# Patient Record
Sex: Female | Born: 1935 | Race: Black or African American | Hispanic: No | Smoking: Never smoker
Health system: Southern US, Community
[De-identification: ages and names within clinical notes are randomized; demographics above are authoritative.]

## PROBLEM LIST (undated history)

## (undated) DIAGNOSIS — E039 Hypothyroidism, unspecified: Secondary | ICD-10-CM

## (undated) DIAGNOSIS — M545 Low back pain, unspecified: Secondary | ICD-10-CM

## (undated) DIAGNOSIS — I10 Essential (primary) hypertension: Secondary | ICD-10-CM

## (undated) DIAGNOSIS — M792 Neuralgia and neuritis, unspecified: Secondary | ICD-10-CM

## (undated) DIAGNOSIS — I4891 Unspecified atrial fibrillation: Secondary | ICD-10-CM

## (undated) HISTORY — PX: APPENDECTOMY: SHX54

## (undated) HISTORY — PX: ABDOMINAL HYSTERECTOMY: SHX81

---

## 2016-11-01 ENCOUNTER — Inpatient Hospital Stay (HOSPITAL_COMMUNITY)
Admission: EM | Admit: 2016-11-01 | Discharge: 2016-11-06 | DRG: 308 | Disposition: A | Discharge status: Home Health | Payer: Medicare HMO | Attending: Internal Medicine | Admitting: Internal Medicine

## 2016-11-01 ENCOUNTER — Emergency Department (HOSPITAL_COMMUNITY): Payer: Medicare HMO

## 2016-11-01 ENCOUNTER — Encounter (HOSPITAL_COMMUNITY): Payer: Self-pay

## 2016-11-01 DIAGNOSIS — I110 Hypertensive heart disease with heart failure: Secondary | ICD-10-CM | POA: Diagnosis present

## 2016-11-01 DIAGNOSIS — I5031 Acute diastolic (congestive) heart failure: Secondary | ICD-10-CM | POA: Diagnosis present

## 2016-11-01 DIAGNOSIS — E039 Hypothyroidism, unspecified: Secondary | ICD-10-CM | POA: Diagnosis present

## 2016-11-01 DIAGNOSIS — Z8249 Family history of ischemic heart disease and other diseases of the circulatory system: Secondary | ICD-10-CM | POA: Diagnosis not present

## 2016-11-01 DIAGNOSIS — R410 Disorientation, unspecified: Secondary | ICD-10-CM | POA: Diagnosis not present

## 2016-11-01 DIAGNOSIS — I4891 Unspecified atrial fibrillation: Secondary | ICD-10-CM | POA: Diagnosis present

## 2016-11-01 DIAGNOSIS — Z90710 Acquired absence of both cervix and uterus: Secondary | ICD-10-CM

## 2016-11-01 DIAGNOSIS — I509 Heart failure, unspecified: Secondary | ICD-10-CM | POA: Diagnosis not present

## 2016-11-01 DIAGNOSIS — E038 Other specified hypothyroidism: Secondary | ICD-10-CM | POA: Diagnosis not present

## 2016-11-01 DIAGNOSIS — E033 Postinfectious hypothyroidism: Secondary | ICD-10-CM

## 2016-11-01 DIAGNOSIS — I10 Essential (primary) hypertension: Secondary | ICD-10-CM | POA: Diagnosis not present

## 2016-11-01 DIAGNOSIS — R531 Weakness: Secondary | ICD-10-CM | POA: Diagnosis not present

## 2016-11-01 DIAGNOSIS — Z79899 Other long term (current) drug therapy: Secondary | ICD-10-CM | POA: Diagnosis not present

## 2016-11-01 HISTORY — DX: Unspecified atrial fibrillation: I48.91

## 2016-11-01 HISTORY — DX: Essential (primary) hypertension: I10

## 2016-11-01 HISTORY — DX: Neuralgia and neuritis, unspecified: M79.2

## 2016-11-01 HISTORY — DX: Low back pain: M54.5

## 2016-11-01 HISTORY — DX: Hypothyroidism, unspecified: E03.9

## 2016-11-01 HISTORY — DX: Low back pain, unspecified: M54.50

## 2016-11-01 LAB — CBC
HEMATOCRIT: 43 % (ref 36.0–46.0)
HEMOGLOBIN: 14.8 g/dL (ref 12.0–15.0)
MCH: 30.7 pg (ref 26.0–34.0)
MCHC: 34.4 g/dL (ref 30.0–36.0)
MCV: 89.2 fL (ref 78.0–100.0)
Platelets: 373 10*3/uL (ref 150–400)
RBC: 4.82 MIL/uL (ref 3.87–5.11)
RDW: 13.1 % (ref 11.5–15.5)
WBC: 15.3 10*3/uL — ABNORMAL HIGH (ref 4.0–10.5)

## 2016-11-01 LAB — BASIC METABOLIC PANEL
ANION GAP: 9 (ref 5–15)
BUN: 12 mg/dL (ref 6–20)
CALCIUM: 8.3 mg/dL — AB (ref 8.9–10.3)
CO2: 29 mmol/L (ref 22–32)
Chloride: 90 mmol/L — ABNORMAL LOW (ref 101–111)
Creatinine, Ser: 0.63 mg/dL (ref 0.44–1.00)
GFR calc Af Amer: >60 mL/min (ref >60)
GFR calc non Af Amer: >60 mL/min (ref >60)
GLUCOSE: 113 mg/dL — AB (ref 65–99)
POTASSIUM: 3.7 mmol/L (ref 3.5–5.1)
Sodium: 128 mmol/L — ABNORMAL LOW (ref 135–145)

## 2016-11-01 LAB — TROPONIN I: Troponin I: <0.03 ng/mL (ref <0.03)

## 2016-11-01 LAB — MRSA PCR SCREENING: MRSA BY PCR: NEGATIVE

## 2016-11-01 LAB — TSH: TSH: 1.695 u[IU]/mL (ref 0.350–4.500)

## 2016-11-01 MED ORDER — ACETAMINOPHEN 325 MG PO TABS
650.0000 mg | ORAL_TABLET | ORAL | Status: DC | PRN
  Administered 2016-11-02 – 2016-11-05 (×6): 650 mg via ORAL
  Filled 2016-11-01 (×6): qty 2

## 2016-11-01 MED ORDER — OMEGA-3-ACID ETHYL ESTERS 1 G PO CAPS
1.0000 g | ORAL_CAPSULE | Freq: Every day | ORAL | Status: DC
  Administered 2016-11-02 – 2016-11-06 (×5): 1 g via ORAL
  Filled 2016-11-01 (×5): qty 1

## 2016-11-01 MED ORDER — SODIUM CHLORIDE 0.9 % IV BOLUS (SEPSIS)
500.0000 mL | Freq: Once | INTRAVENOUS | Status: AC
  Administered 2016-11-01: 500 mL via INTRAVENOUS

## 2016-11-01 MED ORDER — OXYCODONE-ACETAMINOPHEN 5-325 MG PO TABS: 2.0000 | ORAL_TABLET | Freq: Once | ORAL | Status: DC

## 2016-11-01 MED ORDER — ENOXAPARIN SODIUM 40 MG/0.4ML ~~LOC~~ SOLN
40.0000 mg | SUBCUTANEOUS | Status: DC
  Administered 2016-11-01: 40 mg via SUBCUTANEOUS
  Filled 2016-11-01: qty 0.4

## 2016-11-01 MED ORDER — FUROSEMIDE 10 MG/ML IJ SOLN
40.0000 mg | Freq: Two times a day (BID) | INTRAMUSCULAR | Status: DC
  Administered 2016-11-01 – 2016-11-05 (×8): 40 mg via INTRAVENOUS
  Filled 2016-11-01 (×8): qty 4

## 2016-11-01 MED ORDER — SODIUM CHLORIDE 0.9% FLUSH
3.0000 mL | Freq: Two times a day (BID) | INTRAVENOUS | Status: DC
  Administered 2016-11-01 – 2016-11-06 (×9): 3 mL via INTRAVENOUS

## 2016-11-01 MED ORDER — DILTIAZEM HCL-DEXTROSE 100-5 MG/100ML-% IV SOLN (PREMIX)
5.0000 mg/h | INTRAVENOUS | Status: DC
  Administered 2016-11-01: 5 mg/h via INTRAVENOUS
  Filled 2016-11-01: qty 100

## 2016-11-01 MED ORDER — ONDANSETRON HCL 4 MG/2ML IJ SOLN: 4.0000 mg | Freq: Four times a day (QID) | INTRAMUSCULAR | Status: DC | PRN

## 2016-11-01 MED ORDER — SODIUM CHLORIDE 0.9 % IV SOLN: 250.0000 mL | INTRAVENOUS | Status: DC | PRN

## 2016-11-01 MED ORDER — LEVOTHYROXINE SODIUM 50 MCG PO TABS
50.0000 ug | ORAL_TABLET | Freq: Every day | ORAL | Status: DC
  Administered 2016-11-02 – 2016-11-06 (×5): 50 ug via ORAL
  Filled 2016-11-01: qty 2
  Filled 2016-11-01: qty 1
  Filled 2016-11-01 (×2): qty 2
  Filled 2016-11-01: qty 1

## 2016-11-01 MED ORDER — SODIUM CHLORIDE 0.9% FLUSH: 3.0000 mL | INTRAVENOUS | Status: DC | PRN

## 2016-11-01 MED ORDER — DILTIAZEM HCL 25 MG/5ML IV SOLN
10.0000 mg | Freq: Once | INTRAVENOUS | Status: AC
  Administered 2016-11-01: 10 mg via INTRAVENOUS
  Filled 2016-11-01: qty 5

## 2016-11-01 MED ORDER — METHYLDOPA 250 MG PO TABS
250.0000 mg | ORAL_TABLET | Freq: Every day | ORAL | Status: DC
  Administered 2016-11-02 – 2016-11-06 (×5): 250 mg via ORAL
  Filled 2016-11-01 (×6): qty 1

## 2016-11-01 MED ORDER — DILTIAZEM HCL 100 MG IV SOLR
5.0000 mg/h | INTRAVENOUS | Status: DC
  Administered 2016-11-01 – 2016-11-02 (×2): 15 mg/h via INTRAVENOUS
  Filled 2016-11-01 (×2): qty 100

## 2016-11-01 MED ORDER — POTASSIUM CHLORIDE ER 10 MEQ PO TBCR
10.0000 meq | EXTENDED_RELEASE_TABLET | Freq: Every day | ORAL | Status: DC
  Filled 2016-11-01: qty 1

## 2016-11-01 NOTE — ED Triage Notes (Signed)
[facility] new onset of A-Fib and generalized weakness.  Patient reports of weakness x1 week and palpitation.  Denies any pain or shortness of breath.   Per EMS patients heart rate ranges from 108-150.

## 2016-11-01 NOTE — ED Provider Notes (Signed)
[facility]-EMERGENCY DEPT Provider Note   CSN: [id number] Arrival date & time: 11/01/16  1337     History   Chief Complaint Chief Complaint  Patient presents with  . Weakness    HPI Yvonne Chan is a 83 y.o. female with PMH of hypothyroidism and HTN who presents with weakness and new onset Afib found at her PCP's office.   HPI Patient and patient's daughter provided history. Reports of generalized weakness for 1 month with worsening over the past week.She denies any chest pain, palpitations or shortness of breath. No recent illness with cough or rhinorrhea. Denies abdominal pain, n/v, or dysuria, or fevers/chills. She was seen at her PCP today and was noted to be in Afib with RVR; this is a new diagnosis for her.   Past Medical History:  Diagnosis Date  . Atrial fibrillation (HCC)   . Essential hypertension   . Hypothyroidism   . Lumbago   . Neuralgia   . Neuritis     Patient Active Problem List   Diagnosis Date Noted  . Atrial fibrillation with RVR (HCC) 11/01/2016  . HTN (hypertension) 11/01/2016  . Hypothyroidism 11/01/2016  . Acute CHF (congestive heart failure) (HCC) 11/01/2016    Past Surgical History:  Procedure Laterality Date  . ABDOMINAL HYSTERECTOMY    . APPENDECTOMY      OB History    No data available       Home Medications    Prior to Admission medications   Medication Sig Start Date End Date Taking? Authorizing Provider  amLODipine (NORVASC) 10 MG tablet Take 10 mg by mouth daily.   Yes Historical Provider, MD  hydrochlorothiazide (HYDRODIURIL) 25 MG tablet Take 25 mg by mouth daily.   Yes Historical Provider, MD  levothyroxine (SYNTHROID, LEVOTHROID) 50 MCG tablet Take 50 mcg by mouth daily before breakfast.   Yes Historical Provider, MD  methyldopa (ALDOMET) 250 MG tablet Take 250 mg by mouth daily.   Yes Historical Provider, MD  naproxen sodium (ALEVE) 220 MG tablet Take 220-440 mg by mouth daily as needed (for pain).   Yes Historical Provider, MD   Omega-3 Fatty Acids (FISH OIL) 1000 MG CAPS Take 1 capsule by mouth daily.   Yes Historical Provider, MD  potassium chloride (K-DUR) 10 MEQ tablet Take 10 mEq by mouth daily.   Yes Historical Provider, MD    Family History No family history on file.  Social History Social History  Substance Use Topics  . Smoking status: Never Smoker  . Smokeless tobacco: Never Used  . Alcohol use No     Allergies   Patient has no known allergies.   Review of Systems Review of Systems  Constitutional: Negative for chills and fever.  HENT: Negative for rhinorrhea and sore throat.   Respiratory: Negative for cough, chest tightness, shortness of breath and wheezing.   Cardiovascular: Positive for leg swelling. Negative for chest pain and palpitations.  Gastrointestinal: Negative for abdominal pain, diarrhea, nausea and vomiting.  Genitourinary: Negative for dysuria.  Neurological: Positive for weakness (generalized weakness/fatigue). Negative for dizziness, syncope, light-headedness and headaches.     Physical Exam Updated Vital Signs BP 117/81   Pulse 120   Temp 97.7 F (36.5 C) (Oral)   Resp 18   Ht 5\' 6"  (1.676 m)   Wt 112.4 kg   SpO2 95%   BMI 40.00 kg/m   Physical Exam  Constitutional: She is oriented to person, place, and time. She appears well-developed and well-nourished. No distress.  HENT:  Mouth/Throat: Oropharynx is clear and moist.  Eyes: Conjunctivae are normal.  Neck: Normal range of motion. Neck supple. No JVD present.  Cardiovascular: Intact distal pulses.  Exam reveals no gallop and no friction rub.   No murmur heard. Tachycardia initially up to 151. Irregularly irregular.   Pulmonary/Chest: Effort normal and breath sounds normal. No respiratory distress. She has no wheezes. She has no rales.  Abdominal: Soft. Bowel sounds are normal. She exhibits no distension. There is no tenderness. There is no guarding.  Musculoskeletal: She exhibits edema (pitting edema in  lower extremities bilaterally ).  Neurological: She is alert and oriented to person, place, and time. A cranial nerve deficit is present.  Skin: Skin is warm and dry. Capillary refill takes less than 2 seconds.     ED Treatments / Results  Labs (all labs ordered are listed, but only abnormal results are displayed) Labs Reviewed  BASIC METABOLIC PANEL - Abnormal; Notable for the following:       Result Value   Sodium 128 (*)    Chloride 90 (*)    Glucose, Bld 113 (*)    Calcium 8.3 (*)    All other components within normal limits  CBC - Abnormal; Notable for the following:    WBC 15.3 (*)    All other components within normal limits  MRSA PCR SCREENING  TSH  TROPONIN I  BASIC METABOLIC PANEL  CBC  CREATININE, SERUM    EKG  EKG Interpretation  Date/Time:  Thursday November 01 2016 13:48:51 EST Ventricular Rate:  129 PR Interval:    QRS Duration: 91 QT Interval:  368 QTC Calculation: 540 R Axis:   61 Text Interpretation:  Atrial fibrillation Low voltage, precordial leads Repolarization abnormality, prob rate related Prolonged QT interval No STEMI.  Confirmed by LONG MD, JOSHUA [phone]) on 11/01/2016 1:59:48 PM       Radiology Dg Chest Portable 1 View  Result Date: 11/01/2016 CLINICAL DATA:  Generalized weakness. New onset atrial fibrillation. EXAM: PORTABLE CHEST 1 VIEW COMPARISON:  None. FINDINGS: Study is limited by the patient's size and portable technique. There is cardiomegaly and pulmonary vascular congestion. Mild left basilar atelectasis is noted. No pneumothorax or pleural effusion. IMPRESSION: Cardiomegaly and pulmonary vascular congestion. Electronically Signed   By: Drusilla Kanner M.D.   On: 11/01/2016 14:14    Procedures Procedures (including critical care time)  Medications Ordered in ED Medications  levothyroxine (SYNTHROID, LEVOTHROID) tablet 50 mcg (not administered)  methyldopa (ALDOMET) tablet 250 mg (not administered)  omega-3 acid ethyl esters  (LOVAZA) capsule 1 g (not administered)  potassium chloride (K-DUR) CR tablet 10 mEq (not administered)  sodium chloride flush (NS) 0.9 % injection 3 mL (3 mLs Intravenous Given 11/01/16 2037)  sodium chloride flush (NS) 0.9 % injection 3 mL (not administered)  0.9 %  sodium chloride infusion (not administered)  acetaminophen (TYLENOL) tablet 650 mg (not administered)  ondansetron (ZOFRAN) injection 4 mg (not administered)  enoxaparin (LOVENOX) injection 40 mg (40 mg Subcutaneous Given 11/01/16 2036)  furosemide (LASIX) injection 40 mg (40 mg Intravenous Given 11/01/16 2036)  diltiazem (CARDIZEM) 100 mg in dextrose 5 % 100 mL (1 mg/mL) infusion (15 mg/hr Intravenous Restarted 11/01/16 1900)  sodium chloride 0.9 % bolus 500 mL (0 mLs Intravenous Stopped 11/01/16 1550)  diltiazem (CARDIZEM) injection 10 mg (10 mg Intravenous Given 11/01/16 1452)     Initial Impression / Assessment and Plan / ED Course  I have reviewed the triage vital signs and the nursing  notes.  Pertinent labs & imaging results that were available during my care of the patient were reviewed by me and considered in my medical decision making (see chart for details).  Diltiazem bolus given with good response of HR to the 80s-90s to low 100s.   3:28PM Spoke with Cardiology Dr. Diona Browner, who recommended admission to [facility] and starting Diltiazem infusion.   3:35PM: discussed with admitting physician.    Final Clinical Impressions(s) / ED Diagnoses   Final diagnoses:  None    New Prescriptions Current Discharge Medication List       Palma Holter, MD 11/01/16 2142    Maia Plan, MD 11/02/16 1015

## 2016-11-01 NOTE — ED Notes (Signed)
ED Provider at bedside. 

## 2016-11-01 NOTE — H&P (Signed)
History and Physical    Yvonne BellingMary Colasurdo ZOX:[id number] DOB: 1928-10-23 DOA: 11/01/2016  Referring MD/NP/PA: Alona BeneJoshua long, EDP PCP: Abran RichardBAUCOM, JENNY B, PA-C  Patient coming from: Home  Chief Complaint: Weakness, lower extremity edema  HPI: Yvonne BellingMary Kardell is a 83 y.o. female with history of hypertension and hypothyroidism who has been feeling weak for the past 2-3 weeks has also been experiencing lower extremity edema. She went to see her PCP today for a scheduled appointment where she was found to be in A. fib with RVR and sent to the emergency department. In the ED she was given a Cardizem bolus and started on a Cardizem drip with improved heart rate, maintains A. fib which is a new rhythm for her. She was also noted to be markedly volume overloaded with 3-4+ a lateral pedal edema. Admission has been requested for further evaluation and management.  Past Medical/Surgical History: Past Medical History:  Diagnosis Date  . Atrial fibrillation (HCC)   . Essential hypertension   . Hypothyroidism   . Lumbago   . Neuralgia   . Neuritis     Past Surgical History:  Procedure Laterality Date  . ABDOMINAL HYSTERECTOMY    . APPENDECTOMY      Social History:  reports that she has never smoked. She has never used smokeless tobacco. She reports that she does not drink alcohol or use drugs.  Allergies: No Known Allergies  Family History:  Definite heart failure and her sister, hypertension in multiple family members   Prior to Admission medications   Medication Sig Start Date End Date Taking? Authorizing Provider  amLODipine (NORVASC) 10 MG tablet Take 10 mg by mouth daily.   Yes Historical Provider, MD  hydrochlorothiazide (HYDRODIURIL) 25 MG tablet Take 25 mg by mouth daily.   Yes Historical Provider, MD  levothyroxine (SYNTHROID, LEVOTHROID) 50 MCG tablet Take 50 mcg by mouth daily before breakfast.   Yes Historical Provider, MD  methyldopa (ALDOMET) 250 MG tablet Take 250 mg by mouth daily.   Yes  Historical Provider, MD  naproxen sodium (ALEVE) 220 MG tablet Take 220-440 mg by mouth daily as needed (for pain).   Yes Historical Provider, MD  Omega-3 Fatty Acids (FISH OIL) 1000 MG CAPS Take 1 capsule by mouth daily.   Yes Historical Provider, MD  potassium chloride (K-DUR) 10 MEQ tablet Take 10 mEq by mouth daily.   Yes Historical Provider, MD    Review of Systems:  Constitutional: Denies fever, chills, diaphoresis, appetite change and fatigue.  HEENT: Denies photophobia, eye pain, redness, hearing loss, ear pain, congestion, sore throat, rhinorrhea, sneezing, mouth sores, trouble swallowing, neck pain, neck stiffness and tinnitus.   Respiratory: Denies SOB, DOE, cough, chest tightness,  and wheezing.   Cardiovascular: Denies chest pain, palpitations and leg swelling.  Gastrointestinal: Denies nausea, vomiting, abdominal pain, diarrhea, constipation, blood in stool and abdominal distention.  Genitourinary: Denies dysuria, urgency, frequency, hematuria, flank pain and difficulty urinating.  Endocrine: Denies: hot or cold intolerance, sweats, changes in hair or nails, polyuria, polydipsia. Musculoskeletal: Denies myalgias, back pain, joint swelling, arthralgias and gait problem.  Skin: Denies pallor, rash and wound.  Neurological: Denies dizziness, seizures, syncope, weakness, light-headedness, numbness and headaches.  Hematological: Denies adenopathy. Easy bruising, personal or family bleeding history  Psychiatric/Behavioral: Denies suicidal ideation, mood changes, confusion, nervousness, sleep disturbance and agitation    Physical Exam: Vitals:   11/01/16 1500 11/01/16 1600 11/01/16 1630 11/01/16 1754  BP: (!) 112/54 123/86 134/82 131/73  Pulse: 100 (!) 145 (!)  138 112  Resp: 23 21 20 24   Temp:      TempSrc:      SpO2: 95% 96% 100% 96%  Weight:      Height:         Constitutional: NAD, calm, comfortable Eyes: PERRL, lids and conjunctivae normal ENMT: Mucous membranes are  moist. Posterior pharynx clear of any exudate or lesions.Normal dentition.  Neck: normal, supple, no masses, no thyromegaly Respiratory: Mild bibasilar crackles Cardiovascular:  irregular, fast rhythm, no murmurs, rubs or gallops identified on auscultation Abdomen: no tenderness, no masses palpated. No hepatosplenomegaly. Bowel sounds positive.  Musculoskeletal:  3-4+ pitting edema bilaterally Skin: no rashes, lesions, ulcers. No induration Neurologic: CN 2-12 grossly intact. Sensation intact, DTR normal. Strength 5/5 in all 4.  Psychiatric: Normal judgment and insight. Alert and oriented x 3. Normal mood.    Labs on Admission: I have personally reviewed the following labs and imaging studies  CBC:  Recent Labs Lab 11/01/16 1416  WBC 15.3*  HGB 14.8  HCT 43.0  MCV 89.2  PLT 373   Basic Metabolic Panel:  Recent Labs Lab 11/01/16 1416  NA 128*  K 3.7  CL 90*  CO2 29  GLUCOSE 113*  BUN 12  CREATININE 0.63  CALCIUM 8.3*   GFR: Estimated Creatinine Clearance: 65.5 mL/min (by C-G formula based on SCr of 0.63 mg/dL). Liver Function Tests: No results for input(s): AST, ALT, ALKPHOS, BILITOT, PROT, ALBUMIN in the last 168 hours. No results for input(s): LIPASE, AMYLASE in the last 168 hours. No results for input(s): AMMONIA in the last 168 hours. Coagulation Profile: No results for input(s): INR, PROTIME in the last 168 hours. Cardiac Enzymes:  Recent Labs Lab 11/01/16 1416  TROPONINI <0.03   BNP (last 3 results) No results for input(s): PROBNP in the last 8760 hours. HbA1C: No results for input(s): HGBA1C in the last 72 hours. CBG: No results for input(s): GLUCAP in the last 168 hours. Lipid Profile: No results for input(s): CHOL, HDL, LDLCALC, TRIG, CHOLHDL, LDLDIRECT in the last 72 hours. Thyroid Function Tests:  Recent Labs  11/01/16 1417  TSH 1.695   Anemia Panel: No results for input(s): VITAMINB12, FOLATE, FERRITIN, TIBC, IRON, RETICCTPCT in the last  72 hours. Urine analysis: No results found for: COLORURINE, APPEARANCEUR, LABSPEC, PHURINE, GLUCOSEU, HGBUR, BILIRUBINUR, KETONESUR, PROTEINUR, UROBILINOGEN, NITRITE, LEUKOCYTESUR Sepsis Labs: @LABRCNTIP (procalcitonin:4,lacticidven:4) )No results found for this or any previous visit (from the past 240 hour(s)).   Radiological Exams on Admission: Dg Chest Portable 1 View  Result Date: 11/01/2016 CLINICAL DATA:  Generalized weakness. New onset atrial fibrillation. EXAM: PORTABLE CHEST 1 VIEW COMPARISON:  None. FINDINGS: Study is limited by the patient's size and portable technique. There is cardiomegaly and pulmonary vascular congestion. Mild left basilar atelectasis is noted. No pneumothorax or pleural effusion. IMPRESSION: Cardiomegaly and pulmonary vascular congestion. Electronically Signed   By: Drusilla Kanner M.D.   On: 11/01/2016 14:14    EKG: Independently reviewed.  atrial fibrillation with rapid ventricular response  Assessment/Plan Principal Problem:   Atrial fibrillation with RVR (HCC) Active Problems:   HTN (hypertension)   Hypothyroidism   Acute CHF (congestive heart failure) (HCC)    Atrial fibrillation with rapid ventricular response -This is of new onset. -Has been given a Cardizem bolus and started on a Cardizem drip with improved rates. Will continue Cardizem drip overnight. -She has a CHADSVASC2 score of at least 4 and may be 5 if echo confirms CHF, she is definitely a candidate for anticoagulation. -  Have discussed this briefly with son and daughter at bedside, for now we'll place on prophylactic doses of Lovenox and will continue conversations regarding anticoagulation in a.m. -2-D echo requested.  Acute CHF, type unknown -New onset, likely diastolic. -Strict intake and output, daily weights, strive for negative fluid balance. -Start on Lasix 40 mg IV twice daily. -Pending echo results may need to consider initiation of ACE inhibitor plus minus beta blocker. For  now we'll concentrate on diuretic and calcium channel blocker for rapid A. Fib.  Hypertension -Well-controlled -Continue home medications  Hypothyroidism -Check TSH, continue home dose of Synthroid   DVT prophylaxis: Lovenox  Code Status: Full code  Family Communication: Son and daughter at bedside updated on plan of care and all questions answered  Disposition Plan: Discharge home in approximately 3-4 days anticipated  Consults called: None  Admission status: Inpatient    Time Spent: 85 minutes  Chaya Jan MD [facility] Pager [phone]  If 7PM-7AM, please contact night-coverage [email or web address] Password [facility]  11/01/2016, 6:07 PM

## 2016-11-02 ENCOUNTER — Inpatient Hospital Stay (HOSPITAL_COMMUNITY): Payer: Medicare HMO

## 2016-11-02 DIAGNOSIS — I10 Essential (primary) hypertension: Secondary | ICD-10-CM

## 2016-11-02 DIAGNOSIS — I509 Heart failure, unspecified: Secondary | ICD-10-CM

## 2016-11-02 DIAGNOSIS — E038 Other specified hypothyroidism: Secondary | ICD-10-CM

## 2016-11-02 LAB — BASIC METABOLIC PANEL
Anion gap: 9 (ref 5–15)
BUN: 10 mg/dL (ref 6–20)
CHLORIDE: 89 mmol/L — AB (ref 101–111)
CO2: 30 mmol/L (ref 22–32)
Calcium: 7.8 mg/dL — ABNORMAL LOW (ref 8.9–10.3)
Creatinine, Ser: 0.54 mg/dL (ref 0.44–1.00)
GFR calc Af Amer: >60 mL/min (ref >60)
GFR calc non Af Amer: >60 mL/min (ref >60)
GLUCOSE: 127 mg/dL — AB (ref 65–99)
POTASSIUM: 3.2 mmol/L — AB (ref 3.5–5.1)
Sodium: 128 mmol/L — ABNORMAL LOW (ref 135–145)

## 2016-11-02 LAB — ECHOCARDIOGRAM COMPLETE
Height: 66 in
Weight: 3904.79 oz

## 2016-11-02 MED ORDER — POTASSIUM CHLORIDE CRYS ER 10 MEQ PO TBCR
10.0000 meq | EXTENDED_RELEASE_TABLET | Freq: Every day | ORAL | Status: DC
  Administered 2016-11-02 – 2016-11-06 (×5): 10 meq via ORAL
  Filled 2016-11-02 (×5): qty 1

## 2016-11-02 MED ORDER — APIXABAN 5 MG PO TABS
5.0000 mg | ORAL_TABLET | Freq: Two times a day (BID) | ORAL | Status: DC
Start: 2016-11-02 — End: 2016-11-06
  Administered 2016-11-02 – 2016-11-06 (×9): 5 mg via ORAL
  Filled 2016-11-02 (×9): qty 1

## 2016-11-02 MED ORDER — DILTIAZEM HCL 30 MG PO TABS
30.0000 mg | ORAL_TABLET | Freq: Three times a day (TID) | ORAL | Status: DC
  Administered 2016-11-02 – 2016-11-03 (×4): 30 mg via ORAL
  Filled 2016-11-02 (×4): qty 1

## 2016-11-02 NOTE — Care Management Note (Signed)
Case Management Note  Patient Details  Name: Yvonne Chan MRN: [id number] Date of Birth: 1928/11/02  Subjective/Objective:  Patient adm with Afib with RVR. From home with family, sons live with her, daughter checks in daily and helps with cooking. Patient has [facility] PTA. She has PCP, daughter drives her to appointments and she reports no issues affording medications.                   Action/Plan: Patient started on Eliquis this admission. CM provided daughter at bedside with coupon for 30 trial of Eliquis ($10 copay) and aware that PCP will need to prescribe and do any prior auths thereafter if required.    Expected Discharge Date:       11/03/2016           Expected Discharge Plan:  Home/Self Care  In-House Referral:  NA  Discharge planning Services  CM Consult  Post Acute Care Choice:  NA Choice offered to:  NA  DME Arranged:    DME Agency:     HH Arranged:    HH Agency:     Status of Service:  In process, will continue to follow  If discussed at Long Length of Stay Meetings, dates discussed:    Additional Comments:  Lanier Felty, Chrystine OilerSharley Diane, RN 11/02/2016, 4:14 PM

## 2016-11-02 NOTE — Progress Notes (Signed)
ANTICOAGULATION CONSULT NOTE - Initial Consult  Pharmacy Consult for APIXABAN Indication: atrial fibrillation  No Known Allergies  Patient Measurements: Height: 5\' 6"  (167.6 cm) Weight: 244 lb 0.8 oz (110.7 kg) IBW/kg (Calculated) : 59.3  Vital Signs: Temp: 98.2 F (36.8 C) (01/26 0900) Temp Source: Oral (01/26 0900) BP: 116/69 (01/26 0830) Pulse Rate: 86 (01/26 0830)  Labs:  Recent Labs  11/01/16 1416 11/02/16 0306  HGB 14.8  --   HCT 43.0  --   PLT 373  --   CREATININE 0.63 0.54  TROPONINI <0.03  --     Estimated Creatinine Clearance: 64.8 mL/min (by C-G formula based on SCr of 0.54 mg/dL).   Medical History: Past Medical History:  Diagnosis Date  . Atrial fibrillation (HCC)   . Essential hypertension   . Hypothyroidism   . Lumbago   . Neuralgia   . Neuritis     Medications:  Prescriptions Prior to Admission  Medication Sig Dispense Refill Last Dose  . amLODipine (NORVASC) 10 MG tablet Take 10 mg by mouth daily.   11/01/2016 at Unknown time  . hydrochlorothiazide (HYDRODIURIL) 25 MG tablet Take 25 mg by mouth daily.   11/01/2016 at Unknown time  . levothyroxine (SYNTHROID, LEVOTHROID) 50 MCG tablet Take 50 mcg by mouth daily before breakfast.   11/01/2016 at Unknown time  . methyldopa (ALDOMET) 250 MG tablet Take 250 mg by mouth daily.   11/01/2016 at Unknown time  . naproxen sodium (ALEVE) 220 MG tablet Take 220-440 mg by mouth daily as needed (for pain).   11/01/2016 at Unknown time  . Omega-3 Fatty Acids (FISH OIL) 1000 MG CAPS Take 1 capsule by mouth daily.   11/01/2016 at Unknown time  . potassium chloride (K-DUR) 10 MEQ tablet Take 10 mEq by mouth daily.   11/01/2016 at Unknown time    Assessment: 83yo female found to have new onset afib with RVR.  Asked to initiate Apixaban Goal of Therapy:  Stroke prevention Monitor platelets by anticoagulation protocol: Yes   Plan:  Apixaban 5mg  po BID Provide education Monitor for s/sx bleeding  complications  Valrie HartHall, Jahaira Earnhart A 11/02/2016,10:20 AM

## 2016-11-02 NOTE — Discharge Instructions (Signed)

## 2016-11-02 NOTE — Progress Notes (Signed)
PROGRESS NOTE    Yvonne Chan  ZOX:[id number] DOB: November 24, 1928 DOA: 11/01/2016 PCP: Phyllis GingerBAUCOM, JENNY B, PA-C     Brief Narrative:  83 y/o woman admitted from home on 1/25 with weakness and LE edema, found to have new onset a fib with RVR and acute CHF. Admission requested.   Assessment & Plan:   Principal Problem:   Atrial fibrillation with RVR (HCC) Active Problems:   HTN (hypertension)   Hypothyroidism   Acute CHF (congestive heart failure) (HCC)   A Fib with RVR -Rate currently controlled on Cardizem drip, will transition over to by mouth. -Has a CHADSVASC 2 score of at least 4 maybe 5 depending on echo results, certainly would benefit from anticoagulation. Have discussed with patient and family and have decided to start Eliquis.  Acute CHF, type unknown, presumed diastolic -This would be a new diagnosis, echo pending. -Still markedly volume overloaded on exam, plan to continue Lasix 40 mg IV twice daily and strive for negative fluid balance. -She is so far 2.8 L negative since admission. -Consider initiation of ACE inhibitor/beta blocker pending echo results.  HTN -Well-controlled, continue home medications.  Hypothyroidism -TSH is within normal limits at 1.695. -Continue home dose of Synthroid.   DVT prophylaxis: eliquis Code Status: Full code Family Communication: Son and daughter at bedside Disposition Plan: Transfer to floor  Consultants:   None  Procedures:   None  Antimicrobials:  Anti-infectives    None       Subjective: Feels well, still weak  Objective: Vitals:   11/02/16 0800 11/02/16 0815 11/02/16 0830 11/02/16 0900  BP: 106/65 127/82 116/69   Pulse: 81 94 86   Resp: 18 19 18    Temp:    98.2 F (36.8 C)  TempSrc:    Oral  SpO2: 93% 95% 95%   Weight:      Height:        Intake/Output Summary (Last 24 hours) at 11/02/16 0934 Last data filed at 11/02/16 0900  Gross per 24 hour  Intake           969.84 ml  Output             3800  ml  Net         -2830.16 ml   Filed Weights   11/01/16 1346 11/01/16 1840 11/02/16 0449  Weight: 112.9 kg (249 lb) 112.4 kg (247 lb 12.8 oz) 110.7 kg (244 lb 0.8 oz)    Examination:  General exam: Alert, awake, oriented x 3 Respiratory system: Clear to auscultation. Respiratory effort normal. Cardiovascular system:Irregular rhythm, rate controlled, no murmurs, rubs or gallops appreciated Gastrointestinal system: Abdomen is nondistended, soft and nontender. No organomegaly or masses felt. Normal bowel sounds heard. Central nervous system: Alert and oriented. No focal neurological deficits. Extremities: 3+ pitting edema bilaterally Skin: No rashes, lesions or ulcers Psychiatry: Judgement and insight appear normal. Mood & affect appropriate.     Data Reviewed: I have personally reviewed following labs and imaging studies  CBC:  Recent Labs Lab 11/01/16 1416  WBC 15.3*  HGB 14.8  HCT 43.0  MCV 89.2  PLT 373   Basic Metabolic Panel:  Recent Labs Lab 11/01/16 1416 11/02/16 0306  NA 128* 128*  K 3.7 3.2*  CL 90* 89*  CO2 29 30  GLUCOSE 113* 127*  BUN 12 10  CREATININE 0.63 0.54  CALCIUM 8.3* 7.8*   GFR: Estimated Creatinine Clearance: 64.8 mL/min (by C-G formula based on SCr of 0.54 mg/dL). Liver Function Tests:  No results for input(s): AST, ALT, ALKPHOS, BILITOT, PROT, ALBUMIN in the last 168 hours. No results for input(s): LIPASE, AMYLASE in the last 168 hours. No results for input(s): AMMONIA in the last 168 hours. Coagulation Profile: No results for input(s): INR, PROTIME in the last 168 hours. Cardiac Enzymes:  Recent Labs Lab 11/01/16 1416  TROPONINI <0.03   BNP (last 3 results) No results for input(s): PROBNP in the last 8760 hours. HbA1C: No results for input(s): HGBA1C in the last 72 hours. CBG: No results for input(s): GLUCAP in the last 168 hours. Lipid Profile: No results for input(s): CHOL, HDL, LDLCALC, TRIG, CHOLHDL, LDLDIRECT in the last  72 hours. Thyroid Function Tests:  Recent Labs  11/01/16 1417  TSH 1.695   Anemia Panel: No results for input(s): VITAMINB12, FOLATE, FERRITIN, TIBC, IRON, RETICCTPCT in the last 72 hours. Urine analysis: No results found for: COLORURINE, APPEARANCEUR, LABSPEC, PHURINE, GLUCOSEU, HGBUR, BILIRUBINUR, KETONESUR, PROTEINUR, UROBILINOGEN, NITRITE, LEUKOCYTESUR Sepsis Labs: @LABRCNTIP (procalcitonin:4,lacticidven:4)  ) Recent Results (from the past 240 hour(s))  MRSA PCR Screening     Status: None   Collection Time: 11/01/16  6:37 PM  Result Value Ref Range Status   MRSA by PCR NEGATIVE NEGATIVE Final    Comment:        The GeneXpert MRSA Assay (FDA approved for NASAL specimens only), is one component of a comprehensive MRSA colonization surveillance program. It is not intended to diagnose MRSA infection nor to guide or monitor treatment for MRSA infections.          Radiology Studies: Dg Chest Portable 1 View  Result Date: 11/01/2016 CLINICAL DATA:  Generalized weakness. New onset atrial fibrillation. EXAM: PORTABLE CHEST 1 VIEW COMPARISON:  None. FINDINGS: Study is limited by the patient's size and portable technique. There is cardiomegaly and pulmonary vascular congestion. Mild left basilar atelectasis is noted. No pneumothorax or pleural effusion. IMPRESSION: Cardiomegaly and pulmonary vascular congestion. Electronically Signed   By: Drusilla Kanner M.D.   On: 11/01/2016 14:14        Scheduled Meds: . diltiazem  30 mg Oral Q8H  . furosemide  40 mg Intravenous BID  . levothyroxine  50 mcg Oral QAC breakfast  . methyldopa  250 mg Oral Daily  . omega-3 acid ethyl esters  1 g Oral Daily  . potassium chloride  10 mEq Oral Daily  . sodium chloride flush  3 mL Intravenous Q12H   Continuous Infusions: . diltiazem (CARDIZEM) infusion 10 mg/hr (11/02/16 0850)     LOS: 1 day    Time spent: 25 minutes. Greater than 50% of this time was spent in direct contact with  the patient coordinating care.     Chaya Jan, MD [facility] Pager [phone]  If 7PM-7AM, please contact night-coverage [email or web address] Password [facility] 11/02/2016, 9:34 AM

## 2016-11-02 NOTE — Progress Notes (Signed)
*  PRELIMINARY RESULTS* Echocardiogram 2D Echocardiogram has been performed.  Yvonne Chan, Yvonne Chan 11/02/2016, 10:13 AM

## 2016-11-03 DIAGNOSIS — I5031 Acute diastolic (congestive) heart failure: Secondary | ICD-10-CM

## 2016-11-03 LAB — BASIC METABOLIC PANEL
Anion gap: 11 (ref 5–15)
BUN: 13 mg/dL (ref 6–20)
CHLORIDE: 86 mmol/L — AB (ref 101–111)
CO2: 31 mmol/L (ref 22–32)
Calcium: 7.6 mg/dL — ABNORMAL LOW (ref 8.9–10.3)
Creatinine, Ser: 0.7 mg/dL (ref 0.44–1.00)
GFR calc Af Amer: >60 mL/min (ref >60)
GFR calc non Af Amer: >60 mL/min (ref >60)
Glucose, Bld: 107 mg/dL — ABNORMAL HIGH (ref 65–99)
POTASSIUM: 3.2 mmol/L — AB (ref 3.5–5.1)
SODIUM: 128 mmol/L — AB (ref 135–145)

## 2016-11-03 LAB — GLUCOSE, CAPILLARY: GLUCOSE-CAPILLARY: 118 mg/dL — AB (ref 65–99)

## 2016-11-03 MED ORDER — METOPROLOL TARTRATE 25 MG PO TABS
25.0000 mg | ORAL_TABLET | Freq: Two times a day (BID) | ORAL | Status: DC
  Administered 2016-11-03 – 2016-11-06 (×7): 25 mg via ORAL
  Filled 2016-11-03 (×7): qty 1

## 2016-11-03 MED ORDER — POLYETHYLENE GLYCOL 3350 17 G PO PACK
17.0000 g | PACK | Freq: Every day | ORAL | Status: DC
  Administered 2016-11-03 – 2016-11-04 (×2): 17 g via ORAL
  Filled 2016-11-03 (×4): qty 1

## 2016-11-03 NOTE — Progress Notes (Signed)
RN went into room and found patient very disoriented. She was pulling at covers and wires. She could not answer questions appropriately. Patient was disoriented to time, situation and place.   RN noticed patient O2 was 84.  CBG 118  Placed patient on 2.5L [place]. Patient O2 sat came up to 95.   Patient is now oriented and resting well  Genelle Balameron D Ameliana Brashear, RN

## 2016-11-03 NOTE — Progress Notes (Signed)
PROGRESS NOTE    Yvonne BellingMary Chan  ZOX:[id number]:[id number] DOB: [birth year] DOA: 11/01/2016 PCP: Phyllis GingerBAUCOM, JENNY B, PA-C     Brief Narrative:  83 y/o woman admitted from home on 1/25 with weakness and LE edema, found to have new onset a fib with RVR and acute CHF. Admission requested. Episode of delirium night 1/26-1/27.   Assessment & Plan:   Principal Problem:   Atrial fibrillation with RVR (HCC) Active Problems:   HTN (hypertension)   Hypothyroidism   Acute CHF (congestive heart failure) (HCC)   A Fib with RVR -Has a CHADSVASC 2 score of at least 4 maybe 5 depending on echo results, certainly would benefit from anticoagulation. -Have discussed with patient and family and have decided to start Eliquis. -Rate is controlled on cardizem; given her new diastolic CHF, will transition to metoprolol 25 mg BID.  Acute Diastolic CHF. -New diagnosis. -ECHO with EF 60-65% and moderate LVH; unable to assess LV diastolic function due to a fib. -Still markedly volume overloaded on exam, plan to continue Lasix 40 mg IV twice daily and strive for negative fluid balance. -She is so far 4.9 L negative since admission.  HTN -Well-controlled, continue home medications.  Hypothyroidism -TSH is within normal limits at 1.695. -Continue home dose of Synthroid.  Hospital Delirium -Is currently at baseline; monitor and avoid sedating drugs if possible.   DVT prophylaxis: eliquis Code Status: Full code Family Communication: Son and daughter at bedside 1/26. Disposition Plan: Transfer to floor  Consultants:   None  Procedures:   None  Antimicrobials:  Anti-infectives    None       Subjective: Feels well, still weak  Objective: Vitals:   11/03/16 0200 11/03/16 0400 11/03/16 0600 11/03/16 0800  BP: 122/80 130/86 121/83   Pulse: 99 (!) 108 95   Resp: (!) 22 20 19    Temp:  97.7 F (36.5 C)  98 F (36.7 C)  TempSrc:  Oral  Oral  SpO2: 93% 90% 95%   Weight:  109.4 kg (241 lb 2.9 oz)      Height:        Intake/Output Summary (Last 24 hours) at 11/03/16 0855 Last data filed at 11/03/16 0400  Gross per 24 hour  Intake           713.75 ml  Output             2550 ml  Net         -1836.25 ml   Filed Weights   11/01/16 1840 11/02/16 0449 11/03/16 0400  Weight: 112.4 kg (247 lb 12.8 oz) 110.7 kg (244 lb 0.8 oz) 109.4 kg (241 lb 2.9 oz)    Examination:  General exam: Alert, awake, oriented x 3 Respiratory system: Clear to auscultation. Respiratory effort normal. Cardiovascular system:Irregular rhythm, rate controlled, no murmurs, rubs or gallops appreciated Gastrointestinal system: Abdomen is nondistended, soft and nontender. No organomegaly or masses felt. Normal bowel sounds heard. Central nervous system: Alert and oriented. No focal neurological deficits. Extremities: 3+ pitting edema bilaterally Skin: No rashes, lesions or ulcers Psychiatry: Judgement and insight appear normal. Mood & affect appropriate.     Data Reviewed: I have personally reviewed following labs and imaging studies  CBC:  Recent Labs Lab 11/01/16 1416  WBC 15.3*  HGB 14.8  HCT 43.0  MCV 89.2  PLT 373   Basic Metabolic Panel:  Recent Labs Lab 11/01/16 1416 11/02/16 0306 11/03/16 0525  NA 128* 128* 128*  K 3.7 3.2* 3.2*  CL 90*  89* 86*  CO2 29 30 31   GLUCOSE 113* 127* 107*  BUN 12 10 13   CREATININE 0.63 0.54 0.70  CALCIUM 8.3* 7.8* 7.6*   GFR: Estimated Creatinine Clearance: 64.4 mL/min (by C-G formula based on SCr of 0.7 mg/dL). Liver Function Tests: No results for input(s): AST, ALT, ALKPHOS, BILITOT, PROT, ALBUMIN in the last 168 hours. No results for input(s): LIPASE, AMYLASE in the last 168 hours. No results for input(s): AMMONIA in the last 168 hours. Coagulation Profile: No results for input(s): INR, PROTIME in the last 168 hours. Cardiac Enzymes:  Recent Labs Lab 11/01/16 1416  TROPONINI <0.03   BNP (last 3 results) No results for input(s): PROBNP in the  last 8760 hours. HbA1C: No results for input(s): HGBA1C in the last 72 hours. CBG:  Recent Labs Lab 11/03/16 0626  GLUCAP 118*   Lipid Profile: No results for input(s): CHOL, HDL, LDLCALC, TRIG, CHOLHDL, LDLDIRECT in the last 72 hours. Thyroid Function Tests:  Recent Labs  11/01/16 1417  TSH 1.695   Anemia Panel: No results for input(s): VITAMINB12, FOLATE, FERRITIN, TIBC, IRON, RETICCTPCT in the last 72 hours. Urine analysis: No results found for: COLORURINE, APPEARANCEUR, LABSPEC, PHURINE, GLUCOSEU, HGBUR, BILIRUBINUR, KETONESUR, PROTEINUR, UROBILINOGEN, NITRITE, LEUKOCYTESUR Sepsis Labs: @LABRCNTIP (procalcitonin:4,lacticidven:4)  ) Recent Results (from the past 240 hour(s))  MRSA PCR Screening     Status: None   Collection Time: 11/01/16  6:37 PM  Result Value Ref Range Status   MRSA by PCR NEGATIVE NEGATIVE Final    Comment:        The GeneXpert MRSA Assay (FDA approved for NASAL specimens only), is one component of a comprehensive MRSA colonization surveillance program. It is not intended to diagnose MRSA infection nor to guide or monitor treatment for MRSA infections.          Radiology Studies: Dg Chest Portable 1 View  Result Date: 11/01/2016 CLINICAL DATA:  Generalized weakness. New onset atrial fibrillation. EXAM: PORTABLE CHEST 1 VIEW COMPARISON:  None. FINDINGS: Study is limited by the patient's size and portable technique. There is cardiomegaly and pulmonary vascular congestion. Mild left basilar atelectasis is noted. No pneumothorax or pleural effusion. IMPRESSION: Cardiomegaly and pulmonary vascular congestion. Electronically Signed   By: Drusilla Kanner M.D.   On: 11/01/2016 14:14        Scheduled Meds: . apixaban  5 mg Oral BID  . diltiazem  30 mg Oral Q8H  . furosemide  40 mg Intravenous BID  . levothyroxine  50 mcg Oral QAC breakfast  . methyldopa  250 mg Oral Daily  . omega-3 acid ethyl esters  1 g Oral Daily  . polyethylene  glycol  17 g Oral Daily  . potassium chloride  10 mEq Oral Daily  . sodium chloride flush  3 mL Intravenous Q12H   Continuous Infusions:    LOS: 2 days    Time spent: 25 minutes. Greater than 50% of this time was spent in direct contact with the patient coordinating care.     Chaya Jan, MD [facility] Pager [phone]  If 7PM-7AM, please contact night-coverage [email or web address] Password [facility] 11/03/2016, 8:55 AM

## 2016-11-04 LAB — BASIC METABOLIC PANEL
Anion gap: 9 (ref 5–15)
BUN: 14 mg/dL (ref 6–20)
CHLORIDE: 84 mmol/L — AB (ref 101–111)
CO2: 33 mmol/L — AB (ref 22–32)
Calcium: 7.8 mg/dL — ABNORMAL LOW (ref 8.9–10.3)
Creatinine, Ser: 0.67 mg/dL (ref 0.44–1.00)
GFR calc Af Amer: >60 mL/min (ref >60)
GFR calc non Af Amer: >60 mL/min (ref >60)
GLUCOSE: 109 mg/dL — AB (ref 65–99)
POTASSIUM: 3.3 mmol/L — AB (ref 3.5–5.1)
Sodium: 126 mmol/L — ABNORMAL LOW (ref 135–145)

## 2016-11-04 NOTE — Progress Notes (Signed)
PROGRESS NOTE    Yvonne Chan  ZOX:[id number] DOB: 1929/07/24 DOA: 11/01/2016 PCP: Phyllis Ginger     Brief Narrative:  83 y/o woman admitted from home on 1/25 with weakness and LE edema, found to have new onset a fib with RVR and acute CHF. Admission requested. Episode of delirium night 1/26-1/27.   Assessment & Plan:   Principal Problem:   Atrial fibrillation with RVR (HCC) Active Problems:   HTN (hypertension)   Hypothyroidism   Acute diastolic CHF (congestive heart failure) (HCC)   A Fib with RVR -Has a CHADSVASC 2 score of 5 depending on echo results, certainly would benefit from anticoagulation.  -Have discussed with patient and family and have decided to start Eliquis. -Rate is controlled on cardizem; given her new diastolic CHF, will transition to metoprolol 25 mg BID. Rate is currently in the low 100s.   Acute Diastolic CHF. -New diagnosis. -ECHO with EF 60-65% and moderate LVH; unable to assess LV diastolic function due to a fib. -Still markedly volume overloaded on exam, altho improved from admission, plan to continue Lasix 40 mg IV twice daily and strive for negative fluid balance. -She is so far 5.5 L negative since admission.  HTN -Well-controlled, continue home medications.  Hypothyroidism -TSH is within normal limits at 1.695. -Continue home dose of Synthroid.  Hospital Delirium -Is currently at baseline; monitor and avoid sedating drugs if possible.   DVT prophylaxis: eliquis Code Status: Full code Family Communication: Son and daughter at bedside 1/26. Disposition Plan: Transfer to floor  Consultants:   None  Procedures:   None  Antimicrobials:  Anti-infectives    None       Subjective: Feels well, still weak  Objective: Vitals:   11/04/16 0500 11/04/16 0600 11/04/16 0700 11/04/16 0800  BP:  91/79    Pulse: 89 90 90   Resp: (!) 21 20 (!) 21   Temp:    98 F (36.7 C)  TempSrc:      SpO2: 94% 90% 92%   Weight: 110.2 kg  (242 lb 15.2 oz)     Height:        Intake/Output Summary (Last 24 hours) at 11/04/16 1022 Last data filed at 11/04/16 0500  Gross per 24 hour  Intake              943 ml  Output             1575 ml  Net             -632 ml   Filed Weights   11/02/16 0449 11/03/16 0400 11/04/16 0500  Weight: 110.7 kg (244 lb 0.8 oz) 109.4 kg (241 lb 2.9 oz) 110.2 kg (242 lb 15.2 oz)    Examination:  General exam: Alert, awake, oriented x 3 Respiratory system: Clear to auscultation. Respiratory effort normal. Cardiovascular system:Irregular rhythm, rate controlled, no murmurs, rubs or gallops appreciated Gastrointestinal system: Abdomen is nondistended, soft and nontender. No organomegaly or masses felt. Normal bowel sounds heard. Central nervous system: Alert and oriented. No focal neurological deficits. Extremities: 3+ pitting edema bilaterally Skin: No rashes, lesions or ulcers Psychiatry: Judgement and insight appear normal. Mood & affect appropriate.     Data Reviewed: I have personally reviewed following labs and imaging studies  CBC:  Recent Labs Lab 11/01/16 1416  WBC 15.3*  HGB 14.8  HCT 43.0  MCV 89.2  PLT 373   Basic Metabolic Panel:  Recent Labs Lab 11/01/16 1416 11/02/16 0306 11/03/16 0525  11/04/16 0617  NA 128* 128* 128* 126*  K 3.7 3.2* 3.2* 3.3*  CL 90* 89* 86* 84*  CO2 29 30 31  33*  GLUCOSE 113* 127* 107* 109*  BUN 12 10 13 14   CREATININE 0.63 0.54 0.70 0.67  CALCIUM 8.3* 7.8* 7.6* 7.8*   GFR: Estimated Creatinine Clearance: 64.7 mL/min (by C-G formula based on SCr of 0.67 mg/dL). Liver Function Tests: No results for input(s): AST, ALT, ALKPHOS, BILITOT, PROT, ALBUMIN in the last 168 hours. No results for input(s): LIPASE, AMYLASE in the last 168 hours. No results for input(s): AMMONIA in the last 168 hours. Coagulation Profile: No results for input(s): INR, PROTIME in the last 168 hours. Cardiac Enzymes:  Recent Labs Lab 11/01/16 1416  TROPONINI  <0.03   BNP (last 3 results) No results for input(s): PROBNP in the last 8760 hours. HbA1C: No results for input(s): HGBA1C in the last 72 hours. CBG:  Recent Labs Lab 11/03/16 0626  GLUCAP 118*   Lipid Profile: No results for input(s): CHOL, HDL, LDLCALC, TRIG, CHOLHDL, LDLDIRECT in the last 72 hours. Thyroid Function Tests:  Recent Labs  11/01/16 1417  TSH 1.695   Anemia Panel: No results for input(s): VITAMINB12, FOLATE, FERRITIN, TIBC, IRON, RETICCTPCT in the last 72 hours. Urine analysis: No results found for: COLORURINE, APPEARANCEUR, LABSPEC, PHURINE, GLUCOSEU, HGBUR, BILIRUBINUR, KETONESUR, PROTEINUR, UROBILINOGEN, NITRITE, LEUKOCYTESUR Sepsis Labs: @LABRCNTIP (procalcitonin:4,lacticidven:4)  ) Recent Results (from the past 240 hour(s))  MRSA PCR Screening     Status: None   Collection Time: 11/01/16  6:37 PM  Result Value Ref Range Status   MRSA by PCR NEGATIVE NEGATIVE Final    Comment:        The GeneXpert MRSA Assay (FDA approved for NASAL specimens only), is one component of a comprehensive MRSA colonization surveillance program. It is not intended to diagnose MRSA infection nor to guide or monitor treatment for MRSA infections.          Radiology Studies: No results found.      Scheduled Meds: . apixaban  5 mg Oral BID  . furosemide  40 mg Intravenous BID  . levothyroxine  50 mcg Oral QAC breakfast  . methyldopa  250 mg Oral Daily  . metoprolol tartrate  25 mg Oral BID  . omega-3 acid ethyl esters  1 g Oral Daily  . polyethylene glycol  17 g Oral Daily  . potassium chloride  10 mEq Oral Daily  . sodium chloride flush  3 mL Intravenous Q12H   Continuous Infusions:    LOS: 3 days    Time spent: 25 minutes. Greater than 50% of this time was spent in direct contact with the patient coordinating care.     Chaya JanHERNANDEZ ACOSTA,ESTELA, MD [facility] Pager [phone]  If 7PM-7AM, please contact  night-coverage [email or web address] Password [email or web address] 11/04/2016, 10:22 AM

## 2016-11-05 LAB — BASIC METABOLIC PANEL
Anion gap: 8 (ref 5–15)
BUN: 13 mg/dL (ref 6–20)
CALCIUM: 7.7 mg/dL — AB (ref 8.9–10.3)
CHLORIDE: 82 mmol/L — AB (ref 101–111)
CO2: 34 mmol/L — ABNORMAL HIGH (ref 22–32)
CREATININE: 0.61 mg/dL (ref 0.44–1.00)
GFR calc non Af Amer: >60 mL/min (ref >60)
Glucose, Bld: 107 mg/dL — ABNORMAL HIGH (ref 65–99)
Potassium: 3.2 mmol/L — ABNORMAL LOW (ref 3.5–5.1)
SODIUM: 124 mmol/L — AB (ref 135–145)

## 2016-11-05 MED ORDER — POTASSIUM CHLORIDE CRYS ER 20 MEQ PO TBCR
40.0000 meq | EXTENDED_RELEASE_TABLET | Freq: Once | ORAL | Status: AC
  Administered 2016-11-05: 40 meq via ORAL
  Filled 2016-11-05: qty 2

## 2016-11-05 MED ORDER — FUROSEMIDE 40 MG PO TABS
40.0000 mg | ORAL_TABLET | Freq: Every day | ORAL | Status: DC
  Administered 2016-11-05 – 2016-11-06 (×2): 40 mg via ORAL
  Filled 2016-11-05 (×2): qty 1

## 2016-11-05 NOTE — Progress Notes (Signed)
PROGRESS NOTE    Yvonne Chan  WUJ:[id number] DOB: 05-19-1929 DOA: 11/01/2016 PCP: Phyllis Ginger     Brief Narrative:  83 y/o woman admitted from home on 1/25 with weakness and LE edema, found to have new onset a fib with RVR and acute CHF. Admission requested. Episode of delirium night 1/26-1/27.   Assessment & Plan:   Principal Problem:   Atrial fibrillation with RVR (HCC) Active Problems:   HTN (hypertension)   Hypothyroidism   Acute diastolic CHF (congestive heart failure) (HCC)   A Fib with RVR -Has a CHADSVASC 2 score of 5.  -Have discussed with patient and family and have decided to start Eliquis. -Rate is controlled on cardizem; given her new diastolic CHF, will transition to metoprolol 25 mg BID. Rate is currently in the low 100s.   Acute Diastolic CHF. -New diagnosis. -ECHO with EF 60-65% and moderate LVH; unable to assess LV diastolic function due to a fib. -Volume status is improved, will transition to oral Lasix, continue to strive for negative fluid balance. -She is so far 6.0 L negative since admission. -Discontinue Foley catheter  HTN -Well-controlled, continue home medications.  Hypothyroidism -TSH is within normal limits at 1.695. -Continue home dose of Synthroid.  Hospital Delirium -Is currently at baseline; monitor and avoid sedating drugs if possible.   DVT prophylaxis: eliquis Code Status: Full code Family Communication: Son and daughter at bedside 1/26. Disposition Plan: Transfer to floor  Consultants:   None  Procedures:   None  Antimicrobials:  Anti-infectives    None       Subjective: Feels well, still weak  Objective: Vitals:   11/04/16 1638 11/04/16 2150 11/05/16 0700 11/05/16 1400  BP: 107/72 124/76 108/79 104/70  Pulse: (!) 105 81 99 72  Resp: 19 20 20  (!) 24  Temp: 99 F (37.2 C) 98.2 F (36.8 C) 98.7 F (37.1 C) 98.5 F (36.9 C)  TempSrc: Oral Oral Oral Oral  SpO2: 98% 99% 93% 99%  Weight:   107.6  kg (237 lb 3.4 oz)   Height:        Intake/Output Summary (Last 24 hours) at 11/05/16 1528 Last data filed at 11/05/16 1525  Gross per 24 hour  Intake              243 ml  Output             1775 ml  Net            -1532 ml   Filed Weights   11/03/16 0400 11/04/16 0500 11/05/16 0700  Weight: 109.4 kg (241 lb 2.9 oz) 110.2 kg (242 lb 15.2 oz) 107.6 kg (237 lb 3.4 oz)    Examination:  General exam: Alert, awake, oriented x 3 Respiratory system: Clear to auscultation. Respiratory effort normal. Cardiovascular system:Irregular rhythm, rate controlled, no murmurs, rubs or gallops appreciated Gastrointestinal system: Abdomen is nondistended, soft and nontender. No organomegaly or masses felt. Normal bowel sounds heard. Central nervous system: Alert and oriented. No focal neurological deficits. Extremities: 3+ pitting edema bilaterally Skin: No rashes, lesions or ulcers Psychiatry: Judgement and insight appear normal. Mood & affect appropriate.     Data Reviewed: I have personally reviewed following labs and imaging studies  CBC:  Recent Labs Lab 11/01/16 1416  WBC 15.3*  HGB 14.8  HCT 43.0  MCV 89.2  PLT 373   Basic Metabolic Panel:  Recent Labs Lab 11/01/16 1416 11/02/16 0306 11/03/16 0525 11/04/16 0617 11/05/16 0717  NA 128*  128* 128* 126* 124*  K 3.7 3.2* 3.2* 3.3* 3.2*  CL 90* 89* 86* 84* 82*  CO2 29 30 31  33* 34*  GLUCOSE 113* 127* 107* 109* 107*  BUN 12 10 13 14 13   CREATININE 0.63 0.54 0.70 0.67 0.61  CALCIUM 8.3* 7.8* 7.6* 7.8* 7.7*   GFR: Estimated Creatinine Clearance: 63.8 mL/min (by C-G formula based on SCr of 0.61 mg/dL). Liver Function Tests: No results for input(s): AST, ALT, ALKPHOS, BILITOT, PROT, ALBUMIN in the last 168 hours. No results for input(s): LIPASE, AMYLASE in the last 168 hours. No results for input(s): AMMONIA in the last 168 hours. Coagulation Profile: No results for input(s): INR, PROTIME in the last 168 hours. Cardiac  Enzymes:  Recent Labs Lab 11/01/16 1416  TROPONINI <0.03   BNP (last 3 results) No results for input(s): PROBNP in the last 8760 hours. HbA1C: No results for input(s): HGBA1C in the last 72 hours. CBG:  Recent Labs Lab 11/03/16 0626  GLUCAP 118*   Lipid Profile: No results for input(s): CHOL, HDL, LDLCALC, TRIG, CHOLHDL, LDLDIRECT in the last 72 hours. Thyroid Function Tests: No results for input(s): TSH, T4TOTAL, FREET4, T3FREE, THYROIDAB in the last 72 hours. Anemia Panel: No results for input(s): VITAMINB12, FOLATE, FERRITIN, TIBC, IRON, RETICCTPCT in the last 72 hours. Urine analysis: No results found for: COLORURINE, APPEARANCEUR, LABSPEC, PHURINE, GLUCOSEU, HGBUR, BILIRUBINUR, KETONESUR, PROTEINUR, UROBILINOGEN, NITRITE, LEUKOCYTESUR Sepsis Labs: @LABRCNTIP (procalcitonin:4,lacticidven:4)  ) Recent Results (from the past 240 hour(s))  MRSA PCR Screening     Status: None   Collection Time: 11/01/16  6:37 PM  Result Value Ref Range Status   MRSA by PCR NEGATIVE NEGATIVE Final    Comment:        The GeneXpert MRSA Assay (FDA approved for NASAL specimens only), is one component of a comprehensive MRSA colonization surveillance program. It is not intended to diagnose MRSA infection nor to guide or monitor treatment for MRSA infections.          Radiology Studies: No results found.      Scheduled Meds: . apixaban  5 mg Oral BID  . furosemide  40 mg Oral Daily  . levothyroxine  50 mcg Oral QAC breakfast  . methyldopa  250 mg Oral Daily  . metoprolol tartrate  25 mg Oral BID  . omega-3 acid ethyl esters  1 g Oral Daily  . polyethylene glycol  17 g Oral Daily  . potassium chloride  10 mEq Oral Daily  . sodium chloride flush  3 mL Intravenous Q12H   Continuous Infusions:    LOS: 4 days    Time spent: 25 minutes. Greater than 50% of this time was spent in direct contact with the patient coordinating care.     Chaya JanHERNANDEZ ACOSTA,ESTELA,  MD [facility] Pager [phone]  If 7PM-7AM, please contact night-coverage [email or web address] Password [email or web address] 11/05/2016, 3:28 PM

## 2016-11-05 NOTE — Progress Notes (Signed)
ANTICOAGULATION CONSULT NOTE   Pharmacy Consult for APIXABAN Indication: atrial fibrillation  No Known Allergies  Patient Measurements: Height: 5\' 6"  (167.6 cm) Weight: 237 lb 3.4 oz (107.6 kg) IBW/kg (Calculated) : 59.3  Vital Signs: Temp: 98.7 F (37.1 C) (01/29 0700) Temp Source: Oral (01/29 0700) BP: 108/79 (01/29 0700) Pulse Rate: 99 (01/29 0700)  Labs:  Recent Labs  11/03/16 0525 11/04/16 0617 11/05/16 0717  CREATININE 0.70 0.67 0.61   Estimated Creatinine Clearance: 63.8 mL/min (by C-G formula based on SCr of 0.61 mg/dL).  Medical History: Past Medical History:  Diagnosis Date  . Atrial fibrillation (HCC)   . Essential hypertension   . Hypothyroidism   . Lumbago   . Neuralgia   . Neuritis    Medications:  Prescriptions Prior to Admission  Medication Sig Dispense Refill Last Dose  . amLODipine (NORVASC) 10 MG tablet Take 10 mg by mouth daily.   11/01/2016 at Unknown time  . hydrochlorothiazide (HYDRODIURIL) 25 MG tablet Take 25 mg by mouth daily.   11/01/2016 at Unknown time  . levothyroxine (SYNTHROID, LEVOTHROID) 50 MCG tablet Take 50 mcg by mouth daily before breakfast.   11/01/2016 at Unknown time  . methyldopa (ALDOMET) 250 MG tablet Take 250 mg by mouth daily.   11/01/2016 at Unknown time  . naproxen sodium (ALEVE) 220 MG tablet Take 220-440 mg by mouth daily as needed (for pain).   11/01/2016 at Unknown time  . Omega-3 Fatty Acids (FISH OIL) 1000 MG CAPS Take 1 capsule by mouth daily.   11/01/2016 at Unknown time  . potassium chloride (K-DUR) 10 MEQ tablet Take 10 mEq by mouth daily.   11/01/2016 at Unknown time   Assessment: 83yo female found to have new onset afib with RVR.  Asked to initiate Apixaban  Goal of Therapy:  Stroke prevention Monitor platelets by anticoagulation protocol: Yes   Plan:  Apixaban 5mg  po BID Monitor for s/sx bleeding complications, CBC  Kaycee Haycraft A 11/05/2016,11:21 AM

## 2016-11-06 LAB — CBC
HEMATOCRIT: 42.1 % (ref 36.0–46.0)
Hemoglobin: 14.3 g/dL (ref 12.0–15.0)
MCH: 29.8 pg (ref 26.0–34.0)
MCHC: 34 g/dL (ref 30.0–36.0)
MCV: 87.7 fL (ref 78.0–100.0)
Platelets: 413 10*3/uL — ABNORMAL HIGH (ref 150–400)
RBC: 4.8 MIL/uL (ref 3.87–5.11)
RDW: 12.9 % (ref 11.5–15.5)
WBC: 12.1 10*3/uL — ABNORMAL HIGH (ref 4.0–10.5)

## 2016-11-06 LAB — BASIC METABOLIC PANEL
Anion gap: 9 (ref 5–15)
BUN: 13 mg/dL (ref 6–20)
CALCIUM: 7.8 mg/dL — AB (ref 8.9–10.3)
CO2: 32 mmol/L (ref 22–32)
Chloride: 81 mmol/L — ABNORMAL LOW (ref 101–111)
Creatinine, Ser: 0.6 mg/dL (ref 0.44–1.00)
GFR calc Af Amer: >60 mL/min (ref >60)
GLUCOSE: 109 mg/dL — AB (ref 65–99)
Potassium: 3.6 mmol/L (ref 3.5–5.1)
Sodium: 122 mmol/L — ABNORMAL LOW (ref 135–145)

## 2016-11-06 MED ORDER — METOPROLOL TARTRATE 25 MG PO TABS: 25.0000 mg | ORAL_TABLET | Freq: Two times a day (BID) | ORAL | 2 refills | Status: DC

## 2016-11-06 MED ORDER — FUROSEMIDE 40 MG PO TABS: 40.0000 mg | ORAL_TABLET | Freq: Every day | ORAL | 2 refills | Status: DC

## 2016-11-06 MED ORDER — APIXABAN 5 MG PO TABS: 5.0000 mg | ORAL_TABLET | Freq: Two times a day (BID) | ORAL | 2 refills | Status: DC

## 2016-11-06 NOTE — Care Management Important Message (Signed)
Important Message  Patient Details  Name: Yvonne Chan MRN: [id number] Date of Birth: 01-27-1929   Medicare Important Message Given:  Yes    Malcolm MetroChildress, Deandrea Rion Demske, RN 11/06/2016, 12:51 PM

## 2016-11-06 NOTE — Care Management (Signed)
Pt requesting [place]. Order placed by MD, printed and pt instructed to take order to DME agency of choice. Pt's son at bedside and verbalized understanding.

## 2016-11-06 NOTE — Discharge Summary (Signed)
Physician Discharge Summary  Yvonne Chan UJW:[id number] DOB: [birth year] DOA: 11/01/2016  PCP: Abran RichardBAUCOM, JENNY B, PA-C  Admit date: 11/01/2016 Discharge date: 11/06/2016  Time spent: 45 minutes  Recommendations for Outpatient Follow-up:  -To be discharged home today. -Advised to follow-up with primary care provider in 1-2 weeks. Should be referred to cardiology.   Discharge Diagnoses:  Principal Problem:   Atrial fibrillation with RVR (HCC) Active Problems:   HTN (hypertension)   Hypothyroidism   Acute diastolic CHF (congestive heart failure) (HCC)   Discharge Condition: Stable and improved  Filed Weights   11/03/16 0400 11/04/16 0500 11/05/16 0700  Weight: 109.4 kg (241 lb 2.9 oz) 110.2 kg (242 lb 15.2 oz) 107.6 kg (237 lb 3.4 oz)    History of present illness:  Yvonne Chan is a 83 y.o. female with history of hypertension and hypothyroidism who has been feeling weak for the past 2-3 weeks has also been experiencing lower extremity edema. She went to see her PCP today for a scheduled appointment where she was found to be in A. fib with RVR and sent to the emergency department. In the ED she was given a Cardizem bolus and started on a Cardizem drip with improved heart rate, maintains A. fib which is a new rhythm for her. She was also noted to be markedly volume overloaded with 3-4+ a lateral pedal edema. Admission has been requested for further evaluation and management.  Hospital Course:   A Fib with RVR -Has a CHADSVASC 2 score of 5.  -Have discussed with patient and family and have decided to start Eliquis. -Rate is controlled on cardizem; given her new diastolic CHF, will transition to metoprolol 25 mg BID. Rate is currently in the low 100s.   Acute Diastolic CHF. -New diagnosis. -ECHO with EF 60-65% and moderate LVH; unable to assess LV diastolic function due to a fib. -Volume status is improved, will transition to oral Lasix, continue to strive for negative fluid  balance. -She is so far 6.0 L negative since admission.  HTN -Well-controlled, continue home medications.  Hypothyroidism -TSH is within normal limits at 1.695. -Continue home dose of Synthroid.  Hospital Delirium -Is currently at baseline; monitor and avoid sedating drugs if possible.   Procedures:  None   Consultations:  None  Discharge Instructions  Discharge Instructions    Diet - low sodium heart healthy    Complete by:  As directed    Increase activity slowly    Complete by:  As directed      Allergies as of 11/06/2016   No Known Allergies     Medication List    STOP taking these medications   ALEVE 220 MG tablet Generic drug:  naproxen sodium   amLODipine 10 MG tablet Commonly known as:  NORVASC   hydrochlorothiazide 25 MG tablet Commonly known as:  HYDRODIURIL     TAKE these medications   apixaban 5 MG Tabs tablet Commonly known as:  ELIQUIS Take 1 tablet (5 mg total) by mouth 2 (two) times daily.   Fish Oil 1000 MG Caps Take 1 capsule by mouth daily.   furosemide 40 MG tablet Commonly known as:  LASIX Take 1 tablet (40 mg total) by mouth daily. Start taking on:  11/07/2016   levothyroxine 50 MCG tablet Commonly known as:  SYNTHROID, LEVOTHROID Take 50 mcg by mouth daily before breakfast.   methyldopa 250 MG tablet Commonly known as:  ALDOMET Take 250 mg by mouth daily.   metoprolol tartrate  25 MG tablet Commonly known as:  LOPRESSOR Take 1 tablet (25 mg total) by mouth 2 (two) times daily.   potassium chloride 10 MEQ tablet Commonly known as:  K-DUR Take 10 mEq by mouth daily.            Durable Medical Equipment        Start     Ordered   11/06/16 1355  For home use only DME [place] rolling  Once    Comments:  Ht 5'6" Wt' 237lbs  Question:  Patient needs a [place] to treat with the following condition  Answer:  Heart failure (HCC)   11/06/16 1356     No Known Allergies Follow-up Information    [facility] Follow up.   Specialty:  Home Health Services Contact information: [address] [place] [address] [phone]            The results of significant diagnostics from this hospitalization (including imaging, microbiology, ancillary and laboratory) are listed below for reference.    Significant Diagnostic Studies: Dg Chest Portable 1 View  Result Date: 11/01/2016 CLINICAL DATA:  Generalized weakness. New onset atrial fibrillation. EXAM: PORTABLE CHEST 1 VIEW COMPARISON:  None. FINDINGS: Study is limited by the patient's size and portable technique. There is cardiomegaly and pulmonary vascular congestion. Mild left basilar atelectasis is noted. No pneumothorax or pleural effusion. IMPRESSION: Cardiomegaly and pulmonary vascular congestion. Electronically Signed   By: Drusilla Kanner M.D.   On: 11/01/2016 14:14    Microbiology: Recent Results (from the past 240 hour(s))  MRSA PCR Screening     Status: None   Collection Time: 11/01/16  6:37 PM  Result Value Ref Range Status   MRSA by PCR NEGATIVE NEGATIVE Final    Comment:        The GeneXpert MRSA Assay (FDA approved for NASAL specimens only), is one component of a comprehensive MRSA colonization surveillance program. It is not intended to diagnose MRSA infection nor to guide or monitor treatment for MRSA infections.      Labs: Basic Metabolic Panel:  Recent Labs Lab 11/02/16 0306 11/03/16 0525 11/04/16 0617 11/05/16 0717 11/06/16 0521  NA 128* 128* 126* 124* 122*  K 3.2* 3.2* 3.3* 3.2* 3.6  CL 89* 86* 84* 82* 81*  CO2 30 31 33* 34* 32  GLUCOSE 127* 107* 109* 107* 109*  BUN 10 13 14 13 13   CREATININE 0.54 0.70 0.67 0.61 0.60  CALCIUM 7.8* 7.6* 7.8* 7.7* 7.8*   Liver Function Tests: No results for input(s): AST, ALT, ALKPHOS, BILITOT, PROT, ALBUMIN in the last 168 hours. No results for input(s): LIPASE, AMYLASE in the last 168 hours. No results for input(s): AMMONIA in the last 168  hours. CBC:  Recent Labs Lab 11/01/16 1416 11/06/16 0521  WBC 15.3* 12.1*  HGB 14.8 14.3  HCT 43.0 42.1  MCV 89.2 87.7  PLT 373 413*   Cardiac Enzymes:  Recent Labs Lab 11/01/16 1416  TROPONINI <0.03   BNP: BNP (last 3 results) No results for input(s): BNP in the last 8760 hours.  ProBNP (last 3 results) No results for input(s): PROBNP in the last 8760 hours.  CBG:  Recent Labs Lab 11/03/16 0626  GLUCAP 118*       Signed:  Chaya Jan  [facility] Pager: [phone] 11/06/2016, 4:59 PM

## 2016-11-06 NOTE — Progress Notes (Signed)
Pt discharged in stable condition into the care of her family via wheelchair via private vehicle.  PIV removed intact and w/o S&S of complications. Discharge instructions reviewed with pt.  Pt verbalized understanding.  Prescriptions given to pt.

## 2016-11-06 NOTE — Care Management Note (Signed)
Case Management Note  Patient Details  Name: Yvonne Chan MRN: [id number] Date of Birth: 1929/03/13   Expected Discharge Date:  11/06/16               Expected Discharge Plan:  Home w Home Health Services  In-House Referral:  NA  Discharge planning Services  CM Consult  Post Acute Care Choice:  Home Health Choice offered to:  Patient, Adult Children  HH Arranged:  RN, PT, Nurse's Aide HH Agency:  [facility]  Status of Service:  Completed, signed off  If discussed at Long Length of Stay Meetings, dates discussed:  11/06/2016  Additional Comments: Pt discharging home today. She will need HH PT, RN, aid services. [facility]. [facility]. Kandee Keenory, of [facility] is aware of referral and will obtain pt info from chart. Pt assessed and does not meet requirements for supplemental oxygen. Pt ambulates with a cane. Pt has no DME needs. Pt's son at bedside to transport home.  Malcolm Metrohildress, Gabreille Dardis Demske, RN 11/06/2016, 12:52 PM

## 2016-11-07 ENCOUNTER — Encounter (HOSPITAL_COMMUNITY): Payer: Self-pay | Admitting: Emergency Medicine

## 2016-11-07 ENCOUNTER — Emergency Department (HOSPITAL_COMMUNITY): Payer: Medicare HMO

## 2016-11-07 ENCOUNTER — Inpatient Hospital Stay (HOSPITAL_COMMUNITY)
Admission: EM | Admit: 2016-11-07 | Discharge: 2016-11-16 | DRG: 640 | Disposition: A | Discharge status: Skilled Nursing Facility | Payer: Medicare HMO | Attending: Family Medicine | Admitting: Family Medicine

## 2016-11-07 DIAGNOSIS — I10 Essential (primary) hypertension: Secondary | ICD-10-CM | POA: Diagnosis not present

## 2016-11-07 DIAGNOSIS — Z79899 Other long term (current) drug therapy: Secondary | ICD-10-CM

## 2016-11-07 DIAGNOSIS — Z7901 Long term (current) use of anticoagulants: Secondary | ICD-10-CM | POA: Diagnosis not present

## 2016-11-07 DIAGNOSIS — I5031 Acute diastolic (congestive) heart failure: Secondary | ICD-10-CM | POA: Diagnosis present

## 2016-11-07 DIAGNOSIS — T502X5A Adverse effect of carbonic-anhydrase inhibitors, benzothiadiazides and other diuretics, initial encounter: Secondary | ICD-10-CM | POA: Diagnosis present

## 2016-11-07 DIAGNOSIS — R739 Hyperglycemia, unspecified: Secondary | ICD-10-CM | POA: Diagnosis not present

## 2016-11-07 DIAGNOSIS — R1310 Dysphagia, unspecified: Secondary | ICD-10-CM | POA: Diagnosis present

## 2016-11-07 DIAGNOSIS — E039 Hypothyroidism, unspecified: Secondary | ICD-10-CM | POA: Diagnosis present

## 2016-11-07 DIAGNOSIS — I4891 Unspecified atrial fibrillation: Secondary | ICD-10-CM | POA: Diagnosis present

## 2016-11-07 DIAGNOSIS — E871 Hypo-osmolality and hyponatremia: Principal | ICD-10-CM

## 2016-11-07 DIAGNOSIS — I509 Heart failure, unspecified: Secondary | ICD-10-CM

## 2016-11-07 DIAGNOSIS — N390 Urinary tract infection, site not specified: Secondary | ICD-10-CM | POA: Diagnosis not present

## 2016-11-07 DIAGNOSIS — J9601 Acute respiratory failure with hypoxia: Secondary | ICD-10-CM | POA: Diagnosis present

## 2016-11-07 DIAGNOSIS — I110 Hypertensive heart disease with heart failure: Secondary | ICD-10-CM | POA: Diagnosis present

## 2016-11-07 DIAGNOSIS — R630 Anorexia: Secondary | ICD-10-CM | POA: Diagnosis present

## 2016-11-07 DIAGNOSIS — J111 Influenza due to unidentified influenza virus with other respiratory manifestations: Secondary | ICD-10-CM | POA: Diagnosis not present

## 2016-11-07 DIAGNOSIS — E876 Hypokalemia: Secondary | ICD-10-CM | POA: Diagnosis present

## 2016-11-07 DIAGNOSIS — G9341 Metabolic encephalopathy: Secondary | ICD-10-CM | POA: Diagnosis present

## 2016-11-07 DIAGNOSIS — R05 Cough: Secondary | ICD-10-CM | POA: Diagnosis present

## 2016-11-07 DIAGNOSIS — R531 Weakness: Secondary | ICD-10-CM

## 2016-11-07 LAB — URINALYSIS, ROUTINE W REFLEX MICROSCOPIC
BILIRUBIN URINE: NEGATIVE
GLUCOSE, UA: NEGATIVE mg/dL
HGB URINE DIPSTICK: NEGATIVE
Ketones, ur: NEGATIVE mg/dL
Leukocytes, UA: NEGATIVE
Nitrite: NEGATIVE
PH: 7 (ref 5.0–8.0)
Protein, ur: NEGATIVE mg/dL
SPECIFIC GRAVITY, URINE: 1.013 (ref 1.005–1.030)

## 2016-11-07 LAB — CBC WITH DIFFERENTIAL/PLATELET
Basophils Absolute: 0 10*3/uL (ref 0.0–0.1)
Basophils Relative: 0 %
Eosinophils Absolute: 0.1 10*3/uL (ref 0.0–0.7)
Eosinophils Relative: 1 %
HCT: 40.2 % (ref 36.0–46.0)
HEMOGLOBIN: 14 g/dL (ref 12.0–15.0)
LYMPHS ABS: 0.6 10*3/uL — AB (ref 0.7–4.0)
Lymphocytes Relative: 4 %
MCH: 30.4 pg (ref 26.0–34.0)
MCHC: 34.8 g/dL (ref 30.0–36.0)
MCV: 87.4 fL (ref 78.0–100.0)
MONOS PCT: 4 %
Monocytes Absolute: 0.5 10*3/uL (ref 0.1–1.0)
NEUTROS PCT: 91 %
Neutro Abs: 13 10*3/uL — ABNORMAL HIGH (ref 1.7–7.7)
Platelets: 360 10*3/uL (ref 150–400)
RBC: 4.6 MIL/uL (ref 3.87–5.11)
RDW: 12.5 % (ref 11.5–15.5)
WBC: 14.3 10*3/uL — ABNORMAL HIGH (ref 4.0–10.5)

## 2016-11-07 LAB — HEPATIC FUNCTION PANEL
ALT: 17 U/L (ref 14–54)
AST: 37 U/L (ref 15–41)
Albumin: 2.4 g/dL — ABNORMAL LOW (ref 3.5–5.0)
Alkaline Phosphatase: 56 U/L (ref 38–126)
BILIRUBIN DIRECT: 0.3 mg/dL (ref 0.1–0.5)
BILIRUBIN INDIRECT: 0.7 mg/dL (ref 0.3–0.9)
TOTAL PROTEIN: 6.7 g/dL (ref 6.5–8.1)
Total Bilirubin: 1 mg/dL (ref 0.3–1.2)

## 2016-11-07 LAB — BASIC METABOLIC PANEL
Anion gap: 8 (ref 5–15)
BUN: 11 mg/dL (ref 6–20)
CALCIUM: 7.6 mg/dL — AB (ref 8.9–10.3)
CO2: 31 mmol/L (ref 22–32)
Chloride: 80 mmol/L — ABNORMAL LOW (ref 101–111)
Creatinine, Ser: 0.64 mg/dL (ref 0.44–1.00)
GFR calc Af Amer: >60 mL/min (ref >60)
GLUCOSE: 121 mg/dL — AB (ref 65–99)
POTASSIUM: 3.5 mmol/L (ref 3.5–5.1)
Sodium: 119 mmol/L — CL (ref 135–145)

## 2016-11-07 LAB — BRAIN NATRIURETIC PEPTIDE: B Natriuretic Peptide: 630 pg/mL — ABNORMAL HIGH (ref 0.0–100.0)

## 2016-11-07 LAB — TROPONIN I: Troponin I: <0.03 ng/mL (ref <0.03)

## 2016-11-07 MED ORDER — ACETAMINOPHEN 325 MG PO TABS
650.0000 mg | ORAL_TABLET | Freq: Four times a day (QID) | ORAL | Status: DC | PRN
  Administered 2016-11-08 – 2016-11-09 (×3): 650 mg via ORAL
  Filled 2016-11-07 (×3): qty 2

## 2016-11-07 MED ORDER — METHYLDOPA 250 MG PO TABS
250.0000 mg | ORAL_TABLET | Freq: Every day | ORAL | Status: DC
  Filled 2016-11-07 (×2): qty 1

## 2016-11-07 MED ORDER — DOCUSATE SODIUM 100 MG PO CAPS
100.0000 mg | ORAL_CAPSULE | Freq: Two times a day (BID) | ORAL | Status: DC
  Administered 2016-11-07 – 2016-11-16 (×18): 100 mg via ORAL
  Filled 2016-11-07 (×18): qty 1

## 2016-11-07 MED ORDER — POTASSIUM CHLORIDE ER 10 MEQ PO TBCR
10.0000 meq | EXTENDED_RELEASE_TABLET | Freq: Every day | ORAL | Status: DC
Start: 2016-11-08 — End: 2016-11-08
  Filled 2016-11-07 (×2): qty 1

## 2016-11-07 MED ORDER — FUROSEMIDE 10 MG/ML IJ SOLN
40.0000 mg | Freq: Once | INTRAMUSCULAR | Status: AC
  Administered 2016-11-07: 40 mg via INTRAVENOUS
  Filled 2016-11-07: qty 4

## 2016-11-07 MED ORDER — LEVOTHYROXINE SODIUM 50 MCG PO TABS
50.0000 ug | ORAL_TABLET | Freq: Every day | ORAL | Status: DC
  Administered 2016-11-08 – 2016-11-16 (×9): 50 ug via ORAL
  Filled 2016-11-07 (×9): qty 1

## 2016-11-07 MED ORDER — ONDANSETRON HCL 4 MG PO TABS: 4.0000 mg | ORAL_TABLET | Freq: Four times a day (QID) | ORAL | Status: DC | PRN

## 2016-11-07 MED ORDER — SODIUM CHLORIDE 0.9% FLUSH
3.0000 mL | Freq: Two times a day (BID) | INTRAVENOUS | Status: DC
  Administered 2016-11-07 – 2016-11-16 (×14): 3 mL via INTRAVENOUS

## 2016-11-07 MED ORDER — METOPROLOL TARTRATE 25 MG PO TABS
25.0000 mg | ORAL_TABLET | Freq: Two times a day (BID) | ORAL | Status: DC
  Administered 2016-11-07 – 2016-11-16 (×18): 25 mg via ORAL
  Filled 2016-11-07 (×19): qty 1

## 2016-11-07 MED ORDER — ONDANSETRON HCL 4 MG/2ML IJ SOLN
4.0000 mg | Freq: Four times a day (QID) | INTRAMUSCULAR | Status: DC | PRN
  Administered 2016-11-14: 4 mg via INTRAVENOUS
  Filled 2016-11-07: qty 2

## 2016-11-07 MED ORDER — APIXABAN 5 MG PO TABS
5.0000 mg | ORAL_TABLET | Freq: Two times a day (BID) | ORAL | Status: DC
  Administered 2016-11-07 – 2016-11-16 (×18): 5 mg via ORAL
  Filled 2016-11-07 (×12): qty 1
  Filled 2016-11-07: qty 2
  Filled 2016-11-07 (×6): qty 1

## 2016-11-07 MED ORDER — ACETAMINOPHEN 650 MG RE SUPP: 650.0000 mg | Freq: Four times a day (QID) | RECTAL | Status: DC | PRN

## 2016-11-07 NOTE — ED Notes (Signed)
CRITICAL VALUE ALERT  Critical value received:  Sodium 119  Date of notification:  11/07/16  Time of notification:  1636  Critical value read back:Yes.    Nurse who received alert:  RMinter, RN  MD notified (1st page):  Dr. Clarene DukeMcManus  Time of first page:  1636  MD notified (2nd page):  Time of second page:  Responding MD:  Dr. Clarene DukeMcManus  Time MD responded:  [phone]

## 2016-11-07 NOTE — ED Notes (Signed)
Patient repositioned to her left side with pillow under her right hip. Patient given water at this time per RN approval.

## 2016-11-07 NOTE — ED Notes (Signed)
Attempted ambulation at this time with another staff present and pt was able to stand with extensive assistance but unable to ambulate.

## 2016-11-07 NOTE — ED Notes (Signed)
Pt in radiology at this time. 

## 2016-11-07 NOTE — ED Provider Notes (Signed)
[facility] DEPT Provider Note   CSN: [id number] Arrival date & time: 11/07/16  1430     History   Chief Complaint Chief Complaint  Patient presents with  . Shortness of Breath    HPI Yvonne Chan is a 83 y.o. female.   Shortness of Breath     Pt was seen at 1450. Per pt, c/o gradual onset and persistence of constant generalized weakness for the past 1 month. Pt states she was discharged from the hospital yesterday for dx new onset afib/RVR and CHF. Pt states she "was too weak to stand or walk" today, as well as "SOB," so she came back to the ED for evaluation. Pt states she "didn't feel any better while I was in the hospital either." Has been associated with cough for the past week. Denies fevers, no falls, no palpitations/CP, no abd pain, no N/V/D, no focal motor weakness, no tingling/numbness in extremities.   Past Medical History:  Diagnosis Date  . Atrial fibrillation (HCC)   . Essential hypertension   . Hypothyroidism   . Lumbago   . Neuralgia   . Neuritis     Patient Active Problem List   Diagnosis Date Noted  . Atrial fibrillation with RVR (HCC) 11/01/2016  . HTN (hypertension) 11/01/2016  . Hypothyroidism 11/01/2016  . Acute diastolic CHF (congestive heart failure) (HCC) 11/01/2016    Past Surgical History:  Procedure Laterality Date  . ABDOMINAL HYSTERECTOMY    . APPENDECTOMY      OB History    Gravida Para Term Preterm AB Living   10         5   SAB TAB Ectopic Multiple Live Births                   Home Medications    Prior to Admission medications   Medication Sig Start Date End Date Taking? Authorizing Provider  apixaban (ELIQUIS) 5 MG TABS tablet Take 1 tablet (5 mg total) by mouth 2 (two) times daily. 11/06/16   Henderson Cloud, MD  furosemide (LASIX) 40 MG tablet Take 1 tablet (40 mg total) by mouth daily. 11/07/16   Henderson Cloud, MD  levothyroxine (SYNTHROID, LEVOTHROID) 50 MCG tablet Take 50 mcg by mouth daily  before breakfast.    Historical Provider, MD  methyldopa (ALDOMET) 250 MG tablet Take 250 mg by mouth daily.    Historical Provider, MD  metoprolol tartrate (LOPRESSOR) 25 MG tablet Take 1 tablet (25 mg total) by mouth 2 (two) times daily. 11/06/16   Henderson Cloud, MD  Omega-3 Fatty Acids (FISH OIL) 1000 MG CAPS Take 1 capsule by mouth daily.    Historical Provider, MD  potassium chloride (K-DUR) 10 MEQ tablet Take 10 mEq by mouth daily.    Historical Provider, MD    Family History History reviewed. No pertinent family history.  Social History Social History  Substance Use Topics  . Smoking status: Never Smoker  . Smokeless tobacco: Never Used  . Alcohol use No     Allergies   Patient has no known allergies.   Review of Systems Review of Systems  Respiratory: Positive for shortness of breath.   ROS: Statement: All systems negative except as marked or noted in the HPI; Constitutional: Negative for fever and chills. +generalized weakness.; ; Eyes: Negative for eye pain, redness and discharge. ; ; ENMT: Negative for ear pain, hoarseness, nasal congestion, sinus pressure and sore throat. ; ; Cardiovascular: Negative for chest pain,  palpitations, diaphoresis, and peripheral edema. ; ; Respiratory: +SOB. Negative for cough, wheezing and stridor. ; ; Gastrointestinal: Negative for nausea, vomiting, diarrhea, abdominal pain, blood in stool, hematemesis, jaundice and rectal bleeding. . ; ; Genitourinary: Negative for dysuria, flank pain and hematuria. ; ; Musculoskeletal: Negative for back pain and neck pain. Negative for swelling and trauma.; ; Skin: Negative for pruritus, rash, abrasions, blisters, bruising and skin lesion.; ; Neuro: Negative for headache, lightheadedness and neck stiffness. Negative for altered level of consciousness, altered mental status, extremity weakness, paresthesias, involuntary movement, seizure and syncope.       Physical Exam Updated Vital Signs BP  125/80 (BP Location: Right Arm)   Pulse 96   Temp 98.8 F (37.1 C) (Oral)   Resp 20   Ht 5\' 6"  (1.676 m)   Wt 237 lb 3 oz (107.6 kg)   SpO2 93%   BMI 38.28 kg/m    14:52 Orthostatic Vital Signs DF  Orthostatic Lying   BP- Lying: 122/82  Pulse- Lying: 88      Orthostatic Sitting  BP- Sitting: 127/73  Pulse- Sitting: 111      Orthostatic Standing at 0 minutes  BP- Standing at 0 minutes: 137/83  Pulse- Standing at 0 minutes: 103     Physical Exam 1455: Physical examination:  Nursing notes reviewed; Vital signs and O2 SAT reviewed;  Constitutional: Well developed, Well nourished, Well hydrated, In no acute distress; Head:  Normocephalic, atraumatic; Eyes: EOMI, PERRL, No scleral icterus; ENMT: Mouth and pharynx normal, Mucous membranes moist; Neck: Supple, Full range of motion, No lymphadenopathy; Cardiovascular: Irregular rate and rhythm, No gallop; Respiratory: Breath sounds clear & equal bilaterally, No wheezes.  Speaking full sentences with ease, Normal respiratory effort/excursion; Chest: Nontender, Movement normal; Abdomen: Soft, Nontender, Nondistended, Normal bowel sounds; Genitourinary: No CVA tenderness; Extremities: Pulses normal, No tenderness, +2 pedal edema bilat. No calf asymmetry.; Neuro: AA&Ox3, Major CN grossly intact.  Speech clear. No gross focal motor or sensory deficits in extremities.; Skin: Color normal, Warm, Dry.   ED Treatments / Results  Labs (all labs ordered are listed, but only abnormal results are displayed)   EKG  EKG Interpretation  Date/Time:  Wednesday November 07 2016 14:43:02 EST Ventricular Rate:  97 PR Interval:    QRS Duration: 108 QT Interval:  346 QTC Calculation: 440 R Axis:   48 Text Interpretation:  Atrial fibrillation Anteroseptal infarct, age indeterminate When compared with ECG of 11/02/2015 No significant change was found Confirmed by [facility]  MD, Nicholos Johns [phone]) on 11/07/2016 3:06:18 PM        Radiology   Procedures Procedures (including critical care time)  Medications Ordered in ED Medications - No data to display   Initial Impression / Assessment and Plan / ED Course  I have reviewed the triage vital signs and the nursing notes.  Pertinent labs & imaging results that were available during my care of the patient were reviewed by me and considered in my medical decision making (see chart for details).  MDM Reviewed: previous chart, nursing note and vitals Reviewed previous: labs and ECG Interpretation: labs, ECG and x-ray Total time providing critical care: 30-74 minutes. This excludes time spent performing separately reportable procedures and services. Consults: admitting MD   CRITICAL CARE Performed by: Laray Anger Total critical care time: 35 minutes Critical care time was exclusive of separately billable procedures and treating other patients. Critical care was necessary to treat or prevent imminent or life-threatening deterioration. Critical care was time spent personally  by me on the following activities: development of treatment plan with patient and/or surrogate as well as nursing, discussions with consultants, evaluation of patient's response to treatment, examination of patient, obtaining history from patient or surrogate, ordering and performing treatments and interventions, ordering and review of laboratory studies, ordering and review of radiographic studies, pulse oximetry and re-evaluation of patient's condition.   Results for orders placed or performed during the hospital encounter of 11/07/16  Basic metabolic panel  Result Value Ref Range   Sodium 119 (LL) 135 - 145 mmol/L   Potassium 3.5 3.5 - 5.1 mmol/L   Chloride 80 (L) 101 - 111 mmol/L   CO2 31 22 - 32 mmol/L   Glucose, Bld 121 (H) 65 - 99 mg/dL   BUN 11 6 - 20 mg/dL   Creatinine, Ser 6.96 0.44 - 1.00 mg/dL   Calcium 7.6 (L) 8.9 - 10.3 mg/dL   GFR calc non Af Amer >60 >60 mL/min    GFR calc Af Amer >60 >60 mL/min   Anion gap 8 5 - 15  Brain natriuretic peptide  Result Value Ref Range   B Natriuretic Peptide 630.0 (H) 0.0 - 100.0 pg/mL  Troponin I  Result Value Ref Range   Troponin I <0.03 <0.03 ng/mL  CBC with Differential  Result Value Ref Range   WBC 14.3 (H) 4.0 - 10.5 K/uL   RBC 4.60 3.87 - 5.11 MIL/uL   Hemoglobin 14.0 12.0 - 15.0 g/dL   HCT 29.5 28.4 - 13.2 %   MCV 87.4 78.0 - 100.0 fL   MCH 30.4 26.0 - 34.0 pg   MCHC 34.8 30.0 - 36.0 g/dL   RDW 44.0 10.2 - 72.5 %   Platelets 360 150 - 400 K/uL   Neutrophils Relative % 91 %   Neutro Abs 13.0 (H) 1.7 - 7.7 K/uL   Lymphocytes Relative 4 %   Lymphs Abs 0.6 (L) 0.7 - 4.0 K/uL   Monocytes Relative 4 %   Monocytes Absolute 0.5 0.1 - 1.0 K/uL   Eosinophils Relative 1 %   Eosinophils Absolute 0.1 0.0 - 0.7 K/uL   Basophils Relative 0 %   Basophils Absolute 0.0 0.0 - 0.1 K/uL  Urinalysis, Routine w reflex microscopic  Result Value Ref Range   Color, Urine YELLOW YELLOW   APPearance CLEAR CLEAR   Specific Gravity, Urine 1.013 1.005 - 1.030   pH 7.0 5.0 - 8.0   Glucose, UA NEGATIVE NEGATIVE mg/dL   Hgb urine dipstick NEGATIVE NEGATIVE   Bilirubin Urine NEGATIVE NEGATIVE   Ketones, ur NEGATIVE NEGATIVE mg/dL   Protein, ur NEGATIVE NEGATIVE mg/dL   Nitrite NEGATIVE NEGATIVE   Leukocytes, UA NEGATIVE NEGATIVE   Dg Chest 2 View Result Date: 11/07/2016 CLINICAL DATA:  Short of breath and chest pain EXAM: CHEST  2 VIEW COMPARISON:  11/01/2016 FINDINGS: Cardiac enlargement with mild vascular congestion. Negative for edema or effusion. Mild left lower lobe atelectasis. Thoracic degenerative changes. IMPRESSION: Cardiac enlargement with mild vascular congestion. Mild left lower lobe atelectasis Electronically Signed   By: Marlan Palau M.D.   On: 11/07/2016 15:31   Results for Yvonne Chan, Yvonne Chan (MRN [id number]) as of 11/07/2016 16:42  Ref. Range 11/01/2016 14:16 11/02/2016 03:06 11/03/2016 05:25 11/04/2016 06:17  11/05/2016 07:17 11/06/2016 05:21 11/07/2016 15:25  Sodium Latest Ref Range: 135 - 145 mmol/L 128 (L) 128 (L) 128 (L) 126 (L) 124 (L) 122 (L) 119 (LL)    1730:   During orthostatic VS, pt needed significant assistance to sit  on stretcher and stand. Family states pt usually able to walk with Reeser from chair to bathroom at baseline.  Pt with hyponatremia lower than previous (was trending downward). BNP elevated, no old to compare. Afib on monitor, rate controlled. Dx and testing d/w pt and family.  Questions answered.  Verb understanding, agreeable to admit. T/C to [facility] Dr. Ophelia CharterYates, case discussed, including:  HPI, pertinent PM/SHx, VS/PE, dx testing, ED course and treatment:  Agreeable to admit, requests to write temporary orders, obtain medical bed to team [facility].     Final Clinical Impressions(s) / ED Diagnoses   Final diagnoses:  None    New Prescriptions New Prescriptions   No medications on file      Samuel JesterKathleen Azariah Bonura, DO 11/10/16 1338

## 2016-11-07 NOTE — ED Triage Notes (Signed)
Patient states she was d/c from the hospital yesterday for new onset afib and chf. PT states generalized weakness and SOB unresolved from the past 4 days.

## 2016-11-07 NOTE — H&P (Signed)
History and Physical    Yvonne Chan FAO:[id number] DOB: Jan 04, 1929 DOA: 11/07/2016  PCP: Abran Richard, PA-C Consultants:  None Patient coming from: home - lives with 2 sons; NOK: daughter, [phone]  Chief Complaint: weakness  HPI: Yvonne Chan is a 83 y.o. female with medical history significant of HTN and hypothyroidism who was admitted from 1/25-30/18 for Afib with RVR and acute diastolic heart failure.  She was discharged yesterday.  Left with cough, now sounds "like it's rumbling now, congested."  So weak she couldn't stand up.  Loose bowels.  EMS concerned that she may need to be on oxygen.  Stood her and walked her with a Stemmer and back while hospitalized to check her O2, otherwise no PT assessment.  Family had a very hard time getting her into the house, had to lay her in the floor.  Has HHN set up.  +SOB.  Non-productive cough.  LE edema before she left, but still ongoing edema.  Slight chest pain.  Dizzy all the time and when she stands up.     ED Course: Per Dr. Clarene Duke: 1730: During orthostatic VS, pt needed significant assistance to sit on stretcher and stand. Family states pt usually able to walk with Wiswell from chair to bathroom at baseline. Pt with hyponatremia lower than previous (was trending downward). BNP elevated, no old to compare. Afib on monitor, rate controlled. Dx and testing d/w pt and family. Questions answered. Verb understanding, agreeable to admit. T/C to [facility] Dr. Ophelia Charter, case discussed, including: HPI, pertinent PM/SHx, VS/PE, dx testing, ED course and treatment: Agreeable to admit, requests to write temporary orders, obtain medical bed to team [facility].   Review of Systems: As per HPI; otherwise 10 point review of systems reviewed and negative.   Ambulatory Status:  Currently basically non-ambulatory  Past Medical History:  Diagnosis Date  . Atrial fibrillation (HCC)   . Essential hypertension   . Hypothyroidism   . Lumbago   . Neuralgia   . Neuritis      Past Surgical History:  Procedure Laterality Date  . ABDOMINAL HYSTERECTOMY    . APPENDECTOMY      Social History   Social History  . Marital status: Widowed    Spouse name: N/A  . Number of children: N/A  . Years of education: N/A   Occupational History  . retired    Social History Main Topics  . Smoking status: Never Smoker  . Smokeless tobacco: Never Used  . Alcohol use No  . Drug use: No  . Sexual activity: Not on file   Other Topics Concern  . Not on file   Social History Narrative  . No narrative on file    No Known Allergies  History reviewed. No pertinent family history.  Prior to Admission medications   Medication Sig Start Date End Date Taking? Authorizing Provider  apixaban (ELIQUIS) 5 MG TABS tablet Take 1 tablet (5 mg total) by mouth 2 (two) times daily. 11/06/16  Yes Estela Isaiah Blakes, MD  furosemide (LASIX) 40 MG tablet Take 1 tablet (40 mg total) by mouth daily. 11/07/16  Yes Estela Isaiah Blakes, MD  levothyroxine (SYNTHROID, LEVOTHROID) 50 MCG tablet Take 50 mcg by mouth daily before breakfast.   Yes Historical Provider, MD  methyldopa (ALDOMET) 250 MG tablet Take 250 mg by mouth daily.   Yes Historical Provider, MD  metoprolol tartrate (LOPRESSOR) 25 MG tablet Take 1 tablet (25 mg total) by mouth 2 (two) times daily. 11/06/16  Yes Henderson Cloud, MD  Omega-3 Fatty Acids (FISH OIL) 1000 MG CAPS Take 1 capsule by mouth daily.   Yes Historical Provider, MD  potassium chloride (K-DUR) 10 MEQ tablet Take 10 mEq by mouth daily.   Yes Historical Provider, MD    Physical Exam: Vitals:   11/07/16 1800 11/07/16 1900 11/07/16 1930 11/07/16 2016  BP: 124/72 127/93 120/72 131/86  Pulse: 110   (!) 123  Resp: 24 23 22 20   Temp:    101.9 F (38.8 C)  TempSrc:    Oral  SpO2: 94%   100%  Weight:    108 kg (238 lb 1.6 oz)  Height:         General: Appears calm and comfortable and is NAD, chronically ill-appearing Eyes:  PERRL,  EOMI, normal lids, iris ENT:  grossly normal hearing, lips & tongue, mmm Neck:  no LAD, masses or thyromegaly Cardiovascular:  Irregularly irregular, no m/r/g. 2+ LE edema.  Respiratory:  Crackles midway up lung fields bilaterally but R>L.  Slightly increased respiratory effort, not on O2. Abdomen:  soft, mildly diffusely tender, nd, NABS Skin:  no rash or induration seen on limited exam Musculoskeletal:  grossly normal tone BUE/BLE, good ROM, no bony abnormality Psychiatric:  grossly normal mood and affect, speech fluent and appropriate, AOx3 Neurologic:  CN 2-12 grossly intact, moves all extremities in coordinated fashion, sensation intact  Labs on Admission: I have personally reviewed following labs and imaging studies  CBC:  Recent Labs Lab 11/01/16 1416 11/06/16 0521 11/07/16 1525  WBC 15.3* 12.1* 14.3*  NEUTROABS  --   --  13.0*  HGB 14.8 14.3 14.0  HCT 43.0 42.1 40.2  MCV 89.2 87.7 87.4  PLT 373 413* 360   Basic Metabolic Panel:  Recent Labs Lab 11/03/16 0525 11/04/16 0617 11/05/16 0717 11/06/16 0521 11/07/16 1525  NA 128* 126* 124* 122* 119*  K 3.2* 3.3* 3.2* 3.6 3.5  CL 86* 84* 82* 81* 80*  CO2 31 33* 34* 32 31  GLUCOSE 107* 109* 107* 109* 121*  BUN 13 14 13 13 11   CREATININE 0.70 0.67 0.61 0.60 0.64  CALCIUM 7.6* 7.8* 7.7* 7.8* 7.6*   GFR: Estimated Creatinine Clearance: 64 mL/min (by C-G formula based on SCr of 0.64 mg/dL). Liver Function Tests: No results for input(s): AST, ALT, ALKPHOS, BILITOT, PROT, ALBUMIN in the last 168 hours. No results for input(s): LIPASE, AMYLASE in the last 168 hours. No results for input(s): AMMONIA in the last 168 hours. Coagulation Profile: No results for input(s): INR, PROTIME in the last 168 hours. Cardiac Enzymes:  Recent Labs Lab 11/01/16 1416 11/07/16 1525  TROPONINI <0.03 <0.03   BNP (last 3 results) No results for input(s): PROBNP in the last 8760 hours. HbA1C: No results for input(s): HGBA1C in the last  72 hours. CBG:  Recent Labs Lab 11/03/16 0626  GLUCAP 118*   Lipid Profile: No results for input(s): CHOL, HDL, LDLCALC, TRIG, CHOLHDL, LDLDIRECT in the last 72 hours. Thyroid Function Tests: No results for input(s): TSH, T4TOTAL, FREET4, T3FREE, THYROIDAB in the last 72 hours. Anemia Panel: No results for input(s): VITAMINB12, FOLATE, FERRITIN, TIBC, IRON, RETICCTPCT in the last 72 hours. Urine analysis:    Component Value Date/Time   COLORURINE YELLOW 11/07/2016 1530   APPEARANCEUR CLEAR 11/07/2016 1530   LABSPEC 1.013 11/07/2016 1530   PHURINE 7.0 11/07/2016 1530   GLUCOSEU NEGATIVE 11/07/2016 1530   HGBUR NEGATIVE 11/07/2016 1530   BILIRUBINUR NEGATIVE 11/07/2016 1530  KETONESUR NEGATIVE 11/07/2016 1530   PROTEINUR NEGATIVE 11/07/2016 1530   NITRITE NEGATIVE 11/07/2016 1530   LEUKOCYTESUR NEGATIVE 11/07/2016 1530    Creatinine Clearance: Estimated Creatinine Clearance: 64 mL/min (by C-G formula based on SCr of 0.64 mg/dL).  Sepsis Labs: @LABRCNTIP (procalcitonin:4,lacticidven:4) ) Recent Results (from the past 240 hour(s))  MRSA PCR Screening     Status: None   Collection Time: 11/01/16  6:37 PM  Result Value Ref Range Status   MRSA by PCR NEGATIVE NEGATIVE Final    Comment:        The GeneXpert MRSA Assay (FDA approved for NASAL specimens only), is one component of a comprehensive MRSA colonization surveillance program. It is not intended to diagnose MRSA infection nor to guide or monitor treatment for MRSA infections.      Radiological Exams on Admission: Dg Chest 2 View  Result Date: 11/07/2016 CLINICAL DATA:  Short of breath and chest pain EXAM: CHEST  2 VIEW COMPARISON:  11/01/2016 FINDINGS: Cardiac enlargement with mild vascular congestion. Negative for edema or effusion. Mild left lower lobe atelectasis. Thoracic degenerative changes. IMPRESSION: Cardiac enlargement with mild vascular congestion. Mild left lower lobe atelectasis Electronically  Signed   By: Marlan Palau M.D.   On: 11/07/2016 15:31    EKG: Independently reviewed.  Afib with rate 97; nonspecific ST changes with no evidence of acute ischemia, NSCSLT  Assessment/Plan Principal Problem:   Hyponatremia Active Problems:   Atrial fibrillation with RVR (HCC)   HTN (hypertension)   Hypothyroidism   Acute diastolic CHF (congestive heart failure) (HCC)   Hyperglycemia   Weakness   Hyponatremia -Na 119, gradually and steadily decreased from 128 on 1/27 -Technically severe since the sodium is <125, but it is chronic (we have no values prior to 1/25, when it was 128) -Etiology appears to be hypervolemic hyponatremia; has mild vascular congestion on CXR, crackles on PE, and elevated BNP to 630 (no prior). -However, it did evolve while she was hospitalized and following hospitalization and she was diuresed to 6L negative during the admission -LFTs do not appear to have been checked, but it seems likely that she is hypoalbuminemic, in which case she may have had third spacing which led to this problem -She did not receive Lasix in the ER but has been taking it at home -Will check urinary and serum Osm as well as urinary sodium and potassium levels -Will give IV Lasix 40 mg x 1 following above labs (and LFTs) -Will treat with fluid restriction of /day -This should be adjusted based on UOP with goal fluid intake of 500 cc less per day than her total urinary output -Will monitor sodium to attempt to avoid overcorrection (>23mEq/L/day) so as to avoid central pontine myelinolysis - but anticipate that correction will be gradual  Afib -RVR has resolved -Rate controlled with Lopressor -Continue Eliquis  Weakness -Likely deconditioning in conjunction with hyponatremia -Aldomet may also be contributing; this is a really unusual medication for her to be on HTN control and is known to contribute to CHF and weakness.  Will hold for now. -Normal TSH on 1/25 -PT/OT  consults -Patient may need placement, will request SW consult  CHF -Echo on 1/26 was technically difficult -There was no mention of diastolic function and we have no prior echo so this was a presumed diagnosis - but seems appropriate -Will treat as above -BNP 630 (no prior) -Troponin negative  Hyperglycemia -Glucose 121 -May be stress response -Will follow with fasting AM labs -It is  unlikely that he will need acute or chronic treatment for this issue   DVT prophylaxis: Eliquis Code Status: Full - confirmed with patient/family Family Communication: Daughter present throughout evaluation  Disposition Plan: To be determined Consults called: PT/OT/SW Admission status: Admit - It is my clinical opinion that admission to INPATIENT is reasonable and necessary because this patient will require at least 2 midnights in the hospital to treat this condition based on the medical complexity of the problems presented.  Given the aforementioned information, the predictability of an adverse outcome is felt to be significant.    Jonah BlueJennifer Brunette Lavalle MD [facility]  If 7PM-7AM, please contact night-coverage [email or web address] Password [email or web address]  11/07/2016, 10:32 PM

## 2016-11-08 LAB — CBC
HCT: 39.2 % (ref 36.0–46.0)
HEMOGLOBIN: 13.6 g/dL (ref 12.0–15.0)
MCH: 30.4 pg (ref 26.0–34.0)
MCHC: 34.7 g/dL (ref 30.0–36.0)
MCV: 87.5 fL (ref 78.0–100.0)
Platelets: 383 10*3/uL (ref 150–400)
RBC: 4.48 MIL/uL (ref 3.87–5.11)
RDW: 12.5 % (ref 11.5–15.5)
WBC: 11.3 10*3/uL — AB (ref 4.0–10.5)

## 2016-11-08 LAB — BASIC METABOLIC PANEL
ANION GAP: 10 (ref 5–15)
BUN: 10 mg/dL (ref 6–20)
CALCIUM: 7.8 mg/dL — AB (ref 8.9–10.3)
CO2: 32 mmol/L (ref 22–32)
Chloride: 79 mmol/L — ABNORMAL LOW (ref 101–111)
Creatinine, Ser: 0.59 mg/dL (ref 0.44–1.00)
GFR calc non Af Amer: >60 mL/min (ref >60)
Glucose, Bld: 121 mg/dL — ABNORMAL HIGH (ref 65–99)
Potassium: 3.2 mmol/L — ABNORMAL LOW (ref 3.5–5.1)
SODIUM: 121 mmol/L — AB (ref 135–145)

## 2016-11-08 LAB — MAGNESIUM: MAGNESIUM: 1.3 mg/dL — AB (ref 1.7–2.4)

## 2016-11-08 LAB — OSMOLALITY: Osmolality: 253 mOsm/kg — ABNORMAL LOW (ref 275–295)

## 2016-11-08 LAB — NA AND K (SODIUM & POTASSIUM), RAND UR
POTASSIUM UR: 40 mmol/L
SODIUM UR: 79 mmol/L

## 2016-11-08 LAB — OSMOLALITY, URINE: OSMOLALITY UR: 330 mosm/kg (ref 300–900)

## 2016-11-08 MED ORDER — POTASSIUM CHLORIDE CRYS ER 20 MEQ PO TBCR
40.0000 meq | EXTENDED_RELEASE_TABLET | Freq: Every day | ORAL | Status: DC
  Administered 2016-11-08 – 2016-11-12 (×5): 40 meq via ORAL
  Filled 2016-11-08 (×5): qty 2

## 2016-11-08 MED ORDER — POTASSIUM CHLORIDE CRYS ER 20 MEQ PO TBCR: 40.0000 meq | EXTENDED_RELEASE_TABLET | Freq: Every day | ORAL | Status: DC

## 2016-11-08 MED ORDER — FUROSEMIDE 10 MG/ML IJ SOLN
40.0000 mg | Freq: Every day | INTRAMUSCULAR | Status: DC
  Administered 2016-11-08: 40 mg via INTRAVENOUS
  Filled 2016-11-08: qty 4

## 2016-11-08 NOTE — Evaluation (Signed)
Physical Therapy Evaluation Patient Details Name: Yvonne Chan MRN: [id number] DOB: [birth year] Today's Date: 11/08/2016   History of Present Illness  83 y.o. female with medical history significant of HTN and hypothyroidism who was admitted from 1/25-30/18 for Afib with RVR and acute diastolic heart failure.  She was discharged yesterday.  Left with cough, now sounds "like it's rumbling now, congested."  So weak she couldn't stand up.  Loose bowels.  EMS concerned that she may need to be on oxygen.  Stood her and walked her with a Radke and back while hospitalized to check her O2, otherwise no PT assessment.  Family had a very hard time getting her into the house, had to lay her in the floor.  Has HHN set up.  +SOB.  Non-productive cough.  LE edema before she left, but still ongoing edema.  Slight chest pain.  Dizzy all the time and when she stands up.   Clinical Impression  Pt received in bed, dtr in law present, and pt is agreeable to PT evaluation.  Pt expressed that prior to her first admission on Jan 25 she was modified independent with gait using a quad cane, but was limited with distance, and only able to go from room to room in the house before needing to sit and rest.  She reports that she was independent with dressing and bathing.  During PT evaluation, she required Mod A for supine<>sit.  Once sitting on the EOB she c/o dizziness, and BP was found to be 132/83, with HR: 123bpm.  She required Mod A+2 for sit<>stand and pivot bed<>chair with RW.  Pt required Mod A due to poor safety awareness, and not getting lined up with the chair prior to attempting to descent.  PT/OT corrected pt, and she was able to line herself up with the chair, but still did not follow commands to reach back for the chair and control her descent.  At this point, pt is recommended for SNF due to increased need for assistance with all functional mobility and and ADL's.      Follow Up Recommendations SNF    Equipment  Recommendations  None recommended by PT    Recommendations for Other Services       Precautions / Restrictions Precautions Precautions: Fall Precaution Comments: Due to immobility.  Restrictions Weight Bearing Restrictions: No      Mobility  Bed Mobility Overal bed mobility: Needs Assistance Bed Mobility: Supine to Sit     Supine to sit: Mod assist;HOB elevated     General bed mobility comments: increased time and encouragement to move.  Increased time sitting on the EOB due to c/o dizziness with BP found to be 132/83, and HR: 123bpm.  Pt constantly letting her head drop forward, and requires constant cues to keep head up and look forward.    Transfers Overall transfer level: Needs assistance Equipment used: Rolling Hemminger (2 wheeled) Transfers: Sit to/from [employer]GI CorporationStand;Stand Pivot Transfers Sit to Stand: Min assist;+2 physical assistance Stand pivot transfers: Mod assist;+2 safety/equipment       General transfer comment: Pt demonstrates poor safety awareness when going to sit down in the chair- does not get lined up with the chair, or reach back, and demonstrates poor eccentric control down into the chair.    Ambulation/Gait                Stairs            Wheelchair Mobility    Modified Rankin (Stroke Patients Only)  Balance Overall balance assessment: Needs assistance Sitting-balance support: Bilateral upper extremity supported;Feet supported Sitting balance-Leahy Scale: Fair Sitting balance - Comments: Pt requires Min guard due to poor safety awareness while sitting on the EOB - allowing head to drop forward and constant fidgiting with leaning forward and then back.     Standing balance support: Bilateral upper extremity supported Standing balance-Leahy Scale: Fair                               Pertinent Vitals/Pain Pain Assessment: 0-10 Pain Score: 5  Pain Location: R UE Pain Descriptors / Indicators: Aching;Burning Pain  Intervention(s): Limited activity within patient's tolerance;Monitored during session;Repositioned    Home Living   Living Arrangements: Children (2 sons) Available Help at Discharge: Available 24 hours/day Type of Home: House Home Access: Stairs to enter   [employer] of Steps: 2 Home Layout: One level Home Equipment: Cane - quad;Coleman - standard;Shower seat      Prior Function     Gait / Transfers Assistance Needed: Pt states she was ambulating with her QC in the house.  Pt states she is only able to ambualte room to room in the house before she needs to sit and rest.    ADL's / Homemaking Assistance Needed: Pt was independent with dressing, pt also reports independence with bathing - able to get into the shower and use the shower chair.          Hand Dominance   Dominant Hand: Right    Extremity/Trunk Assessment   Upper Extremity Assessment Upper Extremity Assessment: Generalized weakness;RUE deficits/detail RUE Deficits / Details: chronic pain in RUE limiting ROM and strength, functional use    Lower Extremity Assessment Lower Extremity Assessment: Generalized weakness       Communication      Cognition Arousal/Alertness: Lethargic Behavior During Therapy: WFL for tasks assessed/performed Overall Cognitive Status: Impaired/Different from baseline Area of Impairment: Orientation Orientation Level: Situation;Disoriented to                  General Comments      Exercises     Assessment/Plan    PT Assessment Patient needs continued PT services  PT Problem List Decreased strength;Decreased activity tolerance;Decreased balance;Decreased mobility;Decreased knowledge of use of DME;Decreased safety awareness;Decreased knowledge of precautions;Cardiopulmonary status limiting activity;Obesity          PT Treatment Interventions DME instruction;Gait training;Functional mobility training;Therapeutic activities;Therapeutic exercise;Balance  training;Neuromuscular re-education;Patient/family education;Cognitive remediation    PT Goals (Current goals can be found in the Care Plan section)  Acute Rehab PT Goals Patient Stated Goal: None stated PT Goal Formulation: With patient/family Time For Goal Achievement: 11/15/16 Potential to Achieve Goals: Fair    Frequency Min 3X/week   Barriers to discharge        Co-evaluation PT/OT/SLP Co-Evaluation/Treatment: Yes Reason for Co-Treatment: Necessary to address cognition/behavior during functional activity;For patient/therapist safety;Complexity of the patient's impairments (multi-system involvement);To address functional/ADL transfers PT goals addressed during session: Mobility/safety with mobility;Balance;Proper use of DME         End of Session Equipment Utilized During Treatment: Gait belt Activity Tolerance: Patient limited by fatigue;Patient limited by lethargy Patient left: in chair;with call bell/phone within reach;with family/visitor present Nurse Communication: Mobility status (Mobility sheet left hanging in the room. )    Functional Assessment Tool Used: [employer] AM-PAC "6-clicks"  Functional Limitation: Mobility: Walking and moving around Mobility: Walking and Moving Around Current Status [phone]): At  least 40 percent but less than 60 percent impaired, limited or restricted Mobility: Walking and Moving Around Goal Status [phone]): At least 20 percent but less than 40 percent impaired, limited or restricted    Time: 0907-0940 PT Time Calculation (min) (ACUTE ONLY): 33 min   Charges:   PT Evaluation $PT Eval Low Complexity: 1 Procedure     PT G Codes:   PT G-Codes **NOT FOR INPATIENT CLASS** Functional Assessment Tool Used: [employer] "6-clicks"  Functional Limitation: Mobility: Walking and moving around Mobility: Walking and Moving Around Current Status [phone]): At least 40 percent but less than 60 percent impaired, limited or  restricted Mobility: Walking and Moving Around Goal Status [phone]): At least 20 percent but less than 40 percent impaired, limited or restricted    Beth Jaye Saal, PT, DPT X: [phone]

## 2016-11-08 NOTE — Care Management Note (Signed)
Case Management Note  Patient Details  Name: Romero BellingMary Glander MRN: [id number] Date of Birth: 12/24/1928  Subjective/Objective:                  Pt admitted with CHF exacerbation. [facility] PT has recommended SNF. CSW is working with pt/family on placement. Kandee Keenory, of [facility] is aware of admission and DC plan.   Action/Plan: Pt will discharge to SNF when ready. No CM needs.   Expected Discharge Date:                  Expected Discharge Plan:  Skilled Nursing Facility  In-House Referral:  Clinical Social Work  Discharge planning Services  CM Consult  Post Acute Care Choice:  NA Choice offered to:  Patient  Status of Service:  Completed, signed off  Malcolm MetroChildress, Kavita Bartl Demske, RN 11/08/2016, 5:10 PM

## 2016-11-08 NOTE — Discharge Summary (Signed)
PROGRESS NOTE    Yvonne Chan  ZOX:[id number] DOB: 27-Jun-1929 DOA: 11/07/2016 PCP: Abran Richard PA-C  Outpatient Specialists:     Brief Narrative:  83 y/o ? Hypertension Hypothyroidism Recent admission 1/25-1/30/18 for A. fib with RVR acute diastolic failure--on that admission found to be 6 L negative continued on oral Lasix and discharged home Returned to the hospital with overall weakness some shortness of breath and inability to ambulate Restarted on Lasix and placed on fluid restriction as found to be hyponatremic to 119 range    Assessment & Plan:   Principal Problem:   Hyponatremia Active Problems:   Atrial fibrillation with RVR (HCC)   HTN (hypertension)   Hypothyroidism   Acute diastolic CHF (congestive heart failure) (HCC)   Hyperglycemia   Weakness   Hypervolemic hyponatremia Random urine sodium is 79 which is relatively low, osmolality is pending Continue fluid restriction 1200 cc, remove water jug out of room, encouraged salt intake and will continue Lasix at 40 mg IV for now Labs in a.m. only medicine pro from 10/26/2018  Atrial fibrillation with RVR, [place] score 5 on Elliquis Heart rate 120 Will give metoprolol 25 twice a day early. Keep on telemetry today  Acute diastolic heart failure Worsening #1 Strict I's and O's Only about 400 cc unclear if accurate  Weakness Probably secondary to hypo-action hyponatremia PTOT to see patient. Patient is a difficult time sitting up  Hypokalemia Probably secondary to diuresis. Recheck in a.m. and keep [place] in about 3.5     DVT prophylaxis: anticoagulated on eliquis Code Status: Full Family Communication: no family + Disposition Plan: inpatietn pending resolution   Consultants:   None yet  Procedures:   nad  Antimicrobials:    na    Subjective: Feels weak, some ha  no cp No n Didn;t eat yesterday  Objective: Vitals:   11/07/16 1930 11/07/16 2016 11/07/16 2339 11/08/16 0702  BP:  120/72 131/86  (!) 140/91  Pulse:  (!) 123  (!) 126  Resp: 22 20  20   Temp:  101.9 F (38.8 C) 99.5 F (37.5 C) 99.2 F (37.3 C)  TempSrc:  Oral Oral Oral  SpO2:  100% 96% 93%  Weight:  108 kg (238 lb 1.6 oz)    Height:        Intake/Output Summary (Last 24 hours) at 11/08/16 0803 Last data filed at 11/08/16 0703  Gross per 24 hour  Intake                0 ml  Output              413 ml  Net             -413 ml   Filed Weights   11/07/16 1443 11/07/16 2016  Weight: 107.6 kg (237 lb 3 oz) 108 kg (238 lb 1.6 oz)    Examination:  General exam: Appears calm and comfortable  Respiratory system: Clear to auscultation. Respiratory effort normal. Cardiovascular system: S1 & S2 heard, RRR. No JVD, grade 2 LE edema Gastrointestinal system: Abdomen is nondistended, soft and nontender. No organomegaly or masses felt. Normal bowel sounds heard. Central nervous system: Alert and oriented. No focal neurological deficits. Extremities: Symmetric 5 x 5 power. Skin: No rashes, lesions or ulcers Psychiatry: Judgement and insight appear normal. Mood & affect appropriate.     Data Reviewed: I have personally reviewed following labs and imaging studies  CBC:  Recent Labs Lab 11/01/16 1416 11/06/16 0521 11/07/16 1525  11/08/16 0505  WBC 15.3* 12.1* 14.3* 11.3*  NEUTROABS  --   --  13.0*  --   HGB 14.8 14.3 14.0 13.6  HCT 43.0 42.1 40.2 39.2  MCV 89.2 87.7 87.4 87.5  PLT 373 413* 360 383   Basic Metabolic Panel:  Recent Labs Lab 11/04/16 0617 11/05/16 0717 11/06/16 0521 11/07/16 1525 11/08/16 0505  NA 126* 124* 122* 119* 121*  K 3.3* 3.2* 3.6 3.5 3.2*  CL 84* 82* 81* 80* 79*  CO2 33* 34* 32 31 32  GLUCOSE 109* 107* 109* 121* 121*  BUN 14 13 13 11 10   CREATININE 0.67 0.61 0.60 0.64 0.59  CALCIUM 7.8* 7.7* 7.8* 7.6* 7.8*   GFR: Estimated Creatinine Clearance: 64 mL/min (by C-G formula based on SCr of 0.59 mg/dL). Liver Function Tests:  Recent Labs Lab 11/07/16 1530    AST 37  ALT 17  ALKPHOS 56  BILITOT 1.0  PROT 6.7  ALBUMIN 2.4*   No results for input(s): LIPASE, AMYLASE in the last 168 hours. No results for input(s): AMMONIA in the last 168 hours. Coagulation Profile: No results for input(s): INR, PROTIME in the last 168 hours. Cardiac Enzymes:  Recent Labs Lab 11/01/16 1416 11/07/16 1525  TROPONINI <0.03 <0.03   BNP (last 3 results) No results for input(s): PROBNP in the last 8760 hours. HbA1C: No results for input(s): HGBA1C in the last 72 hours. CBG:  Recent Labs Lab 11/03/16 0626  GLUCAP 118*   Lipid Profile: No results for input(s): CHOL, HDL, LDLCALC, TRIG, CHOLHDL, LDLDIRECT in the last 72 hours. Thyroid Function Tests: No results for input(s): TSH, T4TOTAL, FREET4, T3FREE, THYROIDAB in the last 72 hours. Anemia Panel: No results for input(s): VITAMINB12, FOLATE, FERRITIN, TIBC, IRON, RETICCTPCT in the last 72 hours. Urine analysis:    Component Value Date/Time   COLORURINE YELLOW 11/07/2016 1530   APPEARANCEUR CLEAR 11/07/2016 1530   LABSPEC 1.013 11/07/2016 1530   PHURINE 7.0 11/07/2016 1530   GLUCOSEU NEGATIVE 11/07/2016 1530   HGBUR NEGATIVE 11/07/2016 1530   BILIRUBINUR NEGATIVE 11/07/2016 1530   KETONESUR NEGATIVE 11/07/2016 1530   PROTEINUR NEGATIVE 11/07/2016 1530   NITRITE NEGATIVE 11/07/2016 1530   LEUKOCYTESUR NEGATIVE 11/07/2016 1530   Sepsis Labs: @LABRCNTIP (procalcitonin:4,lacticidven:4)  ) Recent Results (from the past 240 hour(s))  MRSA PCR Screening     Status: None   Collection Time: 11/01/16  6:37 PM  Result Value Ref Range Status   MRSA by PCR NEGATIVE NEGATIVE Final    Comment:        The GeneXpert MRSA Assay (FDA approved for NASAL specimens only), is one component of a comprehensive MRSA colonization surveillance program. It is not intended to diagnose MRSA infection nor to guide or monitor treatment for MRSA infections.          Radiology Studies: Dg Chest 2  View  Result Date: 11/07/2016 CLINICAL DATA:  Short of breath and chest pain EXAM: CHEST  2 VIEW COMPARISON:  11/01/2016 FINDINGS: Cardiac enlargement with mild vascular congestion. Negative for edema or effusion. Mild left lower lobe atelectasis. Thoracic degenerative changes. IMPRESSION: Cardiac enlargement with mild vascular congestion. Mild left lower lobe atelectasis Electronically Signed   By: Marlan Palau M.D.   On: 11/07/2016 15:31        Scheduled Meds: . apixaban  5 mg Oral BID  . docusate sodium  100 mg Oral BID  . levothyroxine  50 mcg Oral QAC breakfast  . metoprolol tartrate  25 mg Oral BID  .  potassium chloride  40 mEq Oral Daily  . sodium chloride flush  3 mL Intravenous Q12H   Continuous Infusions:   LOS: 1 day    Time spent: 5735    Pleas KochJai Jaiah Weigel, MD [facility] ([facility]) [phone]   If 7PM-7AM, please contact night-coverage [email or web address] Password [email or web address] 11/08/2016, 8:03 AM

## 2016-11-08 NOTE — NC FL2 (Signed)
[place] MEDICAID FL2 LEVEL OF CARE SCREENING TOOL     IDENTIFICATION  Patient Name: Yvonne Chan Birthdate: 1929-08-04 Sex: female Admission Date (Current Location): 11/07/2016  [facility] [place]   Facility and Address:  [facility],  [address], [place] [id number]      Provider Number: [id number]  Attending Physician Name and Address:  Rhetta MuraJai-Gurmukh Samtani, MD  Relative Name and Phone Number:       Current Level of Care: Hospital Recommended Level of Care: Skilled Nursing Facility Prior Approval Number:    Date Approved/Denied:   PASRR Number: [id number]  Discharge Plan: SNF    Current Diagnoses: Patient Active Problem List   Diagnosis Date Noted  . Hyponatremia 11/07/2016  . Hyperglycemia 11/07/2016  . Weakness 11/07/2016  . Atrial fibrillation with RVR (HCC) 11/01/2016  . HTN (hypertension) 11/01/2016  . Hypothyroidism 11/01/2016  . Acute diastolic CHF (congestive heart failure) (HCC) 11/01/2016    Orientation RESPIRATION BLADDER Height & Weight     Self, Time, Situation, Place  Normal Incontinent Weight: 238 lb 1.6 oz (108 kg) Height:  5\' 6"  (167.6 cm)  BEHAVIORAL SYMPTOMS/MOOD NEUROLOGICAL BOWEL NUTRITION STATUS  Other (Comment) (none)  (n/a) Continent Diet (Renal with 1200 mL fluid restriction. See d/c summary for updates)  AMBULATORY STATUS COMMUNICATION OF NEEDS Skin   Total Care Verbally Bruising, Other (Comment) (Excoriated arms/legs)                       Personal Care Assistance Level of Assistance  Bathing, Feeding, Dressing Bathing Assistance: Maximum assistance Feeding assistance: Limited assistance Dressing Assistance: Maximum assistance     Functional Limitations Info  Sight, Hearing, Speech Sight Info: Adequate Hearing Info: Adequate Speech Info: Adequate    SPECIAL CARE FACTORS FREQUENCY  OT (By licensed OT)     PT Frequency: 5 OT Frequency: 5            Contractures  Contractures Info: Not present    Additional Factors Info  Code Status, Allergies Code Status Info: Full code Allergies Info: No known allergies           Current Medications (11/08/2016):  This is the current hospital active medication list Current Facility-Administered Medications  Medication Dose Route Frequency Provider Last Rate Last Dose  . acetaminophen (TYLENOL) tablet 650 mg  650 mg Oral Q6H PRN Jonah BlueJennifer Yates, MD   650 mg at 11/08/16 [id number]   Or  . acetaminophen (TYLENOL) suppository 650 mg  650 mg Rectal Q6H PRN Jonah BlueJennifer Yates, MD      . apixaban Everlene Balls(ELIQUIS) tablet 5 mg  5 mg Oral BID Jonah BlueJennifer Yates, MD   5 mg at 11/08/16 [id number]  . docusate sodium (COLACE) capsule 100 mg  100 mg Oral BID Jonah BlueJennifer Yates, MD   100 mg at 11/08/16 [id number]  . furosemide (LASIX) injection 40 mg  40 mg Intravenous Daily Rhetta MuraJai-Gurmukh Samtani, MD   40 mg at 11/08/16 1049  . levothyroxine (SYNTHROID, LEVOTHROID) tablet 50 mcg  50 mcg Oral QAC breakfast Jonah BlueJennifer Yates, MD   50 mcg at 11/08/16 0820  . metoprolol tartrate (LOPRESSOR) tablet 25 mg  25 mg Oral BID Jonah BlueJennifer Yates, MD   25 mg at 11/08/16 [id number]  . ondansetron (ZOFRAN) tablet 4 mg  4 mg Oral Q6H PRN Jonah BlueJennifer Yates, MD       Or  . ondansetron [facility](ZOFRAN) injection 4 mg  4 mg Intravenous Q6H PRN Jonah BlueJennifer Yates, MD      .  potassium chloride SA (K-DUR,KLOR-CON) CR tablet 40 mEq  40 mEq Oral Daily Rhetta Mura, MD   40 mEq at 11/08/16 0820  . sodium chloride flush (NS) 0.9 % injection 3 mL  3 mL Intravenous Q12H Jonah Blue, MD   3 mL at 11/08/16 1057     Discharge Medications: Please see discharge summary for a list of discharge medications.  Relevant Imaging Results:  Relevant Lab Results:   Additional Information SSN: [id number]  [name], [place] [phone]

## 2016-11-08 NOTE — Evaluation (Signed)
Occupational Therapy Evaluation Patient Details Name: Romero BellingMary Jennette MRN: [id number] DOB: [birth year] Today's Date: 11/08/2016    History of Present Illness 83 y.o. female with medical history significant of HTN and hypothyroidism who was admitted from 1/25-30/18 for Afib with RVR and acute diastolic heart failure.  She was discharged yesterday.  Left with cough, now sounds "like it's rumbling now, congested."  So weak she couldn't stand up.  Loose bowels.  EMS concerned that she may need to be on oxygen.  Stood her and walked her with a [place] and back while hospitalized to check her O2, otherwise no PT assessment.  Family had a very hard time getting her into the house, had to lay her in the floor.  Has HHN set up.  +SOB.  Non-productive cough.  LE edema before she left, but still ongoing edema.  Slight chest pain.  Dizzy all the time and when she stands up.    Clinical Impression   Pt received in bed, PT beginning to work with pt as well, family member in room. PTA earlier in Jan pt used quad cane for functional mobility with rest breaks, also was independent with dressing, bathing, grooming. During evaluation pt requiring increased assistance for bed mobility and transfer tasks due to weakness and lethargy. Pt also requiring consistent verbal cuing for safety, however not following instructions for safety. Recommend SNF on discharge due to increased assistance requiring for all ADL completion and mobility tasks and poor safety awareness with all mobility.     Follow Up Recommendations  SNF    Equipment Recommendations  None recommended by OT       Precautions / Restrictions Precautions Precautions: Fall Precaution Comments: Due to immobility.  Restrictions Weight Bearing Restrictions: No      Mobility Bed Mobility Overal bed mobility: Needs Assistance Bed Mobility: Supine to Sit     Supine to sit: Mod assist;HOB elevated     General bed mobility comments: increased time and  encouragement to move.  Increased time sitting on the EOB due to c/o dizziness with BP found to be 132/83, and HR: 123bpm.  Pt constantly letting her head drop forward, and requires constant cues to keep head up and look forward.    Transfers Overall transfer level: Needs assistance Equipment used: Rolling [place] (2 wheeled) Transfers: Sit to/from [employer]Stand;Stand Pivot Transfers Sit to Stand: Min assist;+2 physical assistance Stand pivot transfers: Mod assist;+2 safety/equipment       General transfer comment: Pt demonstrates poor safety awareness when going to sit down in the chair- does not get lined up with the chair, or reach back, and demonstrates poor eccentric control down into the chair.           ADL Overall ADL's : Needs assistance/impaired Eating/Feeding: Set up;Bed level                   Lower Body Dressing: Maximal assistance;Bed level                 General ADL Comments: Per chart review, prior to most recent admission pt was completing ADL and mobility tasks independently. Pt now requiring max assistance with ADL completion due to weakness and lethargy. Pt also requiring verbal cuing for sequencing and safety.      Vision Vision Assessment?: No apparent visual deficits          Pertinent Vitals/Pain Pain Assessment: 0-10 Pain Score: 5  Pain Location: R UE Pain Descriptors / Indicators: Aching;Burning Pain Intervention(s): Limited  activity within patient's tolerance;Monitored during session;Repositioned     Hand Dominance Right   Extremity/Trunk Assessment Upper Extremity Assessment Upper Extremity Assessment: Generalized weakness;RUE deficits/detail RUE Deficits / Details: chronic pain in RUE limiting ROM and strength, functional use   Lower Extremity Assessment Lower Extremity Assessment: Generalized weakness       Communication     Cognition Arousal/Alertness: Lethargic Behavior During Therapy: WFL for tasks  assessed/performed Overall Cognitive Status: Impaired/Different from baseline Area of Impairment: Orientation Orientation Level: Situation;Disoriented to                            Home Living   Living Arrangements: Children (2 sons) Available Help at Discharge: Available 24 hours/day Type of Home: House Home Access: Stairs to enter [employer] of Steps: 2   Home Layout: One level     Bathroom Shower/Tub: Chief Strategy Officer: Handicapped height     Home Equipment: Cane - quad;Guzzi - standard;Shower seat          Prior Functioning/Environment    Gait / Transfers Assistance Needed: Pt states she was ambulating with her QC in the house.  Pt states she is only able to ambualte room to room in the house before she needs to sit and rest.   ADL's / Homemaking Assistance Needed: Pt was independent with dressing, pt also reports independence with bathing - able to get into the shower and use the shower chair.              OT Problem List: Decreased strength;Decreased activity tolerance;Decreased range of motion;Decreased cognition;Decreased safety awareness;Decreased knowledge of use of DME or AE;Impaired UE functional use;Pain   OT Treatment/Interventions:      OT Goals(Current goals can be found in the care plan section) Acute Rehab OT Goals Patient Stated Goal: None stated   End of Session Equipment Utilized During Treatment: Gait belt;Rolling [place]  Activity Tolerance: Patient limited by lethargy Patient left: in chair;with call bell/phone within reach;with family/visitor present   Time: 1191-4782 OT Time Calculation (min): 22 min Charges:  OT General Charges $OT Visit: 1 Procedure OT Evaluation $OT Eval Low Complexity: 1 Procedure  Ezra Sites, OTR/L  [phone] 11/08/2016, 9:59 AM

## 2016-11-08 NOTE — Clinical Social Work Note (Signed)
Clinical Social Work Assessment  Patient Details  Name: Yvonne Chan MRN: [id number] Date of Birth: 20-Sep-1929  Date of referral:  11/08/16               Reason for consult:  Discharge Planning                Permission sought to share information with:  Family Supports Permission granted to share information::  Yes, Verbal Permission Granted  Name::     Yvonne Chan  Agency::     Relationship::  son  Contact Information:     Housing/Transportation Living arrangements for the past 2 months:  Single Family Home Source of Information:  Patient, Adult Children Patient Interpreter Needed:  None Criminal Activity/Legal Involvement Pertinent to Current Situation/Hospitalization:  No - Comment as needed Significant Relationships:  Adult Children Lives with:  Adult Children Do you feel safe going back to the place where you live?  Yes Need for family participation in patient care:  Yes (Comment)  Care giving concerns:  Pt is not at baseline.    Social Worker assessment / plan:  CSW met with pt and pt's son, Yvonne Chan at bedside. Pt reports she lives with her two sons and they are there most of the time to assist pt. She was d/c from hospital earlier this week and has been very weak. At baseline, pt ambulates short distances with [place] and she is unable to do this now. PT evaluated pt and recommend SNF. CSW discussed SNF placement process, including insurance authorization. They request [facility] if possible.   Employment status:  Retired Nurse, adult PT Recommendations:  [place] / Referral to community resources:  [place]  Patient/Family's Response to care:  Pt is agreeable to short term SNF when medically stable.   Patient/Family's Understanding of and Emotional Response to Diagnosis, Current Treatment, and Prognosis:  Pt and son appear to be aware of admission diagnosis and treatment plan.   Emotional  Assessment Appearance:  Appears stated age Attitude/Demeanor/Rapport:  Other (Cooperative) Affect (typically observed):  Accepting Orientation:  Oriented to Self, Oriented to Place, Oriented to Situation Alcohol / Substance use:  Not Applicable Psych involvement (Current and /or in the community):  No (Comment)  Discharge Needs  Concerns to be addressed:  Discharge Planning Concerns Readmission within the last 30 days:  Yes Current discharge risk:  Physical Impairment Barriers to Discharge:  Continued Medical Work up   Salome Arnt, Goodell 11/08/2016, 11:07 AM [phone]

## 2016-11-08 NOTE — Clinical Social Work Placement (Signed)
   CLINICAL SOCIAL WORK PLACEMENT  NOTE  Date:  11/08/2016  Patient Details  Name: Yvonne Chan MRN: [id number] Date of Birth: Oct 28, 1928  Clinical Social Work is seeking post-discharge placement for this patient at the Skilled  Nursing Facility level of care (*CSW will initial, date and re-position this form in  chart as items are completed):  Yes   Patient/family provided with [place] Clinical Social Work Department's list of facilities offering this level of care within the geographic area requested by the patient (or if unable, by the patient's family).  Yes   Patient/family informed of their freedom to choose among providers that offer the needed level of care, that participate in Medicare, Medicaid or managed care program needed by the patient, have an available bed and are willing to accept the patient.      Patient/family informed of [facility] [place]'s ownership interest in [facility] and [facility], as well as of the fact that they are under no obligation to receive care at these facilities.  PASRR submitted to EDS on 11/08/16     PASRR number received on 11/08/16     Existing PASRR number confirmed on       FL2 transmitted to all facilities in geographic area requested by pt/family on 11/08/16     FL2 transmitted to all facilities within larger geographic area on       Patient informed that his/her managed care company has contracts with or will negotiate with certain facilities, including the following:            Patient/family informed of bed offers received.  Patient chooses bed at       Physician recommends and patient chooses bed at      Patient to be transferred to   on  .  Patient to be transferred to facility by       Patient family notified on   of transfer.  Name of family member notified:        PHYSICIAN       Additional Comment:    _______________________________________________ Karn CassisStultz, Masaki Rothbauer Shanaberger, LCSW 11/08/2016, 11:05  AM [phone]

## 2016-11-08 NOTE — Clinical Social Work Placement (Signed)
   CLINICAL SOCIAL WORK PLACEMENT  NOTE  Date:  11/08/2016  Patient Details  Name: Romero BellingMary Argabright MRN: [id number] Date of Birth: 1929/05/25  Clinical Social Work is seeking post-discharge placement for this patient at the Skilled  Nursing Facility level of care (*CSW will initial, date and re-position this form in  chart as items are completed):  Yes   Patient/family provided with [place] Clinical Social Work Department's list of facilities offering this level of care within the geographic area requested by the patient (or if unable, by the patient's family).  Yes   Patient/family informed of their freedom to choose among providers that offer the needed level of care, that participate in Medicare, Medicaid or managed care program needed by the patient, have an available bed and are willing to accept the patient.      Patient/family informed of 's ownership interest in [facility] and [facility], as well as of the fact that they are under no obligation to receive care at these facilities.  PASRR submitted to EDS on 11/08/16     PASRR number received on 11/08/16     Existing PASRR number confirmed on       FL2 transmitted to all facilities in geographic area requested by pt/family on 11/08/16     FL2 transmitted to all facilities within larger geographic area on       Patient informed that his/her managed care company has contracts with or will negotiate with certain facilities, including the following:        Yes   Patient/family informed of bed offers received.  Patient [facility] bed at [facility]     Physician recommends and patient chooses bed at      Patient to be transferred to [facility] on  .  Patient to be transferred to facility by       Patient family notified on   of transfer.  Name of family member notified:        PHYSICIAN       Additional Comment:  PNC starting authorization.    _______________________________________________ Karn CassisStultz, Antanasia Kaczynski Shanaberger, LCSW 11/08/2016, 11:49 AM [phone]

## 2016-11-09 LAB — COMPREHENSIVE METABOLIC PANEL
ALT: 27 U/L (ref 14–54)
ANION GAP: 12 (ref 5–15)
AST: 54 U/L — ABNORMAL HIGH (ref 15–41)
Albumin: 2.4 g/dL — ABNORMAL LOW (ref 3.5–5.0)
Alkaline Phosphatase: 73 U/L (ref 38–126)
BUN: 11 mg/dL (ref 6–20)
CHLORIDE: 80 mmol/L — AB (ref 101–111)
CO2: 28 mmol/L (ref 22–32)
CREATININE: 0.56 mg/dL (ref 0.44–1.00)
Calcium: 7.8 mg/dL — ABNORMAL LOW (ref 8.9–10.3)
Glucose, Bld: 159 mg/dL — ABNORMAL HIGH (ref 65–99)
POTASSIUM: 3.4 mmol/L — AB (ref 3.5–5.1)
SODIUM: 120 mmol/L — AB (ref 135–145)
Total Bilirubin: 0.8 mg/dL (ref 0.3–1.2)
Total Protein: 6.2 g/dL — ABNORMAL LOW (ref 6.5–8.1)

## 2016-11-09 LAB — URINE CULTURE: Culture: NO GROWTH

## 2016-11-09 MED ORDER — SODIUM CHLORIDE 1 G PO TABS
1.0000 g | ORAL_TABLET | Freq: Two times a day (BID) | ORAL | Status: DC
  Administered 2016-11-09 – 2016-11-16 (×15): 1 g via ORAL
  Filled 2016-11-09 (×22): qty 1

## 2016-11-09 MED ORDER — GUAIFENESIN-DM 100-10 MG/5ML PO SYRP: 5.0000 mL | ORAL_SOLUTION | ORAL | Status: DC | PRN

## 2016-11-09 MED ORDER — FUROSEMIDE 10 MG/ML IJ SOLN
40.0000 mg | Freq: Two times a day (BID) | INTRAMUSCULAR | Status: DC
  Administered 2016-11-09 – 2016-11-10 (×3): 40 mg via INTRAVENOUS
  Filled 2016-11-09 (×3): qty 4

## 2016-11-09 NOTE — Discharge Summary (Signed)
PROGRESS NOTE    Yvonne Chan  WPV:[id number] DOB: Apr 20, 1929 DOA: 11/07/2016 PCP: Antionette Fairy PA-C  Outpatient Specialists:     Brief Narrative:   83 y/o ? Hypertension Hypothyroidism Recent admission 1/25-1/30/18 for A. fib with RVR acute diastolic failure--on that admission found to be 6 L negative continued on oral Lasix and discharged home Returned to the hospital with overall weakness some shortness of breath and inability to ambulate Restarted on Lasix and placed on fluid restriction as found to be hyponatremic to 119 range    Assessment & Plan:   Principal Problem:   Hyponatremia Active Problems:   Atrial fibrillation with RVR (HCC)   HTN (hypertension)   Hypothyroidism   Acute diastolic CHF (congestive heart failure) (HCC)   Hyperglycemia   Weakness   Hypervolemic hyponatremia Random urine sodium is 79 which is relatively low, osmolality 253 Continue fluid restriction 1200 cc, remove water jug out of room, encouraged salt intake and will continue Lasix at 40 mg IV for now Labs in a.m. only medicine pro from 10/26/2018 If no better will need to consult nephrology Get TSH and Cortisol  Atrial fibrillation with RVR, [place] score 5 on Elliquis Heart rate 120 Will give metoprolol 25 twice a day early. Seems to have resolved  Acute diastolic heart failure Worsening #1 Strict I's and O's -1.8 liters so far  Weakness Probably secondary to hypo-action hyponatremia PTOT to see patient. Patient is a difficult time sitting up  Hypokalemia Probably secondary to diuresis. Recheck in a.m. and keep [place] in about 3.5  Mild met encephalopathy Possibly sundowning resovled in am Avoid narcotics or sleep aids     DVT prophylaxis: anticoagulated on eliquis Code Status: Full Family Communication: no family + Disposition Plan: inpatietn pending resolution   Consultants:   None yet  Procedures:   nad  Antimicrobials:    na    Subjective:  Feels  weak Some confusion oper RN overnight No cp No sob No n   Objective: Vitals:   11/08/16 0935 11/08/16 1326 11/08/16 2229 11/09/16 0700  BP: 114/73 121/76 130/83 136/84  Pulse: (!) 106 (!) 101 (!) 114 (!) 108  Resp:  _0 Temp:  98.8 F (37.1 C) 98.7 F (37.1 C) 98.6 F (37 C)  TempSrc:  Oral Oral Oral  SpO2:  96% 99% 98%  Weight:      Height:        Intake/Output Summary (Last 24 hours) at 11/09/16 1208 Last data filed at 11/09/16 0947  Gross per 24 hour  Intake              240 ml  Output             2000 ml  Net            -1760 ml   Filed Weights   11/07/16 1443 11/07/16 2016  Weight: 107.6 kg (237 lb 3 oz) 108 kg (238 lb 1.6 oz)    Examination:  General exam: Appears calm and comfortable -orientable Respiratory system: Clear to auscultation. Respiratory effort normal. Cardiovascular system: S1 & S2 heard, RRR. No JVD, grade 2 LE edema Gastrointestinal system: Abdomen is nondistended, soft and nontender. No organomegaly  Central nervous system: Alert and oriented. No focal neurological deficits. Extremities: Symmetric 5 x 5 power. Skin: No rashes, lesions or ulcers Psychiatry: Judgement and insight appear normal. Mood & affect appropriate.     Data Reviewed: I have personally reviewed following labs and imaging studies  CBC:  Recent Labs Lab 11/06/16 0521 11/07/16 1525 11/08/16 0505  WBC 12.1* 14.3* 11.3*  NEUTROABS  --  13.0*  --   HGB 14.3 14.0 13.6  HCT 42.1 40.2 39.2  MCV 87.7 87.4 87.5  PLT 413* 360 384   Basic Metabolic Panel:  Recent Labs Lab 11/05/16 0717 11/06/16 0521 11/07/16 1525 11/08/16 0340 11/08/16 0505 11/09/16 0509  NA 124* 122* 119*  --  121* 120*  K 3.2* 3.6 3.5  --  3.2* 3.4*  CL 82* 81* 80*  --  79* 80*  CO2 34* 32 31  --  32 28  GLUCOSE 107* 109* 121*  --  121* 159*  BUN _0 --  10 11  CREATININE 0.61 0.60 0.64  --  0.59 0.56  CALCIUM 7.7* 7.8* 7.6*  --  7.8* 7.8*  MG  --   --   --  1.3*  --   --     GFR: Estimated Creatinine Clearance: 64 mL/min (by C-G formula based on SCr of 0.56 mg/dL). Liver Function Tests:  Recent Labs Lab 11/07/16 1530 11/09/16 0509  AST 37 54*  ALT 17 27  ALKPHOS 56 73  BILITOT 1.0 0.8  PROT 6.7 6.2*  ALBUMIN 2.4* 2.4*   No results for input(s): LIPASE, AMYLASE in the last 168 hours. No results for input(s): AMMONIA in the last 168 hours. Coagulation Profile: No results for input(s): INR, PROTIME in the last 168 hours. Cardiac Enzymes:  Recent Labs Lab 11/07/16 1525  TROPONINI <0.03   BNP (last 3 results) No results for input(s): PROBNP in the last 8760 hours. HbA1C: No results for input(s): HGBA1C in the last 72 hours. CBG:  Recent Labs Lab 11/03/16 0626  GLUCAP 118*   Lipid Profile: No results for input(s): CHOL, HDL, LDLCALC, TRIG, CHOLHDL, LDLDIRECT in the last 72 hours. Thyroid Function Tests: No results for input(s): TSH, T4TOTAL, FREET4, T3FREE, THYROIDAB in the last 72 hours. Anemia Panel: No results for input(s): VITAMINB12, FOLATE, FERRITIN, TIBC, IRON, RETICCTPCT in the last 72 hours. Urine analysis:    Component Value Date/Time   COLORURINE YELLOW 11/07/2016 1530   APPEARANCEUR CLEAR 11/07/2016 1530   LABSPEC 1.013 11/07/2016 1530   [place] 7.0 11/07/2016 [place] 11/07/2016 1530   HGBUR NEGATIVE 11/07/2016 [place] 11/07/2016 1530   KETONESUR NEGATIVE 11/07/2016 1530   PROTEINUR NEGATIVE 11/07/2016 1530   NITRITE NEGATIVE 11/07/2016 1530   LEUKOCYTESUR NEGATIVE 11/07/2016 1530   Sepsis Labs: _1 (procalcitonin:4,lacticidven:4)  ) Recent Results (from the past 240 hour(s))  MRSA PCR Screening     Status: None   Collection Time: 11/01/16  6:37 PM  Result Value Ref Range Status   MRSA by PCR NEGATIVE NEGATIVE Final    Comment:        The GeneXpert MRSA Assay (FDA approved for NASAL specimens only), is one component of a comprehensive MRSA  colonization surveillance program. It is not intended to diagnose MRSA infection nor to guide or monitor treatment for MRSA infections.   Urine culture     Status: None   Collection Time: 11/07/16  3:30 PM  Result Value Ref Range Status   Specimen Description URINE, CATHETERIZED  Final   Special Requests NONE  Final   Culture   Final    NO GROWTH Performed at [facility], [address] [place], [place] [address]    Report Status 11/09/2016 FINAL  Final  Radiology Studies: Dg Chest 2 View  Result Date: 11/07/2016 CLINICAL DATA:  Short of breath and chest pain EXAM: CHEST  2 VIEW COMPARISON:  11/01/2016 FINDINGS: Cardiac enlargement with mild vascular congestion. Negative for edema or effusion. Mild left lower lobe atelectasis. Thoracic degenerative changes. IMPRESSION: Cardiac enlargement with mild vascular congestion. Mild left lower lobe atelectasis Electronically Signed   By: Franchot Gallo M.D.   On: 11/07/2016 15:31        Scheduled Meds: . apixaban  5 mg Oral BID  . docusate sodium  100 mg Oral BID  . furosemide  40 mg Intravenous BID  . levothyroxine  50 mcg Oral QAC breakfast  . metoprolol tartrate  25 mg Oral BID  . potassium chloride  40 mEq Oral Daily  . sodium chloride flush  3 mL Intravenous Q12H  . sodium chloride  1 g Oral BID WC   Continuous Infusions:   LOS: 2 days    Time spent: [place], MD [facility] ([phone]   If 7PM-7AM, please contact night-coverage [email or web address] Password [email or web address] 11/09/2016, 12:08 PM

## 2016-11-09 NOTE — Clinical Social Work Note (Signed)
Discussed with MD and pt will be in hospital through weekend. Updated PNC. [facility], but will have to be restarted on Monday.   Derenda FennelKara Sukhdeep Wieting, LCSW [phone]

## 2016-11-09 NOTE — Progress Notes (Addendum)
Upon bedside rounding with report, pt has pulled IV out with mitts on.  This is multiple times per report that IV has been pulled per pt.  Paged Dr Mahala MenghiniSamtani via [facility] to see if able to switch Lasix to PO and leave IV out at this time.    Dr Mahala MenghiniSamtani returned call, will need IV to continue.  Will reattempt IV.  0755- #22 IV placed in left hand, pt tolerated well.

## 2016-11-09 NOTE — Care Management Important Message (Signed)
Important Message  Patient Details  Name: Yvonne BellingMary Chan MRN: [id number] Date of Birth: 11-Jul-1929   Medicare Important Message Given:  Yes    Malcolm MetroChildress, Berdene Askari Demske, RN 11/09/2016, 12:48 PM

## 2016-11-10 ENCOUNTER — Inpatient Hospital Stay (HOSPITAL_COMMUNITY): Payer: Medicare HMO

## 2016-11-10 LAB — CBC WITH DIFFERENTIAL/PLATELET
BASOS PCT: 0 %
Basophils Absolute: 0 10*3/uL (ref 0.0–0.1)
EOS ABS: 0 10*3/uL (ref 0.0–0.7)
EOS PCT: 0 %
HCT: 38.7 % (ref 36.0–46.0)
HEMOGLOBIN: 13.3 g/dL (ref 12.0–15.0)
LYMPHS ABS: 1.1 10*3/uL (ref 0.7–4.0)
Lymphocytes Relative: 15 %
MCH: 30.3 pg (ref 26.0–34.0)
MCHC: 34.4 g/dL (ref 30.0–36.0)
MCV: 88.2 fL (ref 78.0–100.0)
MONOS PCT: 11 %
Monocytes Absolute: 0.7 10*3/uL (ref 0.1–1.0)
NEUTROS PCT: 74 %
Neutro Abs: 5.3 10*3/uL (ref 1.7–7.7)
PLATELETS: 335 10*3/uL (ref 150–400)
RBC: 4.39 MIL/uL (ref 3.87–5.11)
RDW: 12.4 % (ref 11.5–15.5)
WBC: 7.1 10*3/uL (ref 4.0–10.5)

## 2016-11-10 LAB — COMPREHENSIVE METABOLIC PANEL
ALK PHOS: 68 U/L (ref 38–126)
ALT: 28 U/L (ref 14–54)
AST: 55 U/L — ABNORMAL HIGH (ref 15–41)
Albumin: 2.4 g/dL — ABNORMAL LOW (ref 3.5–5.0)
Anion gap: 10 (ref 5–15)
BUN: 12 mg/dL (ref 6–20)
CALCIUM: 7.8 mg/dL — AB (ref 8.9–10.3)
CHLORIDE: 78 mmol/L — AB (ref 101–111)
CO2: 34 mmol/L — AB (ref 22–32)
CREATININE: 0.52 mg/dL (ref 0.44–1.00)
Glucose, Bld: 116 mg/dL — ABNORMAL HIGH (ref 65–99)
Potassium: 3.3 mmol/L — ABNORMAL LOW (ref 3.5–5.1)
SODIUM: 122 mmol/L — AB (ref 135–145)
Total Bilirubin: 0.7 mg/dL (ref 0.3–1.2)
Total Protein: 6.4 g/dL — ABNORMAL LOW (ref 6.5–8.1)

## 2016-11-10 LAB — TSH: TSH: 0.878 u[IU]/mL (ref 0.350–4.500)

## 2016-11-10 MED ORDER — FUROSEMIDE 10 MG/ML IJ SOLN
60.0000 mg | Freq: Two times a day (BID) | INTRAMUSCULAR | Status: DC
  Administered 2016-11-10 – 2016-11-12 (×5): 60 mg via INTRAVENOUS
  Filled 2016-11-10 (×5): qty 6

## 2016-11-10 MED ORDER — MAGNESIUM OXIDE 400 (241.3 MG) MG PO TABS
400.0000 mg | ORAL_TABLET | Freq: Every day | ORAL | Status: DC
  Administered 2016-11-10 – 2016-11-11 (×2): 400 mg via ORAL
  Filled 2016-11-10 (×2): qty 1

## 2016-11-10 MED ORDER — FUROSEMIDE 10 MG/ML IJ SOLN
20.0000 mg | Freq: Once | INTRAMUSCULAR | Status: AC
  Administered 2016-11-10: 20 mg via INTRAVENOUS
  Filled 2016-11-10: qty 2

## 2016-11-10 MED ORDER — ALBUTEROL SULFATE (2.5 MG/3ML) 0.083% IN NEBU: 2.5000 mg | INHALATION_SOLUTION | Freq: Four times a day (QID) | RESPIRATORY_TRACT | Status: DC | PRN

## 2016-11-10 NOTE — Discharge Summary (Signed)
PROGRESS NOTE    Yvonne Chan  ZJQ:[id number] DOB: 1929/09/09 DOA: 11/07/2016 PCP: Antionette Fairy PA-C  Outpatient Specialists:     Brief Narrative:   83 y/o ? Hypertension Hypothyroidism Recent admission 1/25-1/30/18 for A. fib with RVR acute diastolic failure--on that admission found to be 6 L negative continued on oral Lasix and discharged home Returned to the hospital with overall weakness some shortness of breath and inability to ambulate Restarted on Lasix and placed on fluid restriction as found to be hyponatremic to 119 range    Assessment & Plan:   Principal Problem:   Hyponatremia Active Problems:   Atrial fibrillation with RVR (HCC)   HTN (hypertension)   Hypothyroidism   Acute diastolic CHF (congestive heart failure) (HCC)   Hyperglycemia   Weakness   Hypervolemic hyponatremia vs Reset Osmostat or SIDH Random urine sodium is 79 which is relatively low, osmolality 253 also low Continue fluid restriction 1200 cc, remove water jug out of room, encouraged salt intake and will continue Lasix at 40 mg IV for now Sodium still low Get TSH and Cortisol Rpt osm 2/3 to see if improvement  Atrial fibrillation with RVR, [place] score 5 on Elliquis Continue metoprolol 25 twice a day early. Seems to have resolved  Acute diastolic heart failure Worsening #1 cxr rpt 2/3 shows mild pulm edema Strict I's and O's -3.6 liters so far Increased lasix to 60 bid  Weakness Probably secondary to hyponatremia PTOT to see patient. Patient is a difficult time sitting up Needs SNF  Hypokalemia Probably secondary to diuresis. Recheck in a.m. and keep [place] in about 3.5 Recheck magensium in am  Mild met encephalopathy Possibly sundowning resovled in am Avoid narcotics or sleep aids     DVT prophylaxis: anticoagulated on eliquis Code Status: Full Family Communication: no family + Disposition Plan: inpatietn pending resolution   Consultants:   None yet  Procedures:     nad  Antimicrobials:    na    Subjective:  Overall fair Still a little sob Feels cold No other real issue   Objective: Vitals:   11/09/16 1425 11/09/16 2005 11/10/16 0557 11/10/16 1439  BP: 133/77 117/70 (!) 149/85 (!) 141/108  Pulse: (!) 105 (!) 101 78 (!) 117  Resp: '20 20 20 20  '$ Temp: 98.4 F (36.9 C) 98.6 F (37 C) 98.3 F (36.8 C) 97.7 F (36.5 C)  TempSrc: Oral Oral Oral Oral  SpO2: 94% 98% 95% 99%  Weight:      Height:        Intake/Output Summary (Last 24 hours) at 11/10/16 1636 Last data filed at 11/10/16 1440  Gross per 24 hour  Intake              240 ml  Output             2800 ml  Net            -2560 ml   Filed Weights   11/07/16 1443 11/07/16 2016  Weight: 107.6 kg (237 lb 3 oz) 108 kg (238 lb 1.6 oz)    Examination:  General exam: Appears calm and comfortable -orientable Respiratory system: Clear to auscultation. Respiratory effort slighlty laboured Cardiovascular system: S1 & S2 heard, RRR. No JVD, grade 2 LE edema Gastrointestinal system: Abdomen is nondistended, soft and nontender. No organomegaly  Central nervous system: Alert and oriented. No focal neurological deficits. Extremities: Symmetric 5 x 5 power. Skin: No rashes, lesions or ulcers Psychiatry: Judgement and insight appear normal.  Mood & affect appropriate.     Data Reviewed: I have personally reviewed following labs and imaging studies  CBC:  Recent Labs Lab 11/06/16 0521 11/07/16 1525 11/08/16 0505 11/10/16 0646  WBC 12.1* 14.3* 11.3* 7.1  NEUTROABS  --  13.0*  --  5.3  HGB 14.3 14.0 13.6 13.3  HCT 42.1 40.2 39.2 38.7  MCV 87.7 87.4 87.5 88.2  PLT 413* 360 383 932   Basic Metabolic Panel:  Recent Labs Lab 11/06/16 0521 11/07/16 1525 11/08/16 0340 11/08/16 0505 11/09/16 0509 11/10/16 0646  NA 122* 119*  --  121* 120* 122*  K 3.6 3.5  --  3.2* 3.4* 3.3*  CL 81* 80*  --  79* 80* 78*  CO2 32 31  --  32 28 34*  GLUCOSE 109* 121*  --  121* 159* 116*   BUN 13 11  --  '10 11 12  '$ CREATININE 0.60 0.64  --  0.59 0.56 0.52  CALCIUM 7.8* 7.6*  --  7.8* 7.8* 7.8*  MG  --   --  1.3*  --   --   --    GFR: Estimated Creatinine Clearance: 64 mL/min (by C-G formula based on SCr of 0.52 mg/dL). Liver Function Tests:  Recent Labs Lab 11/07/16 1530 11/09/16 0509 11/10/16 0646  AST 37 54* 55*  ALT '17 27 28  '$ ALKPHOS 56 73 68  BILITOT 1.0 0.8 0.7  PROT 6.7 6.2* 6.4*  ALBUMIN 2.4* 2.4* 2.4*   No results for input(s): LIPASE, AMYLASE in the last 168 hours. No results for input(s): AMMONIA in the last 168 hours. Coagulation Profile: No results for input(s): INR, PROTIME in the last 168 hours. Cardiac Enzymes:  Recent Labs Lab 11/07/16 1525  TROPONINI <0.03   BNP (last 3 results) No results for input(s): PROBNP in the last 8760 hours. HbA1C: No results for input(s): HGBA1C in the last 72 hours. CBG: No results for input(s): GLUCAP in the last 168 hours. Lipid Profile: No results for input(s): CHOL, HDL, LDLCALC, TRIG, CHOLHDL, LDLDIRECT in the last 72 hours. Thyroid Function Tests: No results for input(s): TSH, T4TOTAL, FREET4, T3FREE, THYROIDAB in the last 72 hours. Anemia Panel: No results for input(s): VITAMINB12, FOLATE, FERRITIN, TIBC, IRON, RETICCTPCT in the last 72 hours. Urine analysis:    Component Value Date/Time   COLORURINE YELLOW 11/07/2016 1530   APPEARANCEUR CLEAR 11/07/2016 1530   LABSPEC 1.013 11/07/2016 1530   [place] 7.0 11/07/2016 1530   GLUCOSEU NEGATIVE 11/07/2016 1530   HGBUR NEGATIVE 11/07/2016 [place] 11/07/2016 1530   KETONESUR NEGATIVE 11/07/2016 1530   PROTEINUR NEGATIVE 11/07/2016 1530   NITRITE NEGATIVE 11/07/2016 1530   LEUKOCYTESUR NEGATIVE 11/07/2016 1530   Sepsis Labs: '@LABRCNTIP'$ (procalcitonin:4,lacticidven:4)  ) Recent Results (from the past 240 hour(s))  MRSA PCR Screening     Status: None   Collection Time: 11/01/16  6:37 PM  Result Value Ref Range Status   MRSA  by PCR NEGATIVE NEGATIVE Final    Comment:        The GeneXpert MRSA Assay (FDA approved for NASAL specimens only), is one component of a comprehensive MRSA colonization surveillance program. It is not intended to diagnose MRSA infection nor to guide or monitor treatment for MRSA infections.   Urine culture     Status: None   Collection Time: 11/07/16  3:30 PM  Result Value Ref Range Status   Specimen Description URINE, CATHETERIZED  Final   Special Requests NONE  Final   Culture  Final    NO GROWTH Performed at [facility], [place] [address] [place], [place] [address]    Report Status 11/09/2016 FINAL  Final         Radiology Studies: Dg Chest 2 View  Result Date: 11/10/2016 CLINICAL DATA:  Heart failure.  Shortness of breath. EXAM: CHEST  2 VIEW COMPARISON:  November 07, 2014 FINDINGS: Cardiomegaly with mild pulmonary venous congestion. Probable tiny effusions. No significant interval change. IMPRESSION: Cardiomegaly with mild pulmonary venous congestion and tiny effusions. Electronically Signed   By: Dorise Bullion III M.D   On: 11/10/2016 12:47        Scheduled Meds: . apixaban  5 mg Oral BID  . docusate sodium  100 mg Oral BID  . furosemide  60 mg Intravenous BID  . levothyroxine  50 mcg Oral QAC breakfast  . magnesium oxide  400 mg Oral Daily  . metoprolol tartrate  25 mg Oral BID  . potassium chloride  40 mEq Oral Daily  . sodium chloride flush  3 mL Intravenous Q12H  . sodium chloride  1 g Oral BID WC   Continuous Infusions:   LOS: 3 days    Time spent: [place], MD [facility] [phone]   If 7PM-7AM, please contact night-coverage [email or web address] Password [facility] 11/10/2016, 4:36 PM

## 2016-11-10 NOTE — Progress Notes (Signed)
Pt with new onset wheezing, pt request breathing treatment.  Elray McgregorMary Lynch NP notified and ordered PRN albuterol nebulizer.  Will page RT for treatment.

## 2016-11-11 LAB — BASIC METABOLIC PANEL
Anion gap: 11 (ref 5–15)
BUN: 12 mg/dL (ref 6–20)
CALCIUM: 7.9 mg/dL — AB (ref 8.9–10.3)
CO2: 36 mmol/L — ABNORMAL HIGH (ref 22–32)
CREATININE: 0.57 mg/dL (ref 0.44–1.00)
Chloride: 78 mmol/L — ABNORMAL LOW (ref 101–111)
GFR calc non Af Amer: >60 mL/min (ref >60)
Glucose, Bld: 111 mg/dL — ABNORMAL HIGH (ref 65–99)
Potassium: 3.6 mmol/L (ref 3.5–5.1)
SODIUM: 125 mmol/L — AB (ref 135–145)

## 2016-11-11 LAB — OSMOLALITY, URINE: Osmolality, Ur: 313 mOsm/kg (ref 300–900)

## 2016-11-11 LAB — MAGNESIUM: MAGNESIUM: 1.2 mg/dL — AB (ref 1.7–2.4)

## 2016-11-11 LAB — CORTISOL: CORTISOL PLASMA: 20.1 ug/dL

## 2016-11-11 MED ORDER — MAGNESIUM OXIDE 400 (241.3 MG) MG PO TABS
400.0000 mg | ORAL_TABLET | Freq: Two times a day (BID) | ORAL | Status: DC
  Administered 2016-11-11 – 2016-11-16 (×10): 400 mg via ORAL
  Filled 2016-11-11 (×10): qty 1

## 2016-11-11 NOTE — Discharge Summary (Signed)
PROGRESS NOTE    Yvonne Chan  HAL:[id number] DOB: [birth year] DOA: 11/07/2016 PCP: Antionette Fairy PA-C  Outpatient Specialists:     Brief Narrative:   83 y/o ? Hypertension Hypothyroidism Recent admission 1/25-1/30/18 for A. fib with RVR acute diastolic failure--on that admission found to be 6 L negative continued on oral Lasix and discharged home Returned to the hospital with overall weakness some shortness of breath and inability to ambulate Restarted on Lasix and placed on fluid restriction as found to be hyponatremic to 119 range    Assessment & Plan:   Principal Problem:   Hyponatremia Active Problems:   Atrial fibrillation with RVR (HCC)   HTN (hypertension)   Hypothyroidism   Acute diastolic CHF (congestive heart failure) (HCC)   Hyperglycemia   Weakness   Hypervolemic hyponatremia vs Reset Osmostat or SIDH Random urine sodium is 79 which is relatively low, osmolality 253 also low Continue fluid restriction 1200 cc, remove water jug out of room, encouraged salt intake and will continue Lasix at 40-->60 [ 11/11/15] mg IV for now Sodium 120--->125  TSH wnl and Cortisol pending Rpt osm pending  Atrial fibrillation with RVR, [place] score 5 on Elliquis Continue metoprolol 25 twice a day early.  resolved 2/1  Acute hypoxic respiratory failure 2/2 to Acute diastolic heart failure Worsening #1 cxr rpt 2/3 shows mild pulm edema Strict I's and O's -4.4 liters so far Need desat screen  Weakness Probably secondary to hyponatremia PTOT to see patient. Patient is a difficult time sitting up Needs SNF  Hypokalemia Probably secondary to diuresis. Recheck in a.m. and keep [place] in about 3.5 Recheck magensium in am was 1.2,  11/11/16 Replace mag-ox 400 bid  Mild met encephalopathy Possibly sundowning resovled in am Avoid narcotics or sleep aids    DVT prophylaxis: anticoagulated on eliquis Code Status: Full Family Communication: no family + Disposition Plan:  inpatietn pending resolution   Consultants:   None yet  Procedures:   nad  Antimicrobials:    na    Subjective:  Doing ok No new issues Eating and drinking Sleepy per RN Still has a cough Overall just not very engaged and still appears weak   Objective: Vitals:   11/10/16 0557 11/10/16 1439 11/10/16 2217 11/11/16 0717  BP: (!) 149/85 (!) 141/108 117/77 (!) 140/98  Pulse: 78 (!) 117 (!) 129 (!) 130  Resp: '20 20 20 20  '$ Temp: 98.3 F (36.8 C) 97.7 F (36.5 C) 97.9 F (36.6 C) 97.8 F (36.6 C)  TempSrc: Oral Oral Oral Oral  SpO2: 95% 99% 99%   Weight:      Height:        Intake/Output Summary (Last 24 hours) at 11/11/16 1107 Last data filed at 11/11/16 0719  Gross per 24 hour  Intake              243 ml  Output             1600 ml  Net            -1357 ml   Filed Weights   11/07/16 1443 11/07/16 2016  Weight: 107.6 kg (237 lb 3 oz) 108 kg (238 lb 1.6 oz)    Examination:  General exam: comfortable -orientable Respiratory system: Clear to auscultation. Respiratory effort slighlty laboured Cardiovascular system: S1 & S2 heard, RRR. No JVD, grade 1 LE edema Gastrointestinal system: Abdomen is nondistended, soft and nontender. No organomegaly  Central nervous system: Alert and oriented. No focal neurological deficits.  Extremities: Symmetric 5 x 5 power. Skin: No rashes, lesions or ulcers Psychiatry: Judgement and insight appear normal. Mood & affect appropriate.     Data Reviewed: I have personally reviewed following labs and imaging studies  CBC:  Recent Labs Lab 11/06/16 0521 11/07/16 1525 11/08/16 0505 11/10/16 0646  WBC 12.1* 14.3* 11.3* 7.1  NEUTROABS  --  13.0*  --  5.3  HGB 14.3 14.0 13.6 13.3  HCT 42.1 40.2 39.2 38.7  MCV 87.7 87.4 87.5 88.2  PLT 413* 360 383 356   Basic Metabolic Panel:  Recent Labs Lab 11/07/16 1525 11/08/16 0340 11/08/16 0505 11/09/16 0509 11/10/16 0646 11/11/16 0652  NA 119*  --  121* 120* 122* 125*  K  3.5  --  3.2* 3.4* 3.3* 3.6  CL 80*  --  79* 80* 78* 78*  CO2 31  --  32 28 34* 36*  GLUCOSE 121*  --  121* 159* 116* 111*  BUN 11  --  _0 CREATININE 0.64  --  0.59 0.56 0.52 0.57  CALCIUM 7.6*  --  7.8* 7.8* 7.8* 7.9*  MG  --  1.3*  --   --   --  1.2*   GFR: Estimated Creatinine Clearance: 64 mL/min (by C-G formula based on SCr of 0.57 mg/dL). Liver Function Tests:  Recent Labs Lab 11/07/16 1530 11/09/16 0509 11/10/16 0646  AST 37 54* 55*  ALT _1 ALKPHOS 56 73 68  BILITOT 1.0 0.8 0.7  PROT 6.7 6.2* 6.4*  ALBUMIN 2.4* 2.4* 2.4*   No results for input(s): LIPASE, AMYLASE in the last 168 hours. No results for input(s): AMMONIA in the last 168 hours. Coagulation Profile: No results for input(s): INR, PROTIME in the last 168 hours. Cardiac Enzymes:  Recent Labs Lab 11/07/16 1525  TROPONINI <0.03   BNP (last 3 results) No results for input(s): PROBNP in the last 8760 hours. HbA1C: No results for input(s): HGBA1C in the last 72 hours. CBG: No results for input(s): GLUCAP in the last 168 hours. Lipid Profile: No results for input(s): CHOL, HDL, LDLCALC, TRIG, CHOLHDL, LDLDIRECT in the last 72 hours. Thyroid Function Tests: No results for input(s): TSH, T4TOTAL, FREET4, T3FREE, THYROIDAB in the last 72 hours. Anemia Panel: No results for input(s): VITAMINB12, FOLATE, FERRITIN, TIBC, IRON, RETICCTPCT in the last 72 hours. Urine analysis:    Component Value Date/Time   COLORURINE YELLOW 11/07/2016 1530   APPEARANCEUR CLEAR 11/07/2016 1530   LABSPEC 1.013 11/07/2016 1530   [place] 7.0 11/07/2016 [place] 11/07/2016 1530   HGBUR NEGATIVE 11/07/2016 [place] 11/07/2016 1530   KETONESUR NEGATIVE 11/07/2016 1530   PROTEINUR NEGATIVE 11/07/2016 1530   NITRITE NEGATIVE 11/07/2016 1530   LEUKOCYTESUR NEGATIVE 11/07/2016 1530   Sepsis Labs: _2 (procalcitonin:4,lacticidven:4)  ) Recent Results (from the past 240  hour(s))  MRSA PCR Screening     Status: None   Collection Time: 11/01/16  6:37 PM  Result Value Ref Range Status   MRSA by PCR NEGATIVE NEGATIVE Final    Comment:        The GeneXpert MRSA Assay (FDA approved for NASAL specimens only), is one component of a comprehensive MRSA colonization surveillance program. It is not intended to diagnose MRSA infection nor to guide or monitor treatment for MRSA infections.   Urine culture     Status: None   Collection Time: 11/07/16  3:30 PM  Result Value Ref Range Status  Specimen Description URINE, CATHETERIZED  Final   Special Requests NONE  Final   Culture   Final    NO GROWTH Performed at [facility], [address], [place], [place] [address]    Report Status 11/09/2016 FINAL  Final         Radiology Studies: Dg Chest 2 View  Result Date: 11/10/2016 CLINICAL DATA:  Heart failure.  Shortness of breath. EXAM: CHEST  2 VIEW COMPARISON:  November 07, 2014 FINDINGS: Cardiomegaly with mild pulmonary venous congestion. Probable tiny effusions. No significant interval change. IMPRESSION: Cardiomegaly with mild pulmonary venous congestion and tiny effusions. Electronically Signed   By: Dorise Bullion III M.D   On: 11/10/2016 12:47        Scheduled Meds: . apixaban  5 mg Oral BID  . docusate sodium  100 mg Oral BID  . furosemide  60 mg Intravenous BID  . levothyroxine  50 mcg Oral QAC breakfast  . magnesium oxide  400 mg Oral Daily  . metoprolol tartrate  25 mg Oral BID  . potassium chloride  40 mEq Oral Daily  . sodium chloride flush  3 mL Intravenous Q12H  . sodium chloride  1 g Oral BID WC   Continuous Infusions:   LOS: 4 days    Time spent: [place], MD [facility] ([phone]   If 7PM-7AM, please contact night-coverage [email or web address] Password [email or web address] 11/11/2016, 11:07 AM

## 2016-11-12 LAB — GLUCOSE, CAPILLARY
GLUCOSE-CAPILLARY: 126 mg/dL — AB (ref 65–99)
GLUCOSE-CAPILLARY: 147 mg/dL — AB (ref 65–99)

## 2016-11-12 LAB — COMPREHENSIVE METABOLIC PANEL
ALT: 24 U/L (ref 14–54)
ANION GAP: 10 (ref 5–15)
AST: 42 U/L — ABNORMAL HIGH (ref 15–41)
Albumin: 2.4 g/dL — ABNORMAL LOW (ref 3.5–5.0)
Alkaline Phosphatase: 54 U/L (ref 38–126)
BILIRUBIN TOTAL: 1 mg/dL (ref 0.3–1.2)
BUN: 12 mg/dL (ref 6–20)
CO2: 36 mmol/L — ABNORMAL HIGH (ref 22–32)
Calcium: 7.6 mg/dL — ABNORMAL LOW (ref 8.9–10.3)
Chloride: 77 mmol/L — ABNORMAL LOW (ref 101–111)
Creatinine, Ser: 0.63 mg/dL (ref 0.44–1.00)
Glucose, Bld: 121 mg/dL — ABNORMAL HIGH (ref 65–99)
POTASSIUM: 3.7 mmol/L (ref 3.5–5.1)
Sodium: 123 mmol/L — ABNORMAL LOW (ref 135–145)
TOTAL PROTEIN: 6.2 g/dL — AB (ref 6.5–8.1)

## 2016-11-12 MED ORDER — AMLODIPINE BESYLATE 5 MG PO TABS
5.0000 mg | ORAL_TABLET | Freq: Every day | ORAL | Status: DC
  Administered 2016-11-13 – 2016-11-16 (×4): 5 mg via ORAL
  Filled 2016-11-12 (×4): qty 1

## 2016-11-12 NOTE — Discharge Summary (Signed)
PROGRESS NOTE    Yvonne Chan  TWS:[id number] DOB: 12-23-1928 DOA: 11/07/2016 PCP: Antionette Fairy PA-C  Outpatient Specialists:     Brief Narrative:   83 y/o ? Hypertension Hypothyroidism Recent admission 1/25-1/30/18 for A. fib with RVR acute diastolic failure--on that admission found to be 6 L negative continued on oral Lasix and discharged home Returned to the hospital with overall weakness some shortness of breath and inability to ambulate Restarted on Lasix and placed on fluid restriction as found to be hyponatremic to 119 range    Assessment & Plan:   Principal Problem:   Hyponatremia Active Problems:   Atrial fibrillation with RVR (HCC)   HTN (hypertension)   Hypothyroidism   Acute diastolic CHF (congestive heart failure) (HCC)   Hyperglycemia   Weakness   Hypervolemic hyponatremia vs Reset Osmostat or SIDH Random urine sodium is 79 which is relatively low, osmolality 253 also low Continue fluid restriction 1200 cc, remove water jug out of room, encouraged salt intake and will continue Lasix at 40-->60 [ 11/11/15] mg IV for now Sodium 120--->125-->123 wght 109 -->102 KG  TSH wnl and Cortisol nl at 20  Rpt trending 253--->313 so slowly resolving Removed water jugs from room  Atrial fibrillation with RVR, [place] score 5 on Elliquis Continue metoprolol 25 twice a day early.  resolved 2/1  Acute hypoxic respiratory failure 2/2 to Acute diastolic heart failure Worsening #1 cxr rpt 2/3 shows mild pulm edema Strict I's and O's -4.7 liters so far Passed desat screen--no longer needing oxygen at rest  Htn Poorly controlled D/c off of [place]-added 76m Norvasc 2/5  Weakness Probably secondary to hyponatremia PTOT to see patient. Patient is a difficult time sitting up Needs SNF  Hypokalemia Probably secondary to diuresis. Recheck in a.m. and keep > 3.5 Recheck magensium in am was 1.2,  11/11/16 Replace mag-ox 400 bid Recheck magnesium am 2/5  Mild met  encephalopathy Possibly sundowning resovled in am Avoid narcotics or sleep aids    DVT prophylaxis: anticoagulated on eliquis Code Status: Full Family Communication: no family + today Disposition Plan: inpatietn pending resolution--might need upto 48 hours for stabilization of sodium   Consultants:   None yet  Procedures:   nad  Antimicrobials:    na    Subjective:  Still weak Nursing reports couldn't ambulate patient  2/4 pm and was weak at bedside She is eating Looks like hasn't needed oxygen and is off nasal canula    Objective: Vitals:   11/11/16 0717 11/11/16 1600 11/11/16 2237 11/12/16 0613  BP: (!) 140/98 (!) 149/100 (!) 131/97 (!) 158/100  Pulse: (!) 130 (!) 112 (!) 125 (!) 123  Resp: _0 Temp: 97.8 F (36.6 C) 97.5 F (36.4 C) 98.7 F (37.1 C) 98.4 F (36.9 C)  TempSrc: Oral Oral Oral Oral  SpO2:  96% 94% 94%  Weight:    102.8 kg (226 lb 10.1 oz)  Height:        Intake/Output Summary (Last 24 hours) at 11/12/16 1039 Last data filed at 11/11/16 2243  Gross per 24 hour  Intake              243 ml  Output             1000 ml  Net             -757 ml   Filed Weights   11/07/16 1443 11/07/16 2016 11/12/16 0613  Weight: 107.6 kg (237 lb 3 oz) 108  kg (238 lb 1.6 oz) 102.8 kg (226 lb 10.1 oz)    Examination:  General exam: comfortable -orientable Respiratory system: Clear to auscultation. Respiratory effort improved Cardiovascular system: S1 & S2 heard, RRR. No JVD, grade 1 LE edema Gastrointestinal system: Abdomen is nondistended, soft and nontender. No organomegaly  Central nervous system: Alert and oriented. No focal neurological deficits. Extremities: Symmetric 5 x 5 power. Skin: No rashes, lesions or ulcers Psychiatry: Judgement and insight appear normal. Mood & affect appropriate.     Data Reviewed: I have personally reviewed following labs and imaging studies  CBC:  Recent Labs Lab 11/06/16 0521 11/07/16 1525  11/08/16 0505 11/10/16 0646  WBC 12.1* 14.3* 11.3* 7.1  NEUTROABS  --  13.0*  --  5.3  HGB 14.3 14.0 13.6 13.3  HCT 42.1 40.2 39.2 38.7  MCV 87.7 87.4 87.5 88.2  PLT 413* 360 383 998   Basic Metabolic Panel:  Recent Labs Lab 11/08/16 0340 11/08/16 0505 11/09/16 0509 11/10/16 0646 11/11/16 0652 11/12/16 0546  NA  --  121* 120* 122* 125* 123*  K  --  3.2* 3.4* 3.3* 3.6 3.7  CL  --  79* 80* 78* 78* 77*  CO2  --  32 28 34* 36* 36*  GLUCOSE  --  121* 159* 116* 111* 121*  BUN  --  _0 CREATININE  --  0.59 0.56 0.52 0.57 0.63  CALCIUM  --  7.8* 7.8* 7.8* 7.9* 7.6*  MG 1.3*  --   --   --  1.2*  --    GFR: Estimated Creatinine Clearance: 62.3 mL/min (by C-G formula based on SCr of 0.63 mg/dL). Liver Function Tests:  Recent Labs Lab 11/07/16 1530 11/09/16 0509 11/10/16 0646 11/12/16 0546  AST 37 54* 55* 42*  ALT _1 ALKPHOS 56 73 68 54  BILITOT 1.0 0.8 0.7 1.0  PROT 6.7 6.2* 6.4* 6.2*  ALBUMIN 2.4* 2.4* 2.4* 2.4*   No results for input(s): LIPASE, AMYLASE in the last 168 hours. No results for input(s): AMMONIA in the last 168 hours. Coagulation Profile: No results for input(s): INR, PROTIME in the last 168 hours. Cardiac Enzymes:  Recent Labs Lab 11/07/16 1525  TROPONINI <0.03   BNP (last 3 results) No results for input(s): PROBNP in the last 8760 hours. HbA1C: No results for input(s): HGBA1C in the last 72 hours. CBG:  Recent Labs Lab 11/12/16 0802  GLUCAP 126*   Lipid Profile: No results for input(s): CHOL, HDL, LDLCALC, TRIG, CHOLHDL, LDLDIRECT in the last 72 hours. Thyroid Function Tests: No results for input(s): TSH, T4TOTAL, FREET4, T3FREE, THYROIDAB in the last 72 hours. Anemia Panel: No results for input(s): VITAMINB12, FOLATE, FERRITIN, TIBC, IRON, RETICCTPCT in the last 72 hours. Urine analysis:    Component Value Date/Time   COLORURINE YELLOW 11/07/2016 1530   APPEARANCEUR CLEAR 11/07/2016 1530   LABSPEC 1.013  11/07/2016 1530   [place] 7.0 11/07/2016 1530   GLUCOSEU NEGATIVE 11/07/2016 1530   HGBUR NEGATIVE 11/07/2016 [place] 11/07/2016 1530   KETONESUR NEGATIVE 11/07/2016 1530   PROTEINUR NEGATIVE 11/07/2016 1530   NITRITE NEGATIVE 11/07/2016 1530   LEUKOCYTESUR NEGATIVE 11/07/2016 1530   Sepsis Labs: _2 (procalcitonin:4,lacticidven:4)  ) Recent Results (from the past 240 hour(s))  Urine culture     Status: None   Collection Time: 11/07/16  3:30 PM  Result Value Ref Range Status   Specimen Description URINE, CATHETERIZED  Final   Special Requests  NONE  Final   Culture   Final    NO GROWTH Performed at [facility], [place] [address] [place], [place] [address]    Report Status 11/09/2016 FINAL  Final         Radiology Studies: Dg Chest 2 View  Result Date: 11/10/2016 CLINICAL DATA:  Heart failure.  Shortness of breath. EXAM: CHEST  2 VIEW COMPARISON:  November 07, 2014 FINDINGS: Cardiomegaly with mild pulmonary venous congestion. Probable tiny effusions. No significant interval change. IMPRESSION: Cardiomegaly with mild pulmonary venous congestion and tiny effusions. Electronically Signed   By: Dorise Bullion III M.D   On: 11/10/2016 12:47        Scheduled Meds: . apixaban  5 mg Oral BID  . docusate sodium  100 mg Oral BID  . furosemide  60 mg Intravenous BID  . levothyroxine  50 mcg Oral QAC breakfast  . magnesium oxide  400 mg Oral BID  . metoprolol tartrate  25 mg Oral BID  . potassium chloride  40 mEq Oral Daily  . sodium chloride flush  3 mL Intravenous Q12H  . sodium chloride  1 g Oral BID WC   Continuous Infusions:   LOS: 5 days    Time spent: [place], MD [facility] ([phone]   If 7PM-7AM, please contact night-coverage [email or web address] Password [email or web address] 11/12/2016, 10:39 AM

## 2016-11-12 NOTE — Progress Notes (Signed)
Physical Therapy Treatment Patient Details Name: Yvonne BellingMary Chan MRN: [id number] DOB: 29-Nov-1928 Today's Date: 11/12/2016    History of Present Illness 83 y.o. female with medical history significant of HTN and hypothyroidism who was admitted from 1/25-30/18 for Afib with RVR and acute diastolic heart failure.  She was discharged yesterday.  Left with cough, now sounds "like it's rumbling now, congested."  So weak she couldn't stand up.  Loose bowels.  EMS concerned that she may need to be on oxygen.  Stood her and walked her with a Foucher and back while hospitalized to check her O2, otherwise no PT assessment.  Family had a very hard time getting her into the house, had to lay her in the floor.  Has HHN set up.  +SOB.  Non-productive cough.  LE edema before she left, but still ongoing edema.  Slight chest pain.  Dizzy all the time and when she stands up.     PT Comments    Pt received in bed, asleep, but marginally agreeable to PT tx.  Pt requires great encouragement to participate in mobility efforts today.  She is not conversational and seems somewhat withdrawn.  She was able to transfer into the chair, but she continues to require up to Mod A due to poor safety awareness despite education.  Continue to recommend SNF at this time due to increased need for assistance with all functional mobility tasks.    Follow Up Recommendations  SNF     Equipment Recommendations  None recommended by PT    Recommendations for Other Services       Precautions / Restrictions Precautions Precautions: Fall Precaution Comments: Due to immobility and poor safety awareness during mobiltiy.  Restrictions Weight Bearing Restrictions: No    Mobility  Bed Mobility Overal bed mobility: Needs Assistance Bed Mobility: Supine to Sit     Supine to sit: HOB elevated;Min assist     General bed mobility comments: Pt continues to reach for therapist during transfer and she was encouraged to attempt to mobilize on  her own.  She requires increased time to perform bed mobility.   Transfers Overall transfer level: Needs assistance Equipment used: Rolling Bau (2 wheeled) Transfers: Sit to/from UGI CorporationStand;Stand Pivot Transfers Sit to Stand: Mod assist (educated pt to push from the bed instead of pulling on the RW. ) Stand pivot transfers: Min assist;Mod assist       General transfer comment: Min A while pt was taking a few steps over to the chair, however Mod A during descent down into the chair.  PT attempted to assist pt with reaching back into the chair, and had the pt's hand down on the arm rest, however pt lifted it up into the air and plopped down into the chair.    Ambulation/Gait Ambulation/Gait assistance:  (NA)               Stairs            Wheelchair Mobility    Modified Rankin (Stroke Patients Only)       Balance Overall balance assessment: Needs assistance Sitting-balance support: Bilateral upper extremity supported;Feet supported   Sitting balance - Comments: Static sitting balance is improved, and she only requires supervision.    Standing balance support: Bilateral upper extremity supported Standing balance-Leahy Scale: Fair                      Cognition Arousal/Alertness: Lethargic Behavior During Therapy: Flat affect (irritable)  Exercises      General Comments        Pertinent Vitals/Pain Pain Location: Pt reports that she is having pain, but is not willing to answer where her pain is located.  Pain Intervention(s): Limited activity within patient's tolerance;Monitored during session;Repositioned    Home Living                      Prior Function            PT Goals (current goals can now be found in the care plan section) Acute Rehab PT Goals Patient Stated Goal: None stated PT Goal Formulation: With patient/family Time For Goal Achievement: 11/15/16 Potential to Achieve Goals:  Fair Progress towards PT goals: Progressing toward goals (Slowly)    Frequency    Min 3X/week      PT Plan Current plan remains appropriate    Co-evaluation             End of Session Equipment Utilized During Treatment: Gait belt Activity Tolerance: Patient tolerated treatment well Patient left: in chair;with call bell/phone within reach     Time: 1311-1327 PT Time Calculation (min) (ACUTE ONLY): 16 min  Charges:  $Therapeutic Activity: 8-22 mins                    G Codes:      Beth Verlin Uher, PT, DPT X: [phone]

## 2016-11-12 NOTE — Clinical Social Work Note (Signed)
CSW updated PNC on pt. Will re-start authorization in AM as anticipate 1-2 days until d/c.   Derenda FennelKara Nyellie Yetter, LCSW [phone]

## 2016-11-13 LAB — MAGNESIUM: MAGNESIUM: 1.3 mg/dL — AB (ref 1.7–2.4)

## 2016-11-13 LAB — BASIC METABOLIC PANEL
Anion gap: 10 (ref 5–15)
BUN: 13 mg/dL (ref 6–20)
CHLORIDE: 78 mmol/L — AB (ref 101–111)
CO2: 35 mmol/L — AB (ref 22–32)
Calcium: 7.8 mg/dL — ABNORMAL LOW (ref 8.9–10.3)
Creatinine, Ser: 0.56 mg/dL (ref 0.44–1.00)
GFR calc Af Amer: >60 mL/min (ref >60)
GFR calc non Af Amer: >60 mL/min (ref >60)
GLUCOSE: 131 mg/dL — AB (ref 65–99)
POTASSIUM: 3.5 mmol/L (ref 3.5–5.1)
Sodium: 123 mmol/L — ABNORMAL LOW (ref 135–145)

## 2016-11-13 MED ORDER — FUROSEMIDE 10 MG/ML IJ SOLN
20.0000 mg | Freq: Two times a day (BID) | INTRAMUSCULAR | Status: DC
  Administered 2016-11-13 – 2016-11-16 (×6): 20 mg via INTRAVENOUS
  Filled 2016-11-13 (×6): qty 2

## 2016-11-13 MED ORDER — POTASSIUM CHLORIDE IN NACL 20-0.9 MEQ/L-% IV SOLN
INTRAVENOUS | Status: DC
  Administered 2016-11-13 – 2016-11-15 (×3): via INTRAVENOUS

## 2016-11-13 MED ORDER — MAGNESIUM SULFATE BOLUS VIA INFUSION: 2.0000 g | Freq: Once | INTRAVENOUS | Status: DC

## 2016-11-13 MED ORDER — MAGNESIUM SULFATE 2 GM/50ML IV SOLN
2.0000 g | Freq: Once | INTRAVENOUS | Status: AC
  Administered 2016-11-13: 2 g via INTRAVENOUS
  Filled 2016-11-13: qty 50

## 2016-11-13 NOTE — Progress Notes (Signed)
Physical Therapy Treatment Patient Details Name: Yvonne Chan MRN: [id number] DOB: 20-May-1929 Today's Date: 11/13/2016    History of Present Illness 83 y.o. female with medical history significant of HTN and hypothyroidism who was admitted from 1/25-30/18 for Afib with RVR and acute diastolic heart failure.  She was discharged yesterday.  Left with cough, now sounds "like it's rumbling now, congested."  So weak she couldn't stand up.  Loose bowels.  EMS concerned that she may need to be on oxygen.  Stood her and walked her with a Gadison and back while hospitalized to check her O2, otherwise no PT assessment.  Family had a very hard time getting her into the house, had to lay her in the floor.  Has HHN set up.  +SOB.  Non-productive cough.  LE edema before she left, but still ongoing edema.  Slight chest pain.  Dizzy all the time and when she stands up.     PT Comments    Pt agreeable to therapy, however very little verbalizations from patient.  Pt tends to use UE's to pull when mobilizing and difficult to correct despite cues.  Pt unable to scoot forward to edge of bed, however no difficulty scooting back into chair after transferring.  Pt required elevation of bed in order to stand due to extreme LE weakness.  Pt again, tends to pull rather than push with this transition.  Pt ambulated 5 feet with Laden and unexpectantly "plopped" down in a chair.  Attempted to ambulate further or encourage further activity, however patient reported she was too fatigued at this point.  Pt in recliner with nursing and family member present.    Follow Up Recommendations  SNF     Equipment Recommendations  None recommended by PT    Recommendations for Other Services       Precautions / Restrictions Precautions Precautions: Fall Precaution Comments: Due to immobility and poor safety awareness during mobiltiy.  Restrictions Weight Bearing Restrictions: No    Mobility  Bed Mobility Overal bed mobility:  Needs Assistance Bed Mobility: Supine to Sit     Supine to sit: [facility] elevated;Min assist     General bed mobility comments: Reach for therapist and/or Eriksen during transfer and she was encouraged to attempt to mobilize on her own.    She requires increased time to perform bed mobility.   Transfers Overall transfer level: Needs assistance Equipment used: Rolling Dura (2 wheeled) Transfers: Sit to/from Stand Sit to Stand: Mod assist         General transfer comment: Pt unable to complete transfer without elevated surface.  Cues for hand placements, however insists on pulling up rather than pushing.    Ambulation/Gait Ambulation/Gait assistance: Min assist Ambulation Distance (Feet): 5 Feet Assistive device: Rolling Sittner (2 wheeled) Gait Pattern/deviations: Trunk flexed;Steppage;Decreased stride length   Gait velocity interpretation: <1.8 ft/sec, indicative of risk for recurrent falls General Gait Details: forward bent posturing   Stairs            Wheelchair Mobility    Modified Rankin (Stroke Patients Only)          Cognition Arousal/Alertness: Lethargic Behavior During Therapy: Flat affect Overall Cognitive Status: Impaired/Different from baseline Area of Impairment: Orientation                           Pertinent Vitals/Pain Pain Assessment: No/denies pain    Home Living  Prior Function            PT Goals (current goals can now be found in the care plan section) Progress towards PT goals: Progressing toward goals    Frequency    Min 3X/week      PT Plan Current plan remains appropriate    nd of Session Equipment Utilized During Treatment: Gait belt Activity Tolerance: Patient tolerated treatment well Patient left: in chair;with call bell/phone within reach;with family/visitor present;with nursing/sitter in room     Time: 1135-1200 PT Time Calculation (min) (ACUTE ONLY): 25 min  Charges:   $Gait Training: 8-22 mins $Therapeutic Activity: 8-22 mins                    Lurena Nidamy B Kyona Chauncey, PTA/CLT [phone]   11/13/2016, 12:11 PM

## 2016-11-13 NOTE — Discharge Summary (Addendum)
PROGRESS NOTE    Yvonne Chan  AYO:[id number] DOB: 1929/02/27 DOA: 11/07/2016 PCP: Antionette Fairy PA-C  Outpatient Specialists:     Brief Narrative:   83 y/o ? Hypertension Hypothyroidism Recent admission 1/25-1/30/18 for A. fib with RVR acute diastolic failure--on that admission found to be 6 L negative continued on oral Lasix and discharged home Returned to the hospital with overall weakness some shortness of breath and inability to ambulate Restarted on Lasix and placed on fluid restriction as found to be hyponatremic to 119 range    Assessment & Plan:   Principal Problem:   Hyponatremia Active Problems:   Atrial fibrillation with RVR (HCC)   HTN (hypertension)   Hypothyroidism   Acute diastolic CHF (congestive heart failure) (HCC)   Hyperglycemia   Weakness   Hypervolemic hyponatremia vs Reset Osmostat or SIDH Random urine sodium is 79 which is relatively low, osmolality 253 also low Continue fluid restriction 1200 cc, remove water jug out of room, encouraged salt intake and will continue Lasix at 40-->60 [ 11/11/15] mg IV for now Sodium 120--->125-->123 wght 109 -->102 KG  TSH wnl and Cortisol nl at 20  Rpt trending 253--->313 so slowly resolving Removed water jugs from room Appreciate input from nephrology  Atrial fibrillation with RVR, [place] score 5 on Elliquis Continue metoprolol 25 twice a day early.  resolved 2/1  Acute hypoxic respiratory failure 2/2 to Acute diastolic heart failure Worsening #1 cxr rpt 2/3 shows mild pulm edema Strict I's and O's -6.2 liters so far Passed desat screen--no longer needing oxygen at rest  Htn Poorly controlled D/c off of Aldomet-added 17m Norvasc 2/5  Weakness Probably secondary to hyponatremia PTOT to see patient. Patient is a difficult time sitting up Needs SNF  Dysphagia Past month the daughter relates that patient has not been eating solid foods which may contribute to the hyponatremia When questioned  patient states she does not feel of bolus or sticking of food rather states that she gets tired chewing Place on dysphagia 2 diet  Hypokalemia Probably secondary to diuresis. Recheck in a.m. and keep > 3.5 Recheck magensium in am was 1.2,  11/11/16 Replace mag-ox 400 bid->added IV Madnesium 2/6 Recheck magnesium am 2/6  Mild met encephalopathy Possibly sundowning resovled in am Avoid narcotics or sleep aids    DVT prophylaxis: anticoagulated on eliquis Code Status: Full Family Communication: Continue discussion with family 2/6 Disposition Plan: inpatietn pending resolution--might need upto 48 hours for stabilization of sodium   Consultants:   None yet  Procedures:   nad  Antimicrobials:    na    Subjective:  Overall looks and states that she is feeling better no chest pain no nausea no vomiting Set up in the chair for about an hour   Objective: Vitals:   11/12/16 2150 11/13/16 0550 11/13/16 0607 11/13/16 1315  BP: 126/73 (!) 151/110 (!) 146/101 (!) 142/91  Pulse: 93 (!) 115 76 81  Resp: _0 Temp: 98.6 F (37 C) 98.6 F (37 C)  98.4 F (36.9 C)  TempSrc: Oral Oral  Oral  SpO2: 93% 92%  96%  Weight:  101.9 kg (224 lb 10.4 oz)    Height:        Intake/Output Summary (Last 24 hours) at 11/13/16 1700 Last data filed at 11/13/16 0900  Gross per 24 hour  Intake              363 ml  Output  1200 ml  Net             -837 ml   Filed Weights   11/07/16 2016 11/12/16 0613 11/13/16 0550  Weight: 108 kg (238 lb 1.6 oz) 102.8 kg (226 lb 10.1 oz) 101.9 kg (224 lb 10.4 oz)    Examination:  General exam: comfortable -orientable Respiratory system: Clear to auscultation. Respiratory effort improved Cardiovascular system: S1 & S2 heard, RRR. No JVD, grade 1 LE edema Gastrointestinal system: Abdomen is nondistended, soft and nontender. No organomegaly  Central nervous system: Alert and oriented. No focal neurological deficits. Extremities:  Symmetric 5 x 5 power. Skin: No rashes, lesions or ulcers Psychiatry: Judgement and insight appear normal. Mood & affect appropriate.     Data Reviewed: I have personally reviewed following labs and imaging studies  CBC:  Recent Labs Lab 11/07/16 1525 11/08/16 0505 11/10/16 0646  WBC 14.3* 11.3* 7.1  NEUTROABS 13.0*  --  5.3  HGB 14.0 13.6 13.3  HCT 40.2 39.2 38.7  MCV 87.4 87.5 88.2  PLT 360 383 326   Basic Metabolic Panel:  Recent Labs Lab 11/08/16 0340  11/09/16 0509 11/10/16 0646 11/11/16 0652 11/12/16 0546 11/13/16 0550  NA  --   < > 120* 122* 125* 123* 123*  K  --   < > 3.4* 3.3* 3.6 3.7 3.5  CL  --   < > 80* 78* 78* 77* 78*  CO2  --   < > 28 34* 36* 36* 35*  GLUCOSE  --   < > 159* 116* 111* 121* 131*  BUN  --   < > _0 CREATININE  --   < > 0.56 0.52 0.57 0.63 0.56  CALCIUM  --   < > 7.8* 7.8* 7.9* 7.6* 7.8*  MG 1.3*  --   --   --  1.2*  --  1.3*  < > = values in this interval not displayed. GFR: Estimated Creatinine Clearance: 61.9 mL/min (by C-G formula based on SCr of 0.56 mg/dL). Liver Function Tests:  Recent Labs Lab 11/07/16 1530 11/09/16 0509 11/10/16 0646 11/12/16 0546  AST 37 54* 55* 42*  ALT _1 ALKPHOS 56 73 68 54  BILITOT 1.0 0.8 0.7 1.0  PROT 6.7 6.2* 6.4* 6.2*  ALBUMIN 2.4* 2.4* 2.4* 2.4*   No results for input(s): LIPASE, AMYLASE in the last 168 hours. No results for input(s): AMMONIA in the last 168 hours. Coagulation Profile: No results for input(s): INR, PROTIME in the last 168 hours. Cardiac Enzymes:  Recent Labs Lab 11/07/16 1525  TROPONINI <0.03   BNP (last 3 results) No results for input(s): PROBNP in the last 8760 hours. HbA1C: No results for input(s): HGBA1C in the last 72 hours. CBG:  Recent Labs Lab 11/12/16 0802 11/12/16 1229  GLUCAP 126* 147*   Lipid Profile: No results for input(s): CHOL, HDL, LDLCALC, TRIG, CHOLHDL, LDLDIRECT in the last 72 hours. Thyroid Function Tests: No  results for input(s): TSH, T4TOTAL, FREET4, T3FREE, THYROIDAB in the last 72 hours. Anemia Panel: No results for input(s): VITAMINB12, FOLATE, FERRITIN, TIBC, IRON, RETICCTPCT in the last 72 hours. Urine analysis:    Component Value Date/Time   COLORURINE YELLOW 11/07/2016 [place] 11/07/2016 1530   LABSPEC 1.013 11/07/2016 1530   PHURINE 7.0 11/07/2016 [place] 11/07/2016 1530   HGBUR NEGATIVE 11/07/2016 [place] 11/07/2016 [place] 11/07/2016 1530  PROTEINUR NEGATIVE 11/07/2016 1530   NITRITE NEGATIVE 11/07/2016 1530   LEUKOCYTESUR NEGATIVE 11/07/2016 1530   Sepsis Labs: _0 (procalcitonin:4,lacticidven:4)  ) Recent Results (from the past 240 hour(s))  Urine culture     Status: None   Collection Time: 11/07/16  3:30 PM  Result Value Ref Range Status   Specimen Description URINE, CATHETERIZED  Final   Special Requests NONE  Final   Culture   Final    NO GROWTH Performed at [facility], [place] [address] [place], [place] [address]    Report Status 11/09/2016 FINAL  Final         Radiology Studies: No results found.      Scheduled Meds: . amLODipine  5 mg Oral Daily  . apixaban  5 mg Oral BID  . docusate sodium  100 mg Oral BID  . furosemide  20 mg Intravenous BID  . levothyroxine  50 mcg Oral QAC breakfast  . magnesium oxide  400 mg Oral BID  . metoprolol tartrate  25 mg Oral BID  . sodium chloride flush  3 mL Intravenous Q12H  . sodium chloride  1 g Oral BID WC   Continuous Infusions: . 0.9 % NaCl with KCl 20 mEq / L 75 mL/hr at 11/13/16 1026     LOS: 6 days    Time spent: [place], MD [facility] ([facility]   If 7PM-7AM, please contact night-coverage [email or web address] Password [email or web address] 11/13/2016, 5:00 PM

## 2016-11-13 NOTE — Consult Note (Signed)
Cardiologist neurologist O even GI for the symptoms no [place] with a Negative. EACs really Reason for Consult: Hyponatremia Referring Physician: Dr. Guy Sandifer is an 83 y.o. female.  HPI: She is a patient to has history of for hypertension, hypothyroidism, diastolic dysfunction, presently admitted because of difficulty breathing, cough, congestion and atrial fibrillation. When she was evaluated patient was called also to have hyponatremia with sodium of 119. Presently her sodium has improved to 123 however her sodium remains at 123 for the last couple of days. Presently the patient denies any cough, no nausea or vomiting. She claims she is feeling better. Patient also denies any difficulty breathing.  Past Medical History:  Diagnosis Date  . Atrial fibrillation ([place])   . Essential hypertension   . Hypothyroidism   . Lumbago   . Neuralgia   . Neuritis     Past Surgical History:  Procedure Laterality Date  . ABDOMINAL HYSTERECTOMY    . APPENDECTOMY      History reviewed. No pertinent family history.  Social History:  reports that she has never smoked. She has never used smokeless tobacco. She reports that she does not drink alcohol or use drugs.  Allergies: No Known Allergies  Medications: I have reviewed the patient's current medications.  Results for orders placed or performed during the hospital encounter of 11/07/16 (from the past 48 hour(s))  Comprehensive metabolic panel     Status: Abnormal   Collection Time: 11/12/16  5:46 AM  Result Value Ref Range   Sodium 123 (L) 135 - 145 mmol/L   Potassium 3.7 3.5 - 5.1 mmol/L   Chloride 77 (L) 101 - 111 mmol/L   CO2 36 (H) 22 - 32 mmol/L   Glucose, Bld 121 (H) 65 - 99 mg/dL   BUN 12 6 - 20 mg/dL   Creatinine, Ser 0.63 0.44 - 1.00 mg/dL   Calcium 7.6 (L) 8.9 - 10.3 mg/dL   Total Protein 6.2 (L) 6.5 - 8.1 g/dL   Albumin 2.4 (L) 3.5 - 5.0 g/dL   AST 42 (H) 15 - 41 U/L   ALT 24 14 - 54 U/L   Alkaline Phosphatase 54  38 - 126 U/L   Total Bilirubin 1.0 0.3 - 1.2 mg/dL   GFR calc non Af Amer >60 >60 mL/min   GFR calc Af Amer >60 >60 mL/min    Comment: (NOTE) The eGFR has been calculated using the CKD EPI equation. This calculation has not been validated in all clinical situations. eGFR's persistently <60 mL/min signify possible Chronic Kidney Disease.    Anion gap 10 5 - 15  Glucose, capillary     Status: Abnormal   Collection Time: 11/12/16  8:02 AM  Result Value Ref Range   Glucose-Capillary 126 (H) 65 - 99 mg/dL   Comment 1 Notify RN    Comment 2 Document in Chart   Glucose, capillary     Status: Abnormal   Collection Time: 11/12/16 12:29 PM  Result Value Ref Range   Glucose-Capillary 147 (H) 65 - 99 mg/dL   Comment 1 Notify RN    Comment 2 Document in Chart   Basic metabolic panel     Status: Abnormal   Collection Time: 11/13/16  5:50 AM  Result Value Ref Range   Sodium 123 (L) 135 - 145 mmol/L   Potassium 3.5 3.5 - 5.1 mmol/L   Chloride 78 (L) 101 - 111 mmol/L   CO2 35 (H) 22 - 32 mmol/L   Glucose, Bld  131 (H) 65 - 99 mg/dL   BUN 13 6 - 20 mg/dL   Creatinine, Ser 0.56 0.44 - 1.00 mg/dL   Calcium 7.8 (L) 8.9 - 10.3 mg/dL   GFR calc non Af Amer >60 >60 mL/min   GFR calc Af Amer >60 >60 mL/min    Comment: (NOTE) The eGFR has been calculated using the CKD EPI equation. This calculation has not been validated in all clinical situations. eGFR's persistently <60 mL/min signify possible Chronic Kidney Disease.    Anion gap 10 5 - 15  Magnesium     Status: Abnormal   Collection Time: 11/13/16  5:50 AM  Result Value Ref Range   Magnesium 1.3 (L) 1.7 - 2.4 mg/dL    No results found.  ROS Blood pressure (!) 146/101, pulse 76, temperature 98.6 F (37 C), temperature source Oral, resp. rate 18, height _0  (1.676 m), weight 101.9 kg (224 lb 10.4 oz), SpO2 92 %. Physical Exam  Assessment/Plan: Problem #1 hyponatremia: Possibly hypovolemic hyponatremia however SIADH cannot be ruled  out. Her sodium is 123 and patient presently is asymptomatic. Initial workup showed urine sodium to be 79, potassium 40 and urine osmolality 330. Patient however is on diuretics. Serum osmolality was 253 Problem #2 hypomagnesemia and hypokalemia: Potassium has corrected but magnesium still low. Problem #3 hypertension: Her blood pressures reasonably controlled Problem #4 history of diastolic dysfunction: Patient on Lasix and presently she is asymptomatic and no sign of fluid overload Problem #5 history of a trial fibrillation Problem #6 history of hypothyroidism Plan: We'll start patient on normal saline with 20 mEq of KCl 75 cc per hour 2] will DC freewater intake/ice 3] will DC by mouth potassium 4] will decrease Lasix to 20 mg IV twice a day 5.] Will check uric acid level 6] will check renal panel in the morning.    Maeola Mchaney S 11/13/2016, 9:26 AM

## 2016-11-14 DIAGNOSIS — E038 Other specified hypothyroidism: Secondary | ICD-10-CM

## 2016-11-14 DIAGNOSIS — I10 Essential (primary) hypertension: Secondary | ICD-10-CM

## 2016-11-14 DIAGNOSIS — R739 Hyperglycemia, unspecified: Secondary | ICD-10-CM

## 2016-11-14 DIAGNOSIS — I5031 Acute diastolic (congestive) heart failure: Secondary | ICD-10-CM

## 2016-11-14 DIAGNOSIS — R531 Weakness: Secondary | ICD-10-CM

## 2016-11-14 LAB — RENAL FUNCTION PANEL
Albumin: 2.4 g/dL — ABNORMAL LOW (ref 3.5–5.0)
Anion gap: 14 (ref 5–15)
BUN: 12 mg/dL (ref 6–20)
CO2: 30 mmol/L (ref 22–32)
Calcium: 8 mg/dL — ABNORMAL LOW (ref 8.9–10.3)
Chloride: 82 mmol/L — ABNORMAL LOW (ref 101–111)
Creatinine, Ser: 0.56 mg/dL (ref 0.44–1.00)
GFR calc Af Amer: >60 mL/min
GFR calc non Af Amer: >60 mL/min
Glucose, Bld: 115 mg/dL — ABNORMAL HIGH (ref 65–99)
Phosphorus: 2.6 mg/dL (ref 2.5–4.6)
Potassium: 3.9 mmol/L (ref 3.5–5.1)
Sodium: 126 mmol/L — ABNORMAL LOW (ref 135–145)

## 2016-11-14 LAB — URIC ACID: URIC ACID, SERUM: 6.6 mg/dL (ref 2.3–6.6)

## 2016-11-14 NOTE — Progress Notes (Signed)
PROGRESS NOTE    Yvonne Chan  DGL:[id number] DOB: 07-16-1929 DOA: 11/07/2016 PCP: Vesta Mixer    Brief Narrative:  83 y/o ? hypertension, hypothyroidism and recent admission 1/25-1/30/18 for A. fib with RVR acute diastolic failure--on that admission found to be 6 L negative continued on oral Lasix and discharged home. Returned to the hospital with overall weakness some shortness of breath and inability to ambulate. Restarted on Lasix and placed on fluid restriction as found to be hyponatremic to 119 range.    Assessment & Plan:   Principal Problem:   Hyponatremia Active Problems:   Atrial fibrillation with RVR (HCC)   HTN (hypertension)   Hypothyroidism   Acute diastolic CHF (congestive heart failure) (HCC)   Hyperglycemia   Weakness   Hypervolemic hyponatremia vs Reset Osmostat or SAIDH Random urine sodium is 79 which is relatively low, osmolality 253 also low Continue fluid restriction 1200 cc, remove water jug out of room, encouraged salt intake Lasix 28m IV BID Sodium 120--->125-->123-->126 wght 109 -->100 KG  TSH wnl and Cortisol nl at 20  Appreciate input from nephrology  Atrial fibrillation with RVR  CMaliscore 5 on Elliquis Continue metoprolol 25 twice a day early.  Acute hypoxic respiratory failure Acute diastolic heart failure Worsening #1 cxr rpt 2/3 shows mild pulm edema Strict I's and O's -6.2 liters so far Passed desat screen--no longer needing oxygen at rest  Htn Poorly controlled D/c off of Aldomet-added 534mNorvasc 2/5  Weakness Probably secondary to hyponatremia PTOT to see patient. Patient is a difficult time sitting up Needs SNF SW consulted  Dysphagia Past month the daughter relates that patient has not been eating solid foods which may contribute to the hyponatremia When questioned patient states she does not feel of bolus or sticking of food rather states that she gets tired chewing Place on dysphagia 2 diet Good PO  intake per patient and daughter  Hypokalemia 3.9 this am Recheck magensium in am was 1.2,  11/11/16 Replace mag-ox 400 bid->added IV Madnesium 2/6   Mild met encephalopathy Possibly sundowning Avoid narcotics or sleep aids   DVT prophylaxis: eliquis Code Status: Full code Family Communication: no family bedside Disposition Plan: PT recommending SNF   Consultants:   PT  Nephrology  Procedures:   None  Antimicrobials:   None    Subjective: Patient asking when she will be stable to leave.  Daughter is bedside and reports that she feels as though patient has improved.  All questions answered.  Explained need for continued monitoring with low sodium level.  Objective: Vitals:   11/13/16 1315 11/13/16 2141 11/14/16 0530 11/14/16 0838  BP: (!) 142/91 (!) 141/102 (!) 149/120 (!) 131/98  Pulse: 81 88 (!) 106 95  Resp: _0 Temp: 98.4 F (36.9 C) 98.4 F (36.9 C) 98.1 F (36.7 C)   TempSrc: Oral Oral Oral   SpO2: 96% 93% 94%   Weight:   100.8 kg (222 lb 3.6 oz)   Height:        Intake/Output Summary (Last 24 hours) at 11/14/16 1331 Last data filed at 11/14/16 0309  Gross per 24 hour  Intake          1303.75 ml  Output              800 ml  Net           503.75 ml   Filed Weights   11/12/16 0613 11/13/16 0550 11/14/16 0530  Weight: 102.8  kg (226 lb 10.1 oz) 101.9 kg (224 lb 10.4 oz) 100.8 kg (222 lb 3.6 oz)    Examination:  General exam: Appears calm and comfortable  Respiratory system: Clear to auscultation. Respiratory effort normal. Cardiovascular system: S1 & S2 heard, RRR. No JVD, murmurs, rubs, gallops or clicks. No pedal edema. Gastrointestinal system: Abdomen is nondistended, soft and nontender. No organomegaly or masses felt. Normal bowel sounds heard. Central nervous system: Alert and oriented. No focal neurological deficits. Extremities: Symmetric 5 x 5 power. Skin: No rashes, lesions or ulcers Psychiatry: Judgement and insight appear  normal. Mood & affect appropriate.     Data Reviewed: I have personally reviewed following labs and imaging studies  CBC:  Recent Labs Lab 11/07/16 1525 11/08/16 0505 11/10/16 0646  WBC 14.3* 11.3* 7.1  NEUTROABS 13.0*  --  5.3  HGB 14.0 13.6 13.3  HCT 40.2 39.2 38.7  MCV 87.4 87.5 88.2  PLT 360 383 361   Basic Metabolic Panel:  Recent Labs Lab 11/08/16 0340  11/10/16 0646 11/11/16 0652 11/12/16 0546 11/13/16 0550 11/14/16 0459  NA  --   < > 122* 125* 123* 123* 126*  K  --   < > 3.3* 3.6 3.7 3.5 3.9  CL  --   < > 78* 78* 77* 78* 82*  CO2  --   < > 34* 36* 36* 35* 30  GLUCOSE  --   < > 116* 111* 121* 131* 115*  BUN  --   < > _0 CREATININE  --   < > 0.52 0.57 0.63 0.56 0.56  CALCIUM  --   < > 7.8* 7.9* 7.6* 7.8* 8.0*  MG 1.3*  --   --  1.2*  --  1.3*  --   PHOS  --   --   --   --   --   --  2.6  < > = values in this interval not displayed. GFR: Estimated Creatinine Clearance: 61.6 mL/min (by C-G formula based on SCr of 0.56 mg/dL). Liver Function Tests:  Recent Labs Lab 11/07/16 1530 11/09/16 0509 11/10/16 0646 11/12/16 0546 11/14/16 0459  AST 37 54* 55* 42*  --   ALT _1 --   ALKPHOS 56 73 68 54  --   BILITOT 1.0 0.8 0.7 1.0  --   PROT 6.7 6.2* 6.4* 6.2*  --   ALBUMIN 2.4* 2.4* 2.4* 2.4* 2.4*   No results for input(s): LIPASE, AMYLASE in the last 168 hours. No results for input(s): AMMONIA in the last 168 hours. Coagulation Profile: No results for input(s): INR, PROTIME in the last 168 hours. Cardiac Enzymes:  Recent Labs Lab 11/07/16 1525  TROPONINI <0.03   BNP (last 3 results) No results for input(s): PROBNP in the last 8760 hours. HbA1C: No results for input(s): HGBA1C in the last 72 hours. CBG:  Recent Labs Lab 11/12/16 0802 11/12/16 1229  GLUCAP 126* 147*   Lipid Profile: No results for input(s): CHOL, HDL, LDLCALC, TRIG, CHOLHDL, LDLDIRECT in the last 72 hours. Thyroid Function Tests: No results for  input(s): TSH, T4TOTAL, FREET4, T3FREE, THYROIDAB in the last 72 hours. Anemia Panel: No results for input(s): VITAMINB12, FOLATE, FERRITIN, TIBC, IRON, RETICCTPCT in the last 72 hours. Sepsis Labs: No results for input(s): PROCALCITON, LATICACIDVEN in the last 168 hours.  Recent Results (from the past 240 hour(s))  Urine culture     Status: None   Collection Time: 11/07/16  3:30 PM  Result Value Ref Range Status   Specimen Description URINE, CATHETERIZED  Final   Special Requests NONE  Final   Culture   Final    NO GROWTH Performed at [facility], [address] [place], [place] [address]    Report Status 11/09/2016 FINAL  Final         Radiology Studies: No results found.      Scheduled Meds: . amLODipine  5 mg Oral Daily  . apixaban  5 mg Oral BID  . docusate sodium  100 mg Oral BID  . furosemide  20 mg Intravenous BID  . levothyroxine  50 mcg Oral QAC breakfast  . magnesium oxide  400 mg Oral BID  . metoprolol tartrate  25 mg Oral BID  . sodium chloride flush  3 mL Intravenous Q12H  . sodium chloride  1 g Oral BID WC   Continuous Infusions: . 0.9 % NaCl with KCl 20 mEq / L 75 mL/hr at 11/13/16 1026     LOS: 7 days    Time spent: 30 minutes    Loretha Stapler, MD [facility] Pager [phone]  If 7PM-7AM, please contact night-coverage [email or web address] Password [email or web address] 11/14/2016, 1:31 PM

## 2016-11-14 NOTE — Progress Notes (Signed)
Romero BellingMary Confer  MRN: [id number]  DOB/AGE: 1928/11/08 83 y.o.  Primary Care Physician:BAUCOM, JENNY B, PA-C  Admit date: 11/07/2016  Chief Complaint:  Chief Complaint  Patient presents with  . Shortness of Breath  . Weakness    S-Pt presented on  11/07/2016 with  Chief Complaint  Patient presents with  . Shortness of Breath  . Weakness  .    Pt laying comfortably.    Pt voices no new concerns.   Marland Kitchen. amLODipine  5 mg Oral Daily  . apixaban  5 mg Oral BID  . docusate sodium  100 mg Oral BID  . furosemide  20 mg Intravenous BID  . levothyroxine  50 mcg Oral QAC breakfast  . magnesium oxide  400 mg Oral BID  . metoprolol tartrate  25 mg Oral BID  . sodium chloride flush  3 mL Intravenous Q12H  . sodium chloride  1 g Oral BID WC       Physical Exam: Vital signs in last 24 hours: Temp:  [98.1 F (36.7 C)-98.4 F (36.9 C)] 98.1 F (36.7 C) (02/07 0530) Pulse Rate:  [81-106] 95 (02/07 0838) Resp:  [18-20] 18 (02/07 0838) BP: (131-149)/(91-120) 131/98 (02/07 0838) SpO2:  [93 %-96 %] 94 % (02/07 0530) Weight:  [222 lb 3.6 oz (100.8 kg)] 222 lb 3.6 oz (100.8 kg) (02/07 0530) Weight change: -2 lb 6.8 oz (-1.1 kg) Last BM Date: 11/13/16  Intake/Output from previous day: 02/06 0701 - 02/07 0700 In: 1543.8 [P.O.:240; I.V.:1253.8; IV Piggyback:50] Out: 800 [Urine:800] No intake/output data recorded.   Physical Exam: General- pt is awake,alert, oriented to time place and person Resp- No acute REsp distress, CTA B/L NO Rhonchi CVS- S1S2 regular in rate and rhythm GIT- BS+, soft, NT, ND EXT- NO LE Edema, Cyanosis   Lab Results: Hgb 13.3  BMET  Recent Labs  11/13/16 0550 11/14/16 0459  NA 123* 126*  K 3.5 3.9  CL 78* 82*  CO2 35* 30  GLUCOSE 131* 115*  BUN 13 12  CREATININE 0.56 0.56  CALCIUM 7.8* 8.0*    MICRO Recent Results (from the past 240 hour(s))  Urine culture     Status: None   Collection Time: 11/07/16  3:30 PM  Result Value Ref Range Status   Specimen Description URINE, CATHETERIZED  Final   Special Requests NONE  Final   Culture   Final    NO GROWTH Performed at [facility], [address] [place], [place] [id number]    Report Status 11/09/2016 FINAL  Final      Lab Results  Component Value Date   CALCIUM 8.0 (L) 11/14/2016   PHOS 2.6 11/14/2016               Impression: 1)Hyponatremia     Sodium  119==>126     Improving     Will continue current tx  2)HTN  Medication- On Diuretics. On Calcium Channel Blockers On Beta blockers   3)REsp- admitted with resp failure   Now better  4)CHF- hx of Diastolic dysfinction  stable  5)Hypothyroidsim PMD following  6)Acid base Co2 at goal     Plan:  Will continue current tx and plan Will follow bmet      Kami Kube S 11/14/2016, 10:25 AM

## 2016-11-14 NOTE — Progress Notes (Signed)
Patients BP high.   On call MD notified.  Order to give 10 AM BP medications now.  Received and carried out.  Will continue to monitor.

## 2016-11-15 DIAGNOSIS — I4891 Unspecified atrial fibrillation: Secondary | ICD-10-CM

## 2016-11-15 LAB — BASIC METABOLIC PANEL
Anion gap: 9 (ref 5–15)
BUN: 12 mg/dL (ref 6–20)
CO2: 32 mmol/L (ref 22–32)
CREATININE: 0.66 mg/dL (ref 0.44–1.00)
Calcium: 7.8 mg/dL — ABNORMAL LOW (ref 8.9–10.3)
Chloride: 88 mmol/L — ABNORMAL LOW (ref 101–111)
Glucose, Bld: 103 mg/dL — ABNORMAL HIGH (ref 65–99)
POTASSIUM: 4.2 mmol/L (ref 3.5–5.1)
SODIUM: 129 mmol/L — AB (ref 135–145)

## 2016-11-15 LAB — MAGNESIUM: Magnesium: 1.8 mg/dL (ref 1.7–2.4)

## 2016-11-15 NOTE — Progress Notes (Signed)
Subjective: Interval History: has no complaint of nausea or vomiting. Patient however with poor appetite. She denies any difficulty breathing..  Objective: Vital signs in last 24 hours: Temp:  [97.8 F (36.6 C)-100.3 F (37.9 C)] 100.3 F (37.9 C) (02/08 0509) Pulse Rate:  [90-137] 104 (02/08 0509) Resp:  [18] 18 (02/08 0509) BP: (118-131)/(79-96) 131/79 (02/08 0509) SpO2:  [92 %-96 %] 92 % (02/08 0509) Weight:  [103.4 kg (227 lb 15.3 oz)] 103.4 kg (227 lb 15.3 oz) (02/08 0509) Weight change: 2.6 kg (5 lb 11.7 oz)  Intake/Output from previous day: 02/07 0701 - 02/08 0700 In: 2161.3 [P.O.:360; I.V.:1001.3] Out: 200 [Urine:200] Intake/Output this shift: Total I/O In: 260 [P.O.:260] Out: -   General appearance: alert, cooperative and no distress Resp: clear to auscultation bilaterally Cardio: regular rate and rhythm Extremities: No edema  Lab Results: No results for input(s): WBC, HGB, HCT, PLT in the last 72 hours. BMET:  Recent Labs  11/14/16 0459 11/15/16 0437  NA 126* 129*  K 3.9 4.2  CL 82* 88*  CO2 30 32  GLUCOSE 115* 103*  BUN 12 12  CREATININE 0.56 0.66  CALCIUM 8.0* 7.8*   No results for input(s): PTH in the last 72 hours. Iron Studies: No results for input(s): IRON, TIBC, TRANSFERRIN, FERRITIN in the last 72 hours.  Studies/Results: No results found.  I have reviewed the patient's current medications.  Assessment/Plan: Problem #1 hyponatremia: Most likely hypovolemic hyponatremia. Presently she is on free water restriction. Her sodium is 129 continuously improving. Patient is asymptomatic. Problem #2 hypokalemia: Patient on potassium supplement her potassium is normal Problem #3 hypertension: Blood pressure is reasonably controlled Problem #4 history of hypothyroidism: She is on Synthroid Problem #5 history of atrial fibrillation: Her heart rate is controlled Problem #6 history of diastolic dysfunction: Presently patient is asymptomatic. Plan: 1]  We'll DC IV fluid with potassium 2] will continue with free water restriction 3] We'll check her renal panel in the morning.    LOS: 8 days   Rahmel Nedved S 11/15/2016,11:34 AM

## 2016-11-15 NOTE — Progress Notes (Signed)
PROGRESS NOTE    Yvonne Chan  WLN:[id number] DOB: Aug 09, 1929 DOA: 11/07/2016 PCP: Vesta Mixer    Brief Narrative:  83 y/o ? hypertension, hypothyroidism and recent admission 1/25-1/30/18 for A. fib with RVR acute diastolic failure--on that admission found to be 6 L negative continued on oral Lasix and discharged home. Returned to the hospital with overall weakness some shortness of breath and inability to ambulate. Restarted on Lasix and placed on fluid restriction as found to be hyponatremic to 119 range.    Assessment & Plan:   Principal Problem:   Hyponatremia Active Problems:   Atrial fibrillation with RVR (HCC)   HTN (hypertension)   Hypothyroidism   Acute diastolic CHF (congestive heart failure) (HCC)   Hyperglycemia   Weakness   Hypervolemic hyponatremia vs Reset Osmostat or SAIDH Random urine sodium is 79 which is relatively low, osmolality 253 also low Continue fluid restriction 1200 cc, remove water jug out of room, encouraged salt intake Lasix 3m IV BID Sodium 120--->125-->123-->126--> 129  TSH wnl and Cortisol nl at 20  Appreciate input from nephrology  Atrial fibrillation with RVR  CMaliscore 5 on Elliquis Continue metoprolol 25 twice a day  Acute hypoxic respiratory failure Acute diastolic heart failure Worsening #1 cxr rpt 2/3 shows mild pulm edema Strict I's and O's Passed desat screen--no longer needing oxygen at rest  Htn Poorly controlled D/c off of Aldomet added 543mNorvasc Blood pressure better controlled  Weakness Probably secondary to hyponatremia PTOT consulted. Patient is a difficult time sitting up Needs SNF SW consulted  Dysphagia Past month the daughter relates that patient has not been eating solid foods which may contribute to the hyponatremia When questioned patient states she does not feel of bolus or sticking of food rather states that she gets tired chewing Place on dysphagia 2 diet  Hypokalemia 4.2 this  am Recheck magensium was 1.8   Mild met encephalopathy Possibly sundowning Avoid narcotics or sleep aids   DVT prophylaxis: eliquis Code Status: Full code Family Communication: no family bedside Disposition Plan: PT recommending SNF   Consultants:   PT  Nephrology  Procedures:   None  Antimicrobials:   None    Subjective: Patient hoping to be able to leave.  States she does not believe she needs a SNF.  Daughter was not bedside.  Sodium up to 129 today.  Objective: Vitals:   11/14/16 1300 11/14/16 2158 11/15/16 0509 11/15/16 1520  BP: 124/88 (!) 118/96 131/79 126/87  Pulse: 90 (!) 137 (!) 104 97  Resp: _0 Temp: 99 F (37.2 C) 97.8 F (36.6 C) 100.3 F (37.9 C) 98 F (36.7 C)  TempSrc: Oral Oral Oral Oral  SpO2: 92% 96% 92% 91%  Weight:   103.4 kg (227 lb 15.3 oz)   Height:        Intake/Output Summary (Last 24 hours) at 11/15/16 1807 Last data filed at 11/15/16 1455  Gross per 24 hour  Intake              260 ml  Output              550 ml  Net             -290 ml   Filed Weights   11/13/16 0550 11/14/16 0530 11/15/16 0509  Weight: 101.9 kg (224 lb 10.4 oz) 100.8 kg (222 lb 3.6 oz) 103.4 kg (227 lb 15.3 oz)    Examination:  General exam: Appears calm  and comfortable  Respiratory system: Clear to auscultation. Respiratory effort normal. Cardiovascular system: S1 & S2 heard, RRR. No JVD, murmurs, rubs, gallops or clicks. No pedal edema. Gastrointestinal system: Abdomen is nondistended, soft and nontender. No organomegaly or masses felt. Normal bowel sounds heard. Central nervous system: Alert and oriented. No focal neurological deficits. Extremities: Symmetric 5 x 5 power. Skin: No rashes, lesions or ulcers Psychiatry:  Mood & affect appropriate.     Data Reviewed: I have personally reviewed following labs and imaging studies  CBC:  Recent Labs Lab 11/10/16 0646  WBC 7.1  NEUTROABS 5.3  HGB 13.3  HCT 38.7  MCV 88.2  PLT  765   Basic Metabolic Panel:  Recent Labs Lab 11/11/16 0652 11/12/16 0546 11/13/16 0550 11/14/16 0459 11/15/16 0437  NA 125* 123* 123* 126* 129*  K 3.6 3.7 3.5 3.9 4.2  CL 78* 77* 78* 82* 88*  CO2 36* 36* 35* 30 32  GLUCOSE 111* 121* 131* 115* 103*  BUN _0 CREATININE 0.57 0.63 0.56 0.56 0.66  CALCIUM 7.9* 7.6* 7.8* 8.0* 7.8*  MG 1.2*  --  1.3*  --  1.8  PHOS  --   --   --  2.6  --    GFR: Estimated Creatinine Clearance: 62.4 mL/min (by C-G formula based on SCr of 0.66 mg/dL). Liver Function Tests:  Recent Labs Lab 11/09/16 0509 11/10/16 0646 11/12/16 0546 11/14/16 0459  AST 54* 55* 42*  --   ALT _1 --   ALKPHOS 73 68 54  --   BILITOT 0.8 0.7 1.0  --   PROT 6.2* 6.4* 6.2*  --   ALBUMIN 2.4* 2.4* 2.4* 2.4*   No results for input(s): LIPASE, AMYLASE in the last 168 hours. No results for input(s): AMMONIA in the last 168 hours. Coagulation Profile: No results for input(s): INR, PROTIME in the last 168 hours. Cardiac Enzymes: No results for input(s): CKTOTAL, CKMB, CKMBINDEX, TROPONINI in the last 168 hours. BNP (last 3 results) No results for input(s): PROBNP in the last 8760 hours. HbA1C: No results for input(s): HGBA1C in the last 72 hours. CBG:  Recent Labs Lab 11/12/16 0802 11/12/16 1229  GLUCAP 126* 147*   Lipid Profile: No results for input(s): CHOL, HDL, LDLCALC, TRIG, CHOLHDL, LDLDIRECT in the last 72 hours. Thyroid Function Tests: No results for input(s): TSH, T4TOTAL, FREET4, T3FREE, THYROIDAB in the last 72 hours. Anemia Panel: No results for input(s): VITAMINB12, FOLATE, FERRITIN, TIBC, IRON, RETICCTPCT in the last 72 hours. Sepsis Labs: No results for input(s): PROCALCITON, LATICACIDVEN in the last 168 hours.  Recent Results (from the past 240 hour(s))  Urine culture     Status: None   Collection Time: 11/07/16  3:30 PM  Result Value Ref Range Status   Specimen Description URINE, CATHETERIZED  Final   Special Requests  NONE  Final   Culture   Final    NO GROWTH Performed at [facility], [address] [place], [place] [address]    Report Status 11/09/2016 FINAL  Final         Radiology Studies: No results found.      Scheduled Meds: . amLODipine  5 mg Oral Daily  . apixaban  5 mg Oral BID  . docusate sodium  100 mg Oral BID  . furosemide  20 mg Intravenous BID  . levothyroxine  50 mcg Oral QAC breakfast  . magnesium oxide  400 mg Oral BID  .  metoprolol tartrate  25 mg Oral BID  . sodium chloride flush  3 mL Intravenous Q12H  . sodium chloride  1 g Oral BID WC   Continuous Infusions:    LOS: 8 days    Time spent: 30 minutes    Loretha Stapler, MD [facility] Pager [phone]  If 7PM-7AM, please contact night-coverage [email or web address] Password [facility] 11/15/2016, 6:07 PM

## 2016-11-16 ENCOUNTER — Encounter (HOSPITAL_COMMUNITY)
Admission: AD | Admit: 2016-11-16 | Discharge: 2016-11-16 | Disposition: A | Discharge status: Home / Self Care | Payer: Medicare HMO | Source: Skilled Nursing Facility | Attending: Internal Medicine | Admitting: Internal Medicine

## 2016-11-16 ENCOUNTER — Non-Acute Institutional Stay (SKILLED_NURSING_FACILITY): Payer: Medicare HMO | Admitting: Internal Medicine

## 2016-11-16 ENCOUNTER — Observation Stay (HOSPITAL_COMMUNITY)
Admission: EM | Admit: 2016-11-16 | Discharge: 2016-11-18 | Disposition: A | Discharge status: Home / Self Care | Payer: Medicare HMO | Attending: Internal Medicine | Admitting: Internal Medicine

## 2016-11-16 ENCOUNTER — Encounter (HOSPITAL_COMMUNITY): Payer: Self-pay | Admitting: Emergency Medicine

## 2016-11-16 DIAGNOSIS — R05 Cough: Secondary | ICD-10-CM | POA: Diagnosis not present

## 2016-11-16 DIAGNOSIS — J111 Influenza due to unidentified influenza virus with other respiratory manifestations: Secondary | ICD-10-CM | POA: Diagnosis not present

## 2016-11-16 DIAGNOSIS — I10 Essential (primary) hypertension: Secondary | ICD-10-CM | POA: Diagnosis present

## 2016-11-16 DIAGNOSIS — I5031 Acute diastolic (congestive) heart failure: Secondary | ICD-10-CM | POA: Insufficient documentation

## 2016-11-16 DIAGNOSIS — E039 Hypothyroidism, unspecified: Secondary | ICD-10-CM | POA: Diagnosis present

## 2016-11-16 DIAGNOSIS — E871 Hypo-osmolality and hyponatremia: Secondary | ICD-10-CM | POA: Diagnosis not present

## 2016-11-16 DIAGNOSIS — J101 Influenza due to other identified influenza virus with other respiratory manifestations: Secondary | ICD-10-CM | POA: Insufficient documentation

## 2016-11-16 DIAGNOSIS — Z79899 Other long term (current) drug therapy: Secondary | ICD-10-CM | POA: Insufficient documentation

## 2016-11-16 DIAGNOSIS — I4891 Unspecified atrial fibrillation: Secondary | ICD-10-CM

## 2016-11-16 DIAGNOSIS — R059 Cough, unspecified: Secondary | ICD-10-CM

## 2016-11-16 DIAGNOSIS — N390 Urinary tract infection, site not specified: Secondary | ICD-10-CM

## 2016-11-16 DIAGNOSIS — I110 Hypertensive heart disease with heart failure: Secondary | ICD-10-CM | POA: Insufficient documentation

## 2016-11-16 LAB — INFLUENZA PANEL BY PCR (TYPE A & B)
INFLAPCR: POSITIVE — AB
INFLBPCR: NEGATIVE

## 2016-11-16 LAB — RENAL FUNCTION PANEL
ANION GAP: 6 (ref 5–15)
Albumin: 2.4 g/dL — ABNORMAL LOW (ref 3.5–5.0)
BUN: 13 mg/dL (ref 6–20)
CALCIUM: 8.2 mg/dL — AB (ref 8.9–10.3)
CO2: 35 mmol/L — ABNORMAL HIGH (ref 22–32)
CREATININE: 0.63 mg/dL (ref 0.44–1.00)
Chloride: 88 mmol/L — ABNORMAL LOW (ref 101–111)
Glucose, Bld: 126 mg/dL — ABNORMAL HIGH (ref 65–99)
PHOSPHORUS: 3.4 mg/dL (ref 2.5–4.6)
Potassium: 4.2 mmol/L (ref 3.5–5.1)
SODIUM: 129 mmol/L — AB (ref 135–145)

## 2016-11-16 MED ORDER — AMLODIPINE BESYLATE 5 MG PO TABS: 5.0000 mg | ORAL_TABLET | Freq: Every day | ORAL | 0 refills | Status: DC

## 2016-11-16 MED ORDER — SODIUM CHLORIDE 1 G PO TABS: 1.0000 g | ORAL_TABLET | Freq: Two times a day (BID) | ORAL | 0 refills | Status: DC

## 2016-11-16 NOTE — ED Provider Notes (Signed)
[facility]-EM[facility]Y DEPT Provider Note   CSN: [id number] Arrival date & time: 11/16/16  1921  By signing my name below, I, Teofilo Pod, attest that this documentation has been prepared under the direction and in the presence of Devoria Albe, MD . Electronically Signed: Teofilo Pod, ED Scribe. 11/17/2016. 12:15 AM.  Pt seen at 12:05 AM   History   Chief Complaint Chief Complaint  Patient presents with  . Influenza    tested positive at the [facility] at 4, they will not accept   LEVEL 5 CAVEAT DUE TO ACUITY OF CONDITION The history is provided by the patient. No language interpreter was used.   HPI Comments:  Yvonne Chan is a 83 y.o. female who presents to the Emergency Department with her son, here with multiple flu symptoms x 1 week. Son reports that pt has had associated chills x 1 week, decreased appetite, persistent cough, generalized body aches, SOB and confusion. Daughter states that pt was not confused this morning. Pt does not use NCO2 at home. Pt tested positive for the flu at 1600 this evening at the nursing home and they refused to admit her.  Son reports that pt was admitted for CHF 2 times in the past 2 weeks ago and was discharged from the hospital today and sent to assisted living for strengthening. She had been living at home prior to being admitted to the hospital.   PCP Abran Richard, PA-C   Past Medical History:  Diagnosis Date  . Atrial fibrillation (HCC)   . Essential hypertension   . Hypothyroidism   . Lumbago   . Neuralgia   . Neuritis     Patient Active Problem List   Diagnosis Date Noted  . Influenza 11/17/2016  . Hyponatremia 11/07/2016  . Hyperglycemia 11/07/2016  . Weakness 11/07/2016  . Atrial fibrillation with RVR (HCC) 11/01/2016  . HTN (hypertension) 11/01/2016  . Hypothyroidism 11/01/2016  . Acute diastolic CHF (congestive heart failure) (HCC) 11/01/2016    Past Surgical History:  Procedure Laterality Date  . ABDOMINAL  HYSTERECTOMY    . APPENDECTOMY      OB History    Gravida Para Term Preterm AB Living   10         5   SAB TAB Ectopic Multiple Live Births                   Home Medications    Prior to Admission medications   Medication Sig Start Date End Date Taking? Authorizing Provider  amLODipine (NORVASC) 5 MG tablet Take 1 tablet (5 mg total) by mouth daily. 11/17/16  Yes Filbert Schilder, MD  apixaban (ELIQUIS) 5 MG TABS tablet Take 1 tablet (5 mg total) by mouth 2 (two) times daily. 11/06/16  Yes Estela Isaiah Blakes, MD  furosemide (LASIX) 40 MG tablet Take 1 tablet (40 mg total) by mouth daily. 11/07/16  Yes Estela Isaiah Blakes, MD  guaiFENesin (MUCINEX) 600 MG 12 hr tablet Take by mouth 2 (two) times daily.   Yes Historical Provider, MD  ipratropium (ATROVENT) 0.02 % nebulizer solution Take 0.5 mg by nebulization every 6 (six) hours as needed for wheezing or shortness of breath.   Yes Historical Provider, MD  levothyroxine (SYNTHROID, LEVOTHROID) 50 MCG tablet Take 50 mcg by mouth daily before breakfast.   Yes Historical Provider, MD  methyldopa (ALDOMET) 250 MG tablet Take 250 mg by mouth daily.   Yes Historical Provider, MD  metoprolol tartrate (LOPRESSOR)  25 MG tablet Take 1 tablet (25 mg total) by mouth 2 (two) times daily. 11/06/16  Yes Estela Isaiah Blakes, MD  Omega-3 Fatty Acids (FISH OIL) 1000 MG CAPS Take 1 capsule by mouth daily.   Yes Historical Provider, MD  OXYGEN Inhale 2 L into the lungs daily as needed. To keep sats greater or equal to 90%   Yes Historical Provider, MD  potassium chloride (K-DUR) 10 MEQ tablet Take 10 mEq by mouth daily.   Yes Historical Provider, MD  sodium chloride 1 g tablet Take 1 tablet (1 g total) by mouth 2 (two) times daily with a meal. 11/16/16  Yes Filbert Schilder, MD    Family History No family history on file.  Social History Social History  Substance Use Topics  . Smoking status: Never Smoker  . Smokeless tobacco:  Never Used  . Alcohol use No  was living at home and living alone   Allergies   Patient has no known allergies.   Review of Systems Review of Systems  Unable to perform ROS: Acuity of condition     Physical Exam Updated Vital Signs ED Triage Vitals  Enc Vitals Group     BP 11/16/16 1931 127/69     Pulse Rate 11/16/16 1931 94     Resp 11/16/16 1931 20     Temp 11/16/16 1931 98.4 F (36.9 C)     Temp Source 11/16/16 1931 Oral     SpO2 11/16/16 1931 95 %     Weight 11/16/16 1930 227 lb (103 kg)     Height 11/16/16 1930 5\' 6"  (1.676 m)     Head Circumference --      Peak Flow --      Pain Score 11/16/16 1933 0     Pain Loc --      Pain Edu? --      Excl. in GC? --    Vital signs normal    Physical Exam  Constitutional: She appears well-developed and well-nourished.  Non-toxic appearance. She does not appear ill. No distress.  HENT:  Head: Normocephalic and atraumatic.  Right Ear: External ear normal.  Left Ear: External ear normal.  Nose: Nose normal. No mucosal edema or rhinorrhea.  Mouth/Throat: Mucous membranes are normal. No dental abscesses or uvula swelling.  Tongue dry.  Eyes: Conjunctivae and EOM are normal. Pupils are equal, round, and reactive to light.  Neck: Normal range of motion and full passive range of motion without pain. Neck supple.  Cardiovascular: Normal rate, regular rhythm and normal heart sounds.  Exam reveals no gallop and no friction rub.   No murmur heard. Pulmonary/Chest: Accessory muscle usage present. Tachypnea noted. No respiratory distress. She has no wheezes. She has no rhonchi. She has rales. She exhibits no tenderness and no crepitus.  Pt unable to sit up for lung exam.   Abdominal: Soft. Normal appearance and bowel sounds are normal. She exhibits no distension. There is no tenderness. There is no rebound and no guarding.  Musculoskeletal: Normal range of motion. She exhibits no edema or tenderness.  Moves all extremities well.     Neurological: She is alert. She has normal strength. She is disoriented. No cranial nerve deficit.  She does not answer many questions and seems confused.  Skin: Skin is warm, dry and intact. No rash noted. No erythema. No pallor.  Psychiatric: She has a normal mood and affect. Her speech is normal and behavior is normal. Her mood appears not anxious.  Nursing note and vitals reviewed.    ED Treatments / Results  Labs (all labs ordered are listed, but only abnormal results are displayed) Results for orders placed or performed during the hospital encounter of 11/16/16  Culture, blood (routine x 2)  Result Value Ref Range   Specimen Description BLOOD LEFT HAND    Special Requests BOTTLES DRAWN AEROBIC ONLY 6CC    Culture PENDING    Report Status PENDING   Culture, blood (routine x 2)  Result Value Ref Range   Specimen Description BLOOD LEFT ARM    Special Requests BOTTLES DRAWN AEROBIC AND ANAEROBIC 6CC    Culture PENDING    Report Status PENDING   Comprehensive metabolic panel  Result Value Ref Range   Sodium 129 (L) 135 - 145 mmol/L   Potassium 4.4 3.5 - 5.1 mmol/L   Chloride 88 (L) 101 - 111 mmol/L   CO2 33 (H) 22 - 32 mmol/L   Glucose, Bld 106 (H) 65 - 99 mg/dL   BUN 16 6 - 20 mg/dL   Creatinine, Ser 1.610.70 0.44 - 1.00 mg/dL   Calcium 8.6 (L) 8.9 - 10.3 mg/dL   Total Protein 6.8 6.5 - 8.1 g/dL   Albumin 2.5 (L) 3.5 - 5.0 g/dL   AST 25 15 - 41 U/L   ALT 20 14 - 54 U/L   Alkaline Phosphatase 53 38 - 126 U/L   Total Bilirubin 0.9 0.3 - 1.2 mg/dL   GFR calc non Af Amer >60 >60 mL/min   GFR calc Af Amer >60 >60 mL/min   Anion gap 8 5 - 15  CBC with Differential  Result Value Ref Range   WBC 11.3 (H) 4.0 - 10.5 K/uL   RBC 4.64 3.87 - 5.11 MIL/uL   Hemoglobin 14.0 12.0 - 15.0 g/dL   HCT 09.642.7 04.536.0 - 40.946.0 %   MCV 92.0 78.0 - 100.0 fL   MCH 30.2 26.0 - 34.0 pg   MCHC 32.8 30.0 - 36.0 g/dL   RDW 81.113.1 91.411.5 - 78.215.5 %   Platelets 389 150 - 400 K/uL   Neutrophils Relative % 80  %   Neutro Abs 9.1 (H) 1.7 - 7.7 K/uL   Lymphocytes Relative 11 %   Lymphs Abs 1.3 0.7 - 4.0 K/uL   Monocytes Relative 7 %   Monocytes Absolute 0.7 0.1 - 1.0 K/uL   Eosinophils Relative 2 %   Eosinophils Absolute 0.2 0.0 - 0.7 K/uL   Basophils Relative 0 %   Basophils Absolute 0.0 0.0 - 0.1 K/uL  Brain natriuretic peptide  Result Value Ref Range   B Natriuretic Peptide 202.0 (H) 0.0 - 100.0 pg/mL  Urinalysis, Routine w reflex microscopic  Result Value Ref Range   Color, Urine AMBER (A) YELLOW   APPearance HAZY (A) CLEAR   Specific Gravity, Urine 1.020 1.005 - 1.030   pH 5.0 5.0 - 8.0   Glucose, UA NEGATIVE NEGATIVE mg/dL   Hgb urine dipstick SMALL (A) NEGATIVE   Bilirubin Urine NEGATIVE NEGATIVE   Ketones, ur NEGATIVE NEGATIVE mg/dL   Protein, ur NEGATIVE NEGATIVE mg/dL   Nitrite NEGATIVE NEGATIVE   Leukocytes, UA TRACE (A) NEGATIVE   RBC / HPF 0-5 0 - 5 RBC/hpf   WBC, UA 6-30 0 - 5 WBC/hpf   Bacteria, UA RARE (A) NONE SEEN   Mucous PRESENT    Hyaline Casts, UA PRESENT    Granular Casts, UA PRESENT    Laboratory interpretation all normal except possible UTI, leukocytosis,  stable hyponatremia, stable low chloride    EKG Interpretation None       Radiology Dg Chest 2 View  Result Date: 11/17/2016 CLINICAL DATA:  Recent positive test for flu, shortness of breath EXAM: CHEST  2 VIEW COMPARISON:  11/10/2016 FINDINGS: Stable cardiomegaly and tiny effusions. No interval consolidation. No pneumothorax. Mild linear atelectasis in the left upper lobe. IMPRESSION: Cardiomegaly with tiny effusions. No interval development of acute focal pulmonary infiltrate Electronically Signed   By: Jasmine Pang M.D.   On: 11/17/2016 01:39    Procedures Procedures (including critical care time)  Medications Ordered in ED Medications  oseltamivir (TAMIFLU) capsule 75 mg (75 mg Oral Given 11/17/16 0330)  apixaban (ELIQUIS) tablet 5 mg (5 mg Oral Given 11/17/16 0330)  cefTRIAXone (ROCEPHIN)  injection 1 g (1 g Intramuscular Given 11/17/16 0330)     Initial Impression / Assessment and Plan / ED Course  I have reviewed the triage vital signs and the nursing notes.  Pertinent labs & imaging results that were available during my care of the patient were reviewed by me and considered in my medical decision making (see chart for details).     DIAGNOSTIC STUDIES:  Oxygen Saturation is 98% on NCO2, normal by my interpretation.    COORDINATION OF CARE:  12:10 AM Discussed treatment plan with pt at bedside and pt agreed to plan. She hasn't had recent lab work, they were ordered.    02:52 AM Dr Sharl Ma, admit to tele, observation  Final Clinical Impressions(s) / ED Diagnoses   Final diagnoses:  Influenza  Urinary tract infection without hematuria, site unspecified    Plan admission  Devoria Albe, MD, FACEP   I personally performed the services described in this documentation, which was scribed in my presence. The recorded information has been reviewed and considered.  Devoria Albe, MD, Concha Pyo, MD 11/17/16 [phone]

## 2016-11-16 NOTE — Progress Notes (Signed)
[facility].

## 2016-11-16 NOTE — Clinical Social Work Placement (Addendum)
   CLINICAL SOCIAL WORK PLACEMENT  NOTE  Date:  11/16/2016  Patient Details  Name: Yvonne Chan MRN: [id number] Date of Birth: [birth year]  Clinical Social Work is seeking post-discharge placement for this patient at the Skilled  Nursing Facility level of care (*CSW will initial, date and re-position this form in  chart as items are completed):  Yes   Patient/family provided with [facility] Clinical Social Work Department's list of facilities offering this level of care within the geographic area requested by the patient (or if unable, by the patient's family).  Yes   Patient/family informed of their freedom to choose among providers that offer the needed level of care, that participate in Medicare, Medicaid or managed care program needed by the patient, have an available bed and are willing to accept the patient.      Patient/family informed of [facility]'s ownership interest in [facility] and [facility], as well as of the fact that they are under no obligation to receive care at these facilities.  PASRR submitted to EDS on 11/08/16     PASRR number received on 11/08/16     Existing PASRR number confirmed on       FL2 transmitted to all facilities in geographic area requested by pt/family on 11/08/16     FL2 transmitted to all facilities within larger geographic area on       Patient informed that his/her managed care company has contracts with or will negotiate with certain facilities, including the following:        Yes   Patient/family informed of bed offers received.  [facility]     Physician recommends and patient chooses bed at      Patient [facility] on 11[facility]16/16.  Patient to be transferred to facility by Staff     Patient family notified on 11/16/16 of transfer.  Name of family member notified:  [place]- daughter     PHYSICIAN       Additional Comment:  Per [facility], authorization received.    _______________________________________________ Karn CassisStultz, [place], LCSW 11/16/2016, 2:38 PM [phone]

## 2016-11-16 NOTE — Progress Notes (Signed)
Subjective: Interval History: Patient offers no complaints. She denies any difficulty in breathing.  Objective: Vital signs in last 24 hours: Temp:  [98 F (36.7 C)-98.4 F (36.9 C)] 98.3 F (36.8 C) (02/09 0534) Pulse Rate:  [97-114] 114 (02/09 0534) Resp:  [17-19] 18 (02/09 0534) BP: (126-146)/(86-108) 146/108 (02/09 0534) SpO2:  [91 %-94 %] 94 % (02/09 0534) Weight change:   Intake/Output from previous day: 02/08 0701 - 02/09 0700 In: 500 [P.O.:500] Out: 900 [Urine:900] Intake/Output this shift: No intake/output data recorded.  General appearance: alert, cooperative and no distress Resp: clear to auscultation bilaterally Cardio: regular rate and rhythm Extremities: No edema  Lab Results: No results for input(s): WBC, HGB, HCT, PLT in the last 72 hours. BMET:   Recent Labs  11/15/16 0437 11/16/16 0556  NA 129* 129*  K 4.2 4.2  CL 88* 88*  CO2 32 35*  GLUCOSE 103* 126*  BUN 12 13  CREATININE 0.66 0.63  CALCIUM 7.8* 8.2*   No results for input(s): PTH in the last 72 hours. Iron Studies: No results for input(s): IRON, TIBC, TRANSFERRIN, FERRITIN in the last 72 hours.  Studies/Results: No results found.  I have reviewed the patient's current medications.  Assessment/Plan: Problem #1 hyponatremia: Most likely hypovolemic hyponatremia. Presently she is on free water restriction. Her sodium is 129 Is stable. Problem #2 hypokalemia: Patient off potassium supplement but her potassium remains normal. Problem #3 hypertension: Blood pressure is reasonably controlled Problem #4 history of hypothyroidism: She is on Synthroid Problem #5 history of atrial fibrillation: Her heart rate is controlled Problem #6 history of diastolic dysfunction: Presently patient is asymptomatic. Plan: 1] will continue with free water restriction 2] We'll check her renal panel in the morning.    LOS: 9 days   Kathalene Sporer S 11/16/2016,8:22 AM

## 2016-11-16 NOTE — Progress Notes (Signed)
This is an acute visit.  Level care skilled.  Facility is [facility].  Chief complaint-acute visit status post hospitalization for hyponatremia-complicated with congestive heart failure-  History of present illness.  Patient is an 83 year old female with complicated medical history including atrial fibrillation-hypertension-hypothyroidism.  She was recently admitted to the hospital in late January for atrial fibrillation with rapid ventricular rate and acute diastolic CHF.  She was treated with Lasix and discharged home.  She returned to the hospital with overall weakness and shortness of breath and inability to walk.  Her Lasix was restarted she was placed on fluid restrictions because her sodium was 119.  Nephrology was consulted and her sodium slowly increased.  On discharge it was 129.  She was evaluated by therapy and thought appropriate for skilled nursing discharge.  On admissionpatient did appear to have a cough and some chest congestion did not appear to be in any distress but quite weak.  We have ordered a chest x-ray--- and will also order an expectorant and nebulizers as needed.  Her O2 saturation was in the high 80s to 90 on admission.  Per her family and the room she had been on oxygen on and off in the hospital I suspect she will need this when necessary as well.  It appears on hospital discharge her blood pressure was somewhat elevated listed as 146/108-we will did recheck it manually this afternoon and got 118/70.  Pulse was in the 80s-it appears the hospital at times she does have pulses above 100.  Past Medical History:  Diagnosis Date  . Atrial fibrillation (HCC)   . Essential hypertension   . Hypothyroidism   . Lumbago   . Neuralgia   . Neuritis          Past Surgical History:  Procedure Laterality Date  . ABDOMINAL HYSTERECTOMY    . APPENDECTOMY      Social History        Social History  . Marital status: Widowed      Spouse name: N/A  . Number of children: N/A  . Years of education: N/A       Occupational History  . retired        Social History Main Topics  . Smoking status: Never Smoker  . Smokeless tobacco: Never Used  . Alcohol use No  . Drug use: No  . Sexual activity: Not on file       Other Topics Concern  . Not on file      Social History Narrative  . No narrative on file    No Known Allergies   Medication List    TAKE these medications   amLODipine 5 MG tablet Commonly known as:  NORVASC Take 1 tablet (5 mg total) by mouth daily. Start taking on:  11/17/2016   apixaban 5 MG Tabs tablet Commonly known as:  ELIQUIS Take 1 tablet (5 mg total) by mouth 2 (two) times daily.   Fish Oil 1000 MG Caps Take 1 capsule by mouth daily.   furosemide 40 MG tablet Commonly known as:  LASIX Take 1 tablet (40 mg total) by mouth daily.   levothyroxine 50 MCG tablet Commonly known as:  SYNTHROID, LEVOTHROID Take 50 mcg by mouth daily before breakfast.   methyldopa 250 MG tablet Commonly known as:  ALDOMET Take 250 mg by mouth daily.   metoprolol tartrate 25 MG tablet Commonly known as:  LOPRESSOR Take 1 tablet (25 mg total) by mouth 2 (two) times daily.  potassium chloride 10 MEQ tablet Commonly known as:  K-DUR Take 10 mEq by mouth daily.   sodium chloride 1 g tablet Take 1 tablet (1 g total)     Review of systems.  In general she says she feels better in that she did when she was in the hospital but has significant weakness.  She does not complain of fever or chills.  Skin does not complain of rashes or itching.  Head ears eyes nose mouth and throat denies sore throat does not complain of visual changes.  Respiratory has a cough and chest congestion but does not really complain of overt shortness of breath.  Cardiac does not complaining of chest pain has what appears to be chronic lower extremity edema per family this has improved.  GI  is not complaining of nausea or vomiting-family is not had a bowel movement in several days but does not complaining of abdominal pain.  GU is not complaining of dysuria.  Musculoskeletal has significant weakness but is not really complaining of joint pain.  Neurologic is not complaining of dizziness headache or syncope.  Psych does not really complain of depression or anxiety   Physical exam.  Temperature is 98.5 pulse 82 respirations 22 blood pressure 118/70 done manually O2 saturation 88% on room air.  In general this is a somewhat frail appearing elderly female in no acute distress initially appears slightly uncomfortable but on reevaluation appeared to be more comfortable.  Her skin is warm and dry.  Eyes pupils appear reactive to light visual acuity appears grossly intact.  Oropharynx is clear mucous membranes moist.  Chest she does have some some scattered coarse breath sounds diffuse-there is no labored breathing.  Heart is somewhat distant heart sounds was irregular irregular she appears to have some edema per family this has improved during her hospital stay--would estimate 1+ edema apparently at one time in hospital was 2+  Abdomen is obese soft nontender with slightly hypoactive bowel sounds.  Muscle skeletal does move all extremities 4 but has some lower extremity weakness.  Neurologic could not really appreciate a lateralizing findings cranial nerves are intact her speech is clear but she is not speaking much.  Psych she appears alert and oriented pleasant and appropriate. Somewhat weak however not speaking much  Labs.   . 9 2018.  Sodium 129 potassium 4.2 BUN 13 creatinine 0.63 CO2 level XXXV.  Albumin 2.4-otherwise liver function tests within normal limits  11/15/2016.  Magnesium level was 1.8.  11/10/2016.  WBC 7.1 hemoglobin 13.3 platelets 335   Assessment and plan.  #1 cough with chest congestion-per discussion with family apparently she had  been on oxygen supplementation often in the hospital will start a when necessary oxygen ordered to keep stats greater than equal to 90%-she does not appear to be in any distress.  However she does have some cough and congestion will start Mucinex 600 mg twice a day for 5 days-also will add Atrovent nebulizers every 6 hours when necessary shortness of breath or wheezing.  Also a flu culture swab has been obtained as well   .  Also will obtain a two-view chest x-ray I note last x-ray in the hospital on February 3 showed cardiomegaly with mild pulmonary venous congestion probable tiny effusions.  #2 history of atrial fibrillation--at this point appears rate controlled apparently had been in issue at one point during her earlier hospitalization she is on Lopressor 25 mg twice a day she is on Eliquis for anticoagulation.  #3  history of diastolic CHF-she is on Lasix 40 mg a day with potassium supplementation 10 mEq a day-she will need daily weights notify provider of gain greater than 3 pounds-also will update a metabolic panel tomorrow to keep an eye on her electrolytes.  Per family her edema appears to be improved but this will have to be watched.  #4 hyponatremia this was an issue in the hospital has gradually risen from 123 up to 129 today-she is on a sodium chloride tablet 2 times a day-again will update this tomorrow-.  #5 history of hypothyroidism she is on Synthroid--TSH on 11/08/2016 was in  normal limits at 0.878  #6 history of hypertension she is on Norvasc 5 milligrams a day as well as methyldopa 250 mg daily-I see an order to start the Norvasc tomorrow morning which may account for elevated blood pressure earlier today however this appears to have stabilized as noted above at this point will continue to monitor  #7-possible constipation-will obtain an abdominal x-ray as well when chest x-rays obtained if non-concerning will start a motility agent  Number 8I do see a noted history of  hyperglycemia will order CBGs daily to keep an eye on this--appears blood sugars in the hospital that I see were 126-118 this range   Patient will need close monitoring here she does appear to be more comfortable no sign of distress but will have to be watched closely  [id number]-of note greater than 60 minutes spent assessing patient-reviewing her chart-reviewing her labs discussing her her status with nursing as well as with her family at bedsideandn coordinating and formulating a plan of care-of note greater than 50% of time spent coordinating plan of care   Addendum-nursing has obtained verbal results of the flu swab and it is positive for influenza-I did recheck patient she does not appear to be unstable but is slightly tachycardic and flushed-and does not appear to be feeling well-I'm concerned that with the flu her respiratory status is at risk for deteriorating fairly significantly-will send her to the ER for evaluation and possible hospitalization.

## 2016-11-16 NOTE — Care Management Important Message (Signed)
Important Message  Patient Details  Name: Yvonne BellingMary Chan MRN: [id number] Date of Birth: 29-Dec-1928   Medicare Important Message Given:  Yes    Malcolm MetroChildress, Jeanny Rymer Demske, RN 11/16/2016, 1:41 PM

## 2016-11-16 NOTE — Discharge Summary (Signed)
Physician Discharge Summary  Yvonne Chan BJY:[id number] DOB: 1928/12/31 DOA: 11/07/2016  PCP: Abran Richard, PA-C  Admit date: 11/07/2016 Discharge date: 11/16/2016  Admitted From: Home Disposition:  SNF- [facility]  Recommendations for Outpatient Follow-up:  1. Follow up with PCP in 1-2 weeks 2. See MD at first visit 3. Monitor urine output 4. BMP in 2 days 5. Fluid restriction  Home Health: NO Equipment/Devices: None  Discharge Condition: Stable CODE STATUS:Full Code Diet recommendation: Heart Healthy   Brief/Interim Summary: 83 y/o ? hypertension, hypothyroidism and recent admission 1/25-1/30/18 for A. fib with RVR acute diastolic failure--on that admission found to be 6 L negative continued on oral Lasix and discharged home. Returned to the hospital with overall weakness some shortness of breath and inability to ambulate. Restarted on Lasix and placed on fluid restriction as found to be hyponatremic to 119 range.  Nephrology was consulted and patient sodium slowly increased over admission.  She was evaluated by PT and found to be appropriate for a SNF.  She was stable for discharge on 11/16/16.  SNF was given instructions for patient to get BMP drawn in 2 days as well as to monitor urine output.  Discharge Diagnoses:  Principal Problem:   Hyponatremia Active Problems:   Atrial fibrillation with RVR (HCC)   HTN (hypertension)   Hypothyroidism   Acute diastolic CHF (congestive heart failure) (HCC)   Hyperglycemia   Weakness    Discharge Instructions  Discharge Instructions    Call MD for:  difficulty breathing, headache or visual disturbances    Complete by:  As directed    Call MD for:  extreme fatigue    Complete by:  As directed    Call MD for:  hives    Complete by:  As directed    Call MD for:  persistant dizziness or light-headedness    Complete by:  As directed    Call MD for:  persistant nausea and vomiting    Complete by:  As directed    Call MD for:  severe  uncontrolled pain    Complete by:  As directed    Call MD for:  temperature >100.4    Complete by:  As directed    Diet general    Complete by:  As directed    Dysphagia 2 diet   Increase activity slowly    Complete by:  As directed      Allergies as of 11/16/2016   No Known Allergies     Medication List    TAKE these medications   amLODipine 5 MG tablet Commonly known as:  NORVASC Take 1 tablet (5 mg total) by mouth daily. Start taking on:  11/17/2016   apixaban 5 MG Tabs tablet Commonly known as:  ELIQUIS Take 1 tablet (5 mg total) by mouth 2 (two) times daily.   Fish Oil 1000 MG Caps Take 1 capsule by mouth daily.   furosemide 40 MG tablet Commonly known as:  LASIX Take 1 tablet (40 mg total) by mouth daily.   levothyroxine 50 MCG tablet Commonly known as:  SYNTHROID, LEVOTHROID Take 50 mcg by mouth daily before breakfast.   methyldopa 250 MG tablet Commonly known as:  ALDOMET Take 250 mg by mouth daily.   metoprolol tartrate 25 MG tablet Commonly known as:  LOPRESSOR Take 1 tablet (25 mg total) by mouth 2 (two) times daily.   potassium chloride 10 MEQ tablet Commonly known as:  K-DUR Take 10 mEq by mouth daily.  sodium chloride 1 g tablet Take 1 tablet (1 g total) by mouth 2 (two) times daily with a meal.      Contact information for after-discharge care    Destination    [facility] .   Specialty:  Skilled Nursing Facility Contact information: [address] [place] [address] [phone]             No Known Allergies  Consultations:  Nephrology  PT  SW   Procedures/Studies: Dg Chest 2 View  Result Date: 11/10/2016 CLINICAL DATA:  Heart failure.  Shortness of breath. EXAM: CHEST  2 VIEW COMPARISON:  November 07, 2014 FINDINGS: Cardiomegaly with mild pulmonary venous congestion. Probable tiny effusions. No significant interval change. IMPRESSION: Cardiomegaly with mild pulmonary venous congestion  and tiny effusions. Electronically Signed   By: Gerome Sam III M.D   On: 11/10/2016 12:47   Dg Chest 2 View  Result Date: 11/07/2016 CLINICAL DATA:  Short of breath and chest pain EXAM: CHEST  2 VIEW COMPARISON:  11/01/2016 FINDINGS: Cardiac enlargement with mild vascular congestion. Negative for edema or effusion. Mild left lower lobe atelectasis. Thoracic degenerative changes. IMPRESSION: Cardiac enlargement with mild vascular congestion. Mild left lower lobe atelectasis Electronically Signed   By: Marlan Palau M.D.   On: 11/07/2016 15:31   Dg Chest Portable 1 View  Result Date: 11/01/2016 CLINICAL DATA:  Generalized weakness. New onset atrial fibrillation. EXAM: PORTABLE CHEST 1 VIEW COMPARISON:  None. FINDINGS: Study is limited by the patient's size and portable technique. There is cardiomegaly and pulmonary vascular congestion. Mild left basilar atelectasis is noted. No pneumothorax or pleural effusion. IMPRESSION: Cardiomegaly and pulmonary vascular congestion. Electronically Signed   By: Drusilla Kanner M.D.   On: 11/01/2016 14:14       Subjective: Patient seen and examined.  She voices that she feels fine today.  No events noted overnight.  Discharge Exam: Vitals:   11/16/16 0534 11/16/16 1342  BP: (!) 146/108 134/86  Pulse: (!) 114 (!) 108  Resp: 18 18  Temp: 98.3 F (36.8 C) 98.6 F (37 C)   Vitals:   11/15/16 1520 11/15/16 2235 11/16/16 0534 11/16/16 1342  BP: 126/87 137/86 (!) 146/108 134/86  Pulse: 97 (!) 109 (!) 114 (!) 108  Resp: 17 19 18 18   Temp: 98 F (36.7 C) 98.4 F (36.9 C) 98.3 F (36.8 C) 98.6 F (37 C)  TempSrc: Oral Oral Oral Oral  SpO2: 91% 93% 94% 96%  Weight:      Height:        General: Pt is alert, awake, not in acute distress Cardiovascular: RRR, S1/S2 +, no rubs, no gallops Respiratory: CTA bilaterally, no wheezing, no rhonchi Abdominal: Soft, NT, ND, bowel sounds + Extremities: no edema, no cyanosis    The results of  significant diagnostics from this hospitalization (including imaging, microbiology, ancillary and laboratory) are listed below for reference.     Microbiology: Recent Results (from the past 240 hour(s))  Urine culture     Status: None   Collection Time: 11/07/16  3:30 PM  Result Value Ref Range Status   Specimen Description URINE, CATHETERIZED  Final   Special Requests NONE  Final   Culture   Final    NO GROWTH Performed at [facility], [address] [place], [place] [address]    Report Status 11/09/2016 FINAL  Final     Labs: BNP (last 3 results)  Recent Labs  11/07/16 1525  BNP 630.0*   Basic Metabolic Panel:  Recent Labs Lab 11/11/16 0652 11/12/16 0546 11/13/16 0550 11/14/16 0459 11/15/16 0437 11/16/16 0556  NA 125* 123* 123* 126* 129* 129*  K 3.6 3.7 3.5 3.9 4.2 4.2  CL 78* 77* 78* 82* 88* 88*  CO2 36* 36* 35* 30 32 35*  GLUCOSE 111* 121* 131* 115* 103* 126*  BUN 12 12 13 12 12 13   CREATININE 0.57 0.63 0.56 0.56 0.66 0.63  CALCIUM 7.9* 7.6* 7.8* 8.0* 7.8* 8.2*  MG 1.2*  --  1.3*  --  1.8  --   PHOS  --   --   --  2.6  --  3.4   Liver Function Tests:  Recent Labs Lab 11/10/16 0646 11/12/16 0546 11/14/16 0459 11/16/16 0556  AST 55* 42*  --   --   ALT 28 24  --   --   ALKPHOS 68 54  --   --   BILITOT 0.7 1.0  --   --   PROT 6.4* 6.2*  --   --   ALBUMIN 2.4* 2.4* 2.4* 2.4*   No results for input(s): LIPASE, AMYLASE in the last 168 hours. No results for input(s): AMMONIA in the last 168 hours. CBC:  Recent Labs Lab 11/10/16 0646  WBC 7.1  NEUTROABS 5.3  HGB 13.3  HCT 38.7  MCV 88.2  PLT 335   Cardiac Enzymes: No results for input(s): CKTOTAL, CKMB, CKMBINDEX, TROPONINI in the last 168 hours. BNP: Invalid input(s): POCBNP CBG:  Recent Labs Lab 11/12/16 0802 11/12/16 1229  GLUCAP 126* 147*   D-Dimer No results for input(s): DDIMER in the last 72 hours. Hgb A1c No results for input(s): HGBA1C in the last 72 hours. Lipid  Profile No results for input(s): CHOL, HDL, LDLCALC, TRIG, CHOLHDL, LDLDIRECT in the last 72 hours. Thyroid function studies No results for input(s): TSH, T4TOTAL, T3FREE, THYROIDAB in the last 72 hours.  Invalid input(s): FREET3 Anemia work up No results for input(s): VITAMINB12, FOLATE, FERRITIN, TIBC, IRON, RETICCTPCT in the last 72 hours. Urinalysis    Component Value Date/Time   COLORURINE YELLOW 11/07/2016 1530   APPEARANCEUR CLEAR 11/07/2016 1530   LABSPEC 1.013 11/07/2016 1530   PHURINE 7.0 11/07/2016 1530   GLUCOSEU NEGATIVE 11/07/2016 1530   HGBUR NEGATIVE 11/07/2016 1530   BILIRUBINUR NEGATIVE 11/07/2016 1530   KETONESUR NEGATIVE 11/07/2016 1530   PROTEINUR NEGATIVE 11/07/2016 1530   NITRITE NEGATIVE 11/07/2016 1530   LEUKOCYTESUR NEGATIVE 11/07/2016 1530   Sepsis Labs Invalid input(s): PROCALCITONIN,  WBC,  LACTICIDVEN Microbiology Recent Results (from the past 240 hour(s))  Urine culture     Status: None   Collection Time: 11/07/16  3:30 PM  Result Value Ref Range Status   Specimen Description URINE, CATHETERIZED  Final   Special Requests NONE  Final   Culture   Final    NO GROWTH Performed at [facility], [address] [place], [place] [address]    Report Status 11/09/2016 FINAL  Final     Time coordinating discharge: Over 30 minutes  SIGNED:   Katrinka BlazingAlex U Kadolph, MD  [facility] 11/16/2016, 3:30 PM Pager [phone] If 7PM-7AM, please contact night-coverage [email or web address] Password [email or web address]

## 2016-11-16 NOTE — Progress Notes (Signed)
Iv removed, WNL. D/C instructions given to pt. RN and NT to transport to [facility] shortly.

## 2016-11-16 NOTE — ED Triage Notes (Signed)
Sent [facility] in[facility] to the [facility], per staff pt tested positive for flu at 1600 and they refuse to take her due to her testing positive for flu

## 2016-11-16 NOTE — Progress Notes (Signed)
Pt has had a nonproductive congested cough tonight. O2 sat this morning was 88%. Placed pt on 2L O2 via [place] and O2 sats at 94%. Will continue to monitor.

## 2016-11-17 ENCOUNTER — Emergency Department (HOSPITAL_COMMUNITY): Payer: Medicare HMO

## 2016-11-17 DIAGNOSIS — J111 Influenza due to unidentified influenza virus with other respiratory manifestations: Secondary | ICD-10-CM

## 2016-11-17 DIAGNOSIS — E038 Other specified hypothyroidism: Secondary | ICD-10-CM | POA: Diagnosis not present

## 2016-11-17 DIAGNOSIS — I10 Essential (primary) hypertension: Secondary | ICD-10-CM | POA: Diagnosis not present

## 2016-11-17 LAB — URINALYSIS, ROUTINE W REFLEX MICROSCOPIC
Bilirubin Urine: NEGATIVE
GLUCOSE, UA: NEGATIVE mg/dL
Ketones, ur: NEGATIVE mg/dL
NITRITE: NEGATIVE
Protein, ur: NEGATIVE mg/dL
Specific Gravity, Urine: 1.02 (ref 1.005–1.030)
pH: 5 (ref 5.0–8.0)

## 2016-11-17 LAB — CBC WITH DIFFERENTIAL/PLATELET
BASOS ABS: 0 10*3/uL (ref 0.0–0.1)
BASOS PCT: 0 %
EOS ABS: 0.2 10*3/uL (ref 0.0–0.7)
Eosinophils Relative: 2 %
HEMATOCRIT: 42.7 % (ref 36.0–46.0)
HEMOGLOBIN: 14 g/dL (ref 12.0–15.0)
Lymphocytes Relative: 11 %
Lymphs Abs: 1.3 10*3/uL (ref 0.7–4.0)
MCH: 30.2 pg (ref 26.0–34.0)
MCHC: 32.8 g/dL (ref 30.0–36.0)
MCV: 92 fL (ref 78.0–100.0)
Monocytes Absolute: 0.7 10*3/uL (ref 0.1–1.0)
Monocytes Relative: 7 %
NEUTROS ABS: 9.1 10*3/uL — AB (ref 1.7–7.7)
Neutrophils Relative %: 80 %
Platelets: 389 10*3/uL (ref 150–400)
RBC: 4.64 MIL/uL (ref 3.87–5.11)
RDW: 13.1 % (ref 11.5–15.5)
WBC: 11.3 10*3/uL — AB (ref 4.0–10.5)

## 2016-11-17 LAB — COMPREHENSIVE METABOLIC PANEL
ALK PHOS: 53 U/L (ref 38–126)
ALT: 20 U/L (ref 14–54)
ANION GAP: 8 (ref 5–15)
AST: 25 U/L (ref 15–41)
Albumin: 2.5 g/dL — ABNORMAL LOW (ref 3.5–5.0)
BUN: 16 mg/dL (ref 6–20)
CALCIUM: 8.6 mg/dL — AB (ref 8.9–10.3)
CO2: 33 mmol/L — AB (ref 22–32)
CREATININE: 0.7 mg/dL (ref 0.44–1.00)
Chloride: 88 mmol/L — ABNORMAL LOW (ref 101–111)
Glucose, Bld: 106 mg/dL — ABNORMAL HIGH (ref 65–99)
Potassium: 4.4 mmol/L (ref 3.5–5.1)
SODIUM: 129 mmol/L — AB (ref 135–145)
TOTAL PROTEIN: 6.8 g/dL (ref 6.5–8.1)
Total Bilirubin: 0.9 mg/dL (ref 0.3–1.2)

## 2016-11-17 LAB — MRSA PCR SCREENING: MRSA by PCR: NEGATIVE

## 2016-11-17 LAB — BRAIN NATRIURETIC PEPTIDE: B NATRIURETIC PEPTIDE 5: 202 pg/mL — AB (ref 0.0–100.0)

## 2016-11-17 MED ORDER — POTASSIUM CHLORIDE CRYS ER 10 MEQ PO TBCR
10.0000 meq | EXTENDED_RELEASE_TABLET | Freq: Every day | ORAL | Status: DC
  Administered 2016-11-17 – 2016-11-18 (×2): 10 meq via ORAL
  Filled 2016-11-17 (×5): qty 1

## 2016-11-17 MED ORDER — LEVOTHYROXINE SODIUM 50 MCG PO TABS
50.0000 ug | ORAL_TABLET | Freq: Every day | ORAL | Status: DC
  Administered 2016-11-17 – 2016-11-18 (×2): 50 ug via ORAL
  Filled 2016-11-17 (×2): qty 1

## 2016-11-17 MED ORDER — IPRATROPIUM BROMIDE 0.02 % IN SOLN: 0.5000 mg | Freq: Four times a day (QID) | RESPIRATORY_TRACT | Status: DC | PRN

## 2016-11-17 MED ORDER — SODIUM CHLORIDE 0.9% FLUSH: 3.0000 mL | INTRAVENOUS | Status: DC | PRN

## 2016-11-17 MED ORDER — OSELTAMIVIR PHOSPHATE 75 MG PO CAPS: 75.0000 mg | ORAL_CAPSULE | Freq: Two times a day (BID) | ORAL | 0 refills | Status: DC

## 2016-11-17 MED ORDER — OSELTAMIVIR PHOSPHATE 75 MG PO CAPS
75.0000 mg | ORAL_CAPSULE | Freq: Once | ORAL | Status: AC
Start: 2016-11-17 — End: 2016-11-17
  Administered 2016-11-17: 75 mg via ORAL
  Filled 2016-11-17: qty 1

## 2016-11-17 MED ORDER — APIXABAN 5 MG PO TABS
ORAL_TABLET | ORAL | Status: AC
  Filled 2016-11-17: qty 1

## 2016-11-17 MED ORDER — APIXABAN 5 MG PO TABS
5.0000 mg | ORAL_TABLET | Freq: Two times a day (BID) | ORAL | Status: DC
Start: 2016-11-17 — End: 2016-11-18
  Administered 2016-11-17 – 2016-11-18 (×3): 5 mg via ORAL
  Filled 2016-11-17 (×3): qty 1

## 2016-11-17 MED ORDER — ONDANSETRON HCL 4 MG/2ML IJ SOLN: 4.0000 mg | Freq: Four times a day (QID) | INTRAMUSCULAR | Status: DC | PRN

## 2016-11-17 MED ORDER — METOPROLOL TARTRATE 25 MG PO TABS
25.0000 mg | ORAL_TABLET | Freq: Two times a day (BID) | ORAL | Status: DC
Start: 2016-11-17 — End: 2016-11-18
  Administered 2016-11-17 – 2016-11-18 (×3): 25 mg via ORAL
  Filled 2016-11-17 (×3): qty 1

## 2016-11-17 MED ORDER — IPRATROPIUM-ALBUTEROL 0.5-2.5 (3) MG/3ML IN SOLN: 3.0000 mL | Freq: Four times a day (QID) | RESPIRATORY_TRACT | Status: DC | PRN

## 2016-11-17 MED ORDER — DEXTROSE 5 % IV SOLN
1.0000 g | INTRAVENOUS | Status: DC
  Administered 2016-11-18: 1 g via INTRAVENOUS
  Filled 2016-11-17 (×2): qty 10

## 2016-11-17 MED ORDER — GUAIFENESIN ER 600 MG PO TB12
600.0000 mg | ORAL_TABLET | Freq: Two times a day (BID) | ORAL | Status: DC
  Administered 2016-11-17 – 2016-11-18 (×3): 600 mg via ORAL
  Filled 2016-11-17 (×3): qty 1

## 2016-11-17 MED ORDER — OSELTAMIVIR PHOSPHATE 75 MG PO CAPS
75.0000 mg | ORAL_CAPSULE | Freq: Two times a day (BID) | ORAL | Status: DC
Start: 2016-11-17 — End: 2016-11-18
  Administered 2016-11-17 – 2016-11-18 (×3): 75 mg via ORAL
  Filled 2016-11-17 (×3): qty 1

## 2016-11-17 MED ORDER — SODIUM CHLORIDE 0.9% FLUSH
3.0000 mL | Freq: Two times a day (BID) | INTRAVENOUS | Status: DC
  Administered 2016-11-17 (×2): 3 mL via INTRAVENOUS

## 2016-11-17 MED ORDER — ONDANSETRON HCL 4 MG PO TABS: 4.0000 mg | ORAL_TABLET | Freq: Four times a day (QID) | ORAL | Status: DC | PRN

## 2016-11-17 MED ORDER — FUROSEMIDE 40 MG PO TABS
40.0000 mg | ORAL_TABLET | Freq: Every day | ORAL | Status: DC
  Administered 2016-11-17 – 2016-11-18 (×2): 40 mg via ORAL
  Filled 2016-11-17 (×2): qty 1

## 2016-11-17 MED ORDER — DEXTROSE 5 % IV SOLN: 1.0000 g | Freq: Once | INTRAVENOUS | Status: DC

## 2016-11-17 MED ORDER — METHYLDOPA 250 MG PO TABS
250.0000 mg | ORAL_TABLET | Freq: Every day | ORAL | Status: DC
  Administered 2016-11-17 – 2016-11-18 (×2): 250 mg via ORAL
  Filled 2016-11-17 (×3): qty 1

## 2016-11-17 MED ORDER — SODIUM CHLORIDE 0.9 % IV SOLN
250.0000 mL | INTRAVENOUS | Status: DC | PRN
  Administered 2016-11-17: 250 mL via INTRAVENOUS

## 2016-11-17 MED ORDER — APIXABAN 5 MG PO TABS
5.0000 mg | ORAL_TABLET | Freq: Once | ORAL | Status: AC
  Administered 2016-11-17: 5 mg via ORAL
  Filled 2016-11-17: qty 1

## 2016-11-17 MED ORDER — AMLODIPINE BESYLATE 5 MG PO TABS
5.0000 mg | ORAL_TABLET | Freq: Every day | ORAL | Status: DC
  Administered 2016-11-17 – 2016-11-18 (×2): 5 mg via ORAL
  Filled 2016-11-17 (×2): qty 1

## 2016-11-17 MED ORDER — CEFTRIAXONE SODIUM 1 G IJ SOLR
1.0000 g | Freq: Once | INTRAMUSCULAR | Status: AC
  Administered 2016-11-17: 1 g via INTRAMUSCULAR
  Filled 2016-11-17: qty 10

## 2016-11-17 NOTE — Progress Notes (Signed)
Sp[facility] with CSW [facility] Paperwork still appropriate for discharge. Nursing [facility] refusing to accept patient back at this time. Dr. Ardyth HarpsHernandez paged and made aware. MD [facility] Nursing supervisor is [facility] and will text page MD with phone number.

## 2016-11-17 NOTE — H&P (Signed)
TRH H&P    Patient Demographics:    Yvonne Chan, is a 83 y.o. female  MRN: [id number]  DOB - [birth year]  Admit Date - 11/16/2016  Referring MD/NP/PA: Dr. Lynelle Doctor  Outpatient Primary MD for the patient is Abran Richard, PA-C  Patient coming from: Skilled facility  Chief Complaint  Patient presents with  . Influenza    [facility] at the [facility] at 4, they will not accept      HPI:    Yvonne Chan  is a 83 y.o. female, With history of atrial fibrillation, hypertension, hypothyroidism, CHF who was discharged yesterday to go to skilled facility. Patient was coughing, and tested positive for influenza, patient was sent to hospital for further evaluation. Patient complains of cough, mild shortness of breath. No chest pain. No nausea vomiting or diarrhea. No dysuria. In the ED she was found to have abnormal UA started on ceftriaxone..     Review of systems:    In addition to the HPI above,  No Fever-chills, No Headache, No changes with Vision or hearing, No problems swallowing food or Liquids, No Blood in stool or Urine, No dysuria, No new skin rashes or bruises, No new joints pains-aches,  No new weakness, tingling, numbness in any extremity, No recent weight gain or loss, No polyuria, polydypsia or polyphagia, No significant Mental Stressors.  A full 10 point Review of Systems was done, except as stated above, all other Review of Systems were negative.   With Past History of the following :    Past Medical History:  Diagnosis Date  . Atrial fibrillation (HCC)   . Essential hypertension   . Hypothyroidism   . Lumbago   . Neuralgia   . Neuritis       Past Surgical History:  Procedure Laterality Date  . ABDOMINAL HYSTERECTOMY    . APPENDECTOMY        Social History:      Social History  Substance Use Topics  . Smoking status: Never Smoker  . Smokeless tobacco: Never Used   . Alcohol use No       Family History :   Noncontributory   Home Medications:   Prior to Admission medications   Medication Sig Start Date End Date Taking? Authorizing Provider  amLODipine (NORVASC) 5 MG tablet Take 1 tablet (5 mg total) by mouth daily. 11/17/16  Yes Filbert Schilder, MD  apixaban (ELIQUIS) 5 MG TABS tablet Take 1 tablet (5 mg total) by mouth 2 (two) times daily. 11/06/16  Yes Estela Isaiah Blakes, MD  furosemide (LASIX) 40 MG tablet Take 1 tablet (40 mg total) by mouth daily. 11/07/16  Yes Estela Isaiah Blakes, MD  guaiFENesin (MUCINEX) 600 MG 12 hr tablet Take by mouth 2 (two) times daily.   Yes Historical Provider, MD  ipratropium (ATROVENT) 0.02 % nebulizer solution Take 0.5 mg by nebulization every 6 (six) hours as needed for wheezing or shortness of breath.   Yes Historical Provider, MD  levothyroxine (SYNTHROID, LEVOTHROID) 50 MCG tablet Take 50 mcg  by mouth daily before breakfast.   Yes Historical Provider, MD  methyldopa (ALDOMET) 250 MG tablet Take 250 mg by mouth daily.   Yes Historical Provider, MD  metoprolol tartrate (LOPRESSOR) 25 MG tablet Take 1 tablet (25 mg total) by mouth 2 (two) times daily. 11/06/16  Yes Estela Isaiah Blakes, MD  Omega-3 Fatty Acids (FISH OIL) 1000 MG CAPS Take 1 capsule by mouth daily.   Yes Historical Provider, MD  OXYGEN Inhale 2 L into the lungs daily as needed. To keep sats greater or equal to 90%   Yes Historical Provider, MD  potassium chloride (K-DUR) 10 MEQ tablet Take 10 mEq by mouth daily.   Yes Historical Provider, MD  sodium chloride 1 g tablet Take 1 tablet (1 g total) by mouth 2 (two) times daily with a meal. 11/16/16  Yes Filbert Schilder, MD     Allergies:    No Known Allergies   Physical Exam:   Vitals  Blood pressure (!) 128/109, pulse 108, temperature 97.7 F (36.5 C), temperature source Oral, resp. rate 20, height 5\' 6"  (1.676 m), weight 103 kg (227 lb), SpO2 96 %.  1.   General: Appears lethargic  2. Psychiatric:  Somnolent but arousable, Oriented 3  3. Neurologic: No focal neurological deficits, all cranial nerves intact.Strength 5/5 all 4 extremities, sensation intact all 4 extremities, plantars down going.  4. Eyes :  anicteric sclerae, moist conjunctivae with no lid lag. PERRLA.  5. ENMT:  Oropharynx clear with moist mucous membranes and good dentition  6. Neck:  supple, no cervical lymphadenopathy appriciated, No thyromegaly  7. Respiratory : Normal respiratory effort, good air movement bilaterally, scattered wheezing  8. Cardiovascular : RRR, no gallops, rubs or murmurs, trace leg edema  9. Gastrointestinal:  Positive bowel sounds, abdomen soft, non-tender to palpation,no hepatosplenomegaly, no rigidity or guarding       10. Skin:  No cyanosis, normal texture and turgor, no rash, lesions or ulcers  11.Musculoskeletal:  Good muscle tone,  joints appear normal , no effusions,  normal range of motion    Data Review:    CBC  Recent Labs Lab 11/10/16 0646 11/17/16 0056  WBC 7.1 11.3*  HGB 13.3 14.0  HCT 38.7 42.7  PLT 335 389  MCV 88.2 92.0  MCH 30.3 30.2  MCHC 34.4 32.8  RDW 12.4 13.1  LYMPHSABS 1.1 1.3  MONOABS 0.7 0.7  EOSABS 0.0 0.2  BASOSABS 0.0 0.0   ------------------------------------------------------------------------------------------------------------------  Chemistries   Recent Labs Lab 11/10/16 0646 11/11/16 1610 11/12/16 0546 11/13/16 0550 11/14/16 0459 11/15/16 0437 11/16/16 0556 11/17/16 0056  NA 122* 125* 123* 123* 126* 129* 129* 129*  K 3.3* 3.6 3.7 3.5 3.9 4.2 4.2 4.4  CL 78* 78* 77* 78* 82* 88* 88* 88*  CO2 34* 36* 36* 35* 30 32 35* 33*  GLUCOSE 116* 111* 121* 131* 115* 103* 126* 106*  BUN 12 12 12 13 12 12 13 16   CREATININE 0.52 0.57 0.63 0.56 0.56 0.66 0.63 0.70  CALCIUM 7.8* 7.9* 7.6* 7.8* 8.0* 7.8* 8.2* 8.6*  MG  --  1.2*  --  1.3*  --  1.8  --   --   AST 55*  --  42*  --   --    --   --  25  ALT 28  --  24  --   --   --   --  20  ALKPHOS 68  --  54  --   --   --   --  53  BILITOT 0.7  --  1.0  --   --   --   --  0.9   ------------------------------------------------------------------------------------------------------------------  ------------------------------------------------------------------------------------------------------------------ GFR: Estimated Creatinine Clearance: 62.3 mL/min (by C-G formula based on SCr of 0.7 mg/dL). Liver Function Tests:  Recent Labs Lab 11/10/16 0646 11/12/16 0546 11/14/16 0459 11/16/16 0556 11/17/16 0056  AST 55* 42*  --   --  25  ALT 28 24  --   --  20  ALKPHOS 68 54  --   --  53  BILITOT 0.7 1.0  --   --  0.9  PROT 6.4* 6.2*  --   --  6.8  ALBUMIN 2.4* 2.4* 2.4* 2.4* 2.5*     Recent Labs Lab 11/12/16 0802 11/12/16 1229  GLUCAP 126* 147*   Lipid Profile: No results for input(s): CHOL, HDL, LDLCALC, TRIG, CHOLHDL, LDLDIRECT in the last 72 hours. Thyroid Function Tests: No results for input(s): TSH, T4TOTAL, FREET4, T3FREE, THYROIDAB in the last 72 hours. Anemia Panel: No results for input(s): VITAMINB12, FOLATE, FERRITIN, TIBC, IRON, RETICCTPCT in the last 72 hours.  --------------------------------------------------------------------------------------------------------------- Urine analysis:    Component Value Date/Time   COLORURINE AMBER (A) 11/17/2016 0023   APPEARANCEUR HAZY (A) 11/17/2016 0023   LABSPEC 1.020 11/17/2016 0023   PHURINE 5.0 11/17/2016 0023   GLUCOSEU NEGATIVE 11/17/2016 0023   HGBUR SMALL (A) 11/17/2016 0023   BILIRUBINUR NEGATIVE 11/17/2016 0023   KETONESUR NEGATIVE 11/17/2016 0023   PROTEINUR NEGATIVE 11/17/2016 0023   NITRITE NEGATIVE 11/17/2016 0023   LEUKOCYTESUR TRACE (A) 11/17/2016 0023      Imaging Results:    Dg Chest 2 View  Result Date: 11/17/2016 CLINICAL DATA:  Recent positive test for flu, shortness of breath EXAM: CHEST  2 VIEW COMPARISON:  11/10/2016  FINDINGS: Stable cardiomegaly and tiny effusions. No interval consolidation. No pneumothorax. Mild linear atelectasis in the left upper lobe. IMPRESSION: Cardiomegaly with tiny effusions. No interval development of acute focal pulmonary infiltrate Electronically Signed   By: Jasmine PangKim  Fujinaga M.D.   On: 11/17/2016 01:39    My personal review of EKG: Rhythm - atrial fibrillation   Assessment & Plan:    Active Problems:   HTN (hypertension)   Hypothyroidism   Influenza   1. Upper respiratory infection with influenza- influenza PCR positive for influenza A, will start patient on Tamiflu 75 mg by mouth twice a day. Continue DuoNeb nebs every 6 hours when necessary.  2. ? UTI- UA was mildly abnormal in the ED, started on ceftriaxone. We'll continue with ceftriaxone per pharmacy consultation. Follow urine culture results. 3. Chronic diastolic CHF- currently compensated, continue Lasix 40 mg by mouth daily. 4. Hypothyroidism-continue Synthroid 5. Atrial fibrillation- CHA2DS2VASc score is 5, continue eliquis, metoprolol 25 mg twice a day 6. Lethargy/mild confusion- likely from influenza, started on Tamiflu. Will monitor in the hospital. 7. Hypertension-continue metoprolol 25 mg twice a day, amlodipine 5 mg by mouth daily 8. No IV access- no IV access obtained the ED, will order PICC line.   DVT Prophylaxis-   eliquis  AM Labs Ordered, also please review Full Orders  Family Communication: Admission, patients condition and plan of care including tests being ordered have been discussed with the patient and her daughter at bedside* who indicate understanding and agree with the plan and Code Status.  Code Status:  Full code  Admission status: Observation    Time spent in minutes : 60 minutes   Lamika Connolly S M.D on 11/17/2016 at 3:37 AM  Between  7am to 7pm - Pager - [phone]. After 7pm go to [email or web address] - password [facility]  [facility] - Office  [phone]

## 2016-11-17 NOTE — Progress Notes (Addendum)
Pharmacy Antibiotic Note  Yvonne Chan is a 83 y.o. female admitted on 11/16/2016 with UTI.  Pharmacy has been consulted for ceftriaxone dosing.  Plan: Ceftriaxone 1gm IV q24h F/U cx and clinical progress  Height: 5\' 6"  (167.6 cm) Weight: 230 lb 9.6 oz (104.6 kg) IBW/kg (Calculated) : 59.3  Temp (24hrs), Avg:98.2 F (36.8 C), Min:97.7 F (36.5 C), Max:98.6 F (37 C)   Recent Labs Lab 11/13/16 0550 11/14/16 0459 11/15/16 0437 11/16/16 0556 11/17/16 0056  WBC  --   --   --   --  11.3*  CREATININE 0.56 0.56 0.66 0.63 0.70    Estimated Creatinine Clearance: 62.8 mL/min (by C-G formula based on SCr of 0.7 mg/dL).    No Known Allergies  Antimicrobials this admission: Ceftriaxone 2/10 >>   Microbiology results: 2/10 BCx: pending 2/10 UCx: pending  1/25 MRSA PCR: negative 2/9 Influenza: Positive for A  Thank you for allowing pharmacy to be a part of this patient's care.  Elder CyphersLorie Jolanda Mccann, BS Pharm D, [place] Clinical Pharmacist Pager [phone] 11/17/2016 7:44 AM

## 2016-11-17 NOTE — ED Notes (Signed)
IV access attempted via multiple attempts. MD aware.

## 2016-11-17 NOTE — Discharge Summary (Signed)
Physician Discharge Summary  Yvonne BellingMary Chan ZOX:[id number] DOB: December 21, 1928 DOA: 11/16/2016  PCP: Yvonne Chan  Admit date: 11/16/2016 Discharge date: 11/17/2016  Time spent: 45 minutes  Recommendations for Outpatient Follow-up:  -Will be discharged back to SNF today. -Continue tamiflu for 5 days.   Discharge Diagnoses:  Active Problems:   HTN (hypertension)   Hypothyroidism   Influenza   Discharge Condition: Stable and improved  Filed Weights   11/16/16 1930 11/17/16 0508  Weight: 103 kg (227 lb) 104.6 kg (230 lb 9.6 oz)    History of present illness:  As per Dr. Sharl Chan on 2/9: Yvonne Chan  is a 83 y.o. female, With history of atrial fibrillation, hypertension, hypothyroidism, CHF who was discharged yesterday to go to skilled facility. Patient was coughing, and tested positive for influenza, patient was sent to hospital for further evaluation. Patient complains of cough, mild shortness of breath. No chest pain. No nausea vomiting or diarrhea. No dysuria. Marland Kitchen.  Hospital Course:   Influenza A -Not quite clear as to why she needed to be readmitted for this reason: she has been afebrile, and without respiratory distress or increased oxygen requirements. -Would continue tamiflu for 5 days.  For rest of chronic issues please refer to DC Summary dictated by Dr. Rinaldo Chan on 2/9.  Procedures:  None   Consultations:  None  Discharge Instructions  Discharge Instructions    Diet - low sodium heart healthy    Complete by:  As directed    Increase activity slowly    Complete by:  As directed      Allergies as of 11/17/2016   No Known Allergies     Medication List    TAKE these medications   amLODipine 5 MG tablet Commonly known as:  NORVASC Take 1 tablet (5 mg total) by mouth daily.   apixaban 5 MG Tabs tablet Commonly known as:  ELIQUIS Take 1 tablet (5 mg total) by mouth 2 (two) times daily.   Fish Oil 1000 MG Caps Take 1 capsule by mouth daily.     furosemide 40 MG tablet Commonly known as:  LASIX Take 1 tablet (40 mg total) by mouth daily.   guaiFENesin 600 MG 12 hr tablet Commonly known as:  MUCINEX Take by mouth 2 (two) times daily.   ipratropium 0.02 % nebulizer solution Commonly known as:  ATROVENT Take 0.5 mg by nebulization every 6 (six) hours as needed for wheezing or shortness of breath.   levothyroxine 50 MCG tablet Commonly known as:  SYNTHROID, LEVOTHROID Take 50 mcg by mouth daily before breakfast.   methyldopa 250 MG tablet Commonly known as:  ALDOMET Take 250 mg by mouth daily.   metoprolol tartrate 25 MG tablet Commonly known as:  LOPRESSOR Take 1 tablet (25 mg total) by mouth 2 (two) times daily.   oseltamivir 75 MG capsule Commonly known as:  TAMIFLU Take 1 capsule (75 mg total) by mouth 2 (two) times daily.   OXYGEN Inhale 2 L into the lungs daily as needed. To keep sats greater or equal to 90%   potassium chloride 10 MEQ tablet Commonly known as:  K-DUR Take 10 mEq by mouth daily.   sodium chloride 1 g tablet Take 1 tablet (1 g total) by mouth 2 (two) times daily with a meal.      No Known Allergies    The results of significant diagnostics from this hospitalization (including imaging, microbiology, ancillary and laboratory) are listed below for reference.  Significant Diagnostic Studies: Dg Chest 2 View  Result Date: 11/17/2016 CLINICAL DATA:  Recent positive test for flu, shortness of breath EXAM: CHEST  2 VIEW COMPARISON:  11/10/2016 FINDINGS: Stable cardiomegaly and tiny effusions. No interval consolidation. No pneumothorax. Mild linear atelectasis in the left upper lobe. IMPRESSION: Cardiomegaly with tiny effusions. No interval development of acute focal pulmonary infiltrate Electronically Signed   By: Yvonne Chan M.D.   On: 11/17/2016 01:39   Dg Chest 2 View  Result Date: 11/10/2016 CLINICAL DATA:  Heart failure.  Shortness of breath. EXAM: CHEST  2 VIEW COMPARISON:  November 07, 2014 FINDINGS: Cardiomegaly with mild pulmonary venous congestion. Probable tiny effusions. No significant interval change. IMPRESSION: Cardiomegaly with mild pulmonary venous congestion and tiny effusions. Electronically Signed   By: Yvonne Chan M.D   On: 11/10/2016 12:47   Dg Chest 2 View  Result Date: 11/07/2016 CLINICAL DATA:  Short of breath and chest pain EXAM: CHEST  2 VIEW COMPARISON:  11/01/2016 FINDINGS: Cardiac enlargement with mild vascular congestion. Negative for edema or effusion. Mild left lower lobe atelectasis. Thoracic degenerative changes. IMPRESSION: Cardiac enlargement with mild vascular congestion. Mild left lower lobe atelectasis Electronically Signed   By: Yvonne Chan M.D.   On: 11/07/2016 15:31   Dg Chest Portable 1 View  Result Date: 11/01/2016 CLINICAL DATA:  Generalized weakness. New onset atrial fibrillation. EXAM: PORTABLE CHEST 1 VIEW COMPARISON:  None. FINDINGS: Study is limited by the patient's size and portable technique. There is cardiomegaly and pulmonary vascular congestion. Mild left basilar atelectasis is noted. No pneumothorax or pleural effusion. IMPRESSION: Cardiomegaly and pulmonary vascular congestion. Electronically Signed   By: Yvonne Chan M.D.   On: 11/01/2016 14:14    Microbiology: Recent Results (from the past 240 hour(s))  Urine culture     Status: None   Collection Time: 11/07/16  3:30 PM  Result Value Ref Range Status   Specimen Description URINE, CATHETERIZED  Final   Special Requests NONE  Final   Culture   Final    NO GROWTH Performed at [facility], [address] [place], [place] [address]    Report Status 11/09/2016 FINAL  Final  Culture, blood (routine x 2)     Status: None (Preliminary result)   Collection Time: 11/17/16 12:58 AM  Result Value Ref Range Status   Specimen Description BLOOD LEFT HAND  Final   Special Requests BOTTLES DRAWN AEROBIC ONLY 6CC  Final   Culture PENDING  Incomplete   Report  Status PENDING  Incomplete  Culture, blood (routine x 2)     Status: None (Preliminary result)   Collection Time: 11/17/16  1:12 AM  Result Value Ref Range Status   Specimen Description BLOOD LEFT ARM  Final   Special Requests BOTTLES DRAWN AEROBIC AND ANAEROBIC [facility]  Final   Culture PENDING  Incomplete   Report Status PENDING  Incomplete     Labs: Basic Metabolic Panel:  Recent Labs Lab 11/11/16 0652  11/13/16 0550 11/14/16 0459 11/15/16 0437 11/16/16 0556 11/17/16 0056  NA 125*  < > 123* 126* 129* 129* 129*  K 3.6  < > 3.5 3.9 4.2 4.2 4.4  CL 78*  < > 78* 82* 88* 88* 88*  CO2 36*  < > 35* 30 32 35* 33*  GLUCOSE 111*  < > 131* 115* 103* 126* 106*  BUN 12  < > 13 12 12 13 16   CREATININE 0.57  < > 0.56 0.56 0.66  0.63 0.70  CALCIUM 7.9*  < > 7.8* 8.0* 7.8* 8.2* 8.6*  MG 1.2*  --  1.3*  --  1.8  --   --   PHOS  --   --   --  2.6  --  3.4  --   < > = values in this interval not displayed. Liver Function Tests:  Recent Labs Lab 11/12/16 0546 11/14/16 0459 11/16/16 0556 11/17/16 0056  AST 42*  --   --  25  ALT 24  --   --  20  ALKPHOS 54  --   --  53  BILITOT 1.0  --   --  0.9  PROT 6.2*  --   --  6.8  ALBUMIN 2.4* 2.4* 2.4* 2.5*   No results for input(s): LIPASE, AMYLASE in the last 168 hours. No results for input(s): AMMONIA in the last 168 hours. CBC:  Recent Labs Lab 11/17/16 0056  WBC 11.3*  NEUTROABS 9.1*  HGB 14.0  HCT 42.7  MCV 92.0  PLT 389   Cardiac Enzymes: No results for input(s): CKTOTAL, CKMB, CKMBINDEX, TROPONINI in the last 168 hours. BNP: BNP (last 3 results)  Recent Labs  11/07/16 1525 11/17/16 0056  BNP 630.0* 202.0*    ProBNP (last 3 results) No results for input(s): PROBNP in the last 8760 hours.  CBG:  Recent Labs Lab 11/12/16 0802 11/12/16 1229  GLUCAP 126* 147*       Signed:  Chaya Jan  [facility] Pager: [phone] 11/17/2016, 11:17 AM

## 2016-11-18 ENCOUNTER — Inpatient Hospital Stay
Admission: RE | Admit: 2016-11-18 | Discharge: 2016-11-28 | Disposition: A | Discharge status: Other Acute Inpatient Hospital | Payer: Medicare HMO | Source: Ambulatory Visit | Attending: Internal Medicine | Admitting: Internal Medicine

## 2016-11-18 DIAGNOSIS — J111 Influenza due to unidentified influenza virus with other respiratory manifestations: Secondary | ICD-10-CM | POA: Diagnosis not present

## 2016-11-18 NOTE — Progress Notes (Signed)
Patient seen and examined, discussed with daughter at bedside. Patient was to be discharged back to SNF yesterday, however due to issues at SNF this was not possible. Will be discharged back today, no changes to discharge summary dated 2/10.  Peggye PittEstela Hernandez, MD [facility] Pager: [phone]

## 2016-11-18 NOTE — Progress Notes (Signed)
[facility] per Dr. Ardyth HarpsHernandez.  Pt and daughter updated at beside. Verbalized understanding. Pt's VSS. Pt's IV site D/C'd and WDL. Report called to Wynona Caneshristine, [facility]. Verbalized understanding. Pt left floor via hospital bed in stable condition accompanied by NT.

## 2016-11-19 ENCOUNTER — Encounter: Payer: Self-pay | Admitting: Internal Medicine

## 2016-11-19 ENCOUNTER — Encounter (HOSPITAL_COMMUNITY)
Admission: RE | Admit: 2016-11-19 | Discharge: 2016-11-19 | Disposition: A | Discharge status: Home / Self Care | Payer: Medicare HMO | Source: Skilled Nursing Facility | Attending: Internal Medicine | Admitting: Internal Medicine

## 2016-11-19 ENCOUNTER — Non-Acute Institutional Stay (SKILLED_NURSING_FACILITY): Payer: Medicare HMO | Admitting: Internal Medicine

## 2016-11-19 DIAGNOSIS — I4891 Unspecified atrial fibrillation: Secondary | ICD-10-CM | POA: Insufficient documentation

## 2016-11-19 DIAGNOSIS — E038 Other specified hypothyroidism: Secondary | ICD-10-CM

## 2016-11-19 DIAGNOSIS — J111 Influenza due to unidentified influenza virus with other respiratory manifestations: Secondary | ICD-10-CM

## 2016-11-19 DIAGNOSIS — R531 Weakness: Secondary | ICD-10-CM

## 2016-11-19 DIAGNOSIS — I10 Essential (primary) hypertension: Secondary | ICD-10-CM

## 2016-11-19 DIAGNOSIS — M6281 Muscle weakness (generalized): Secondary | ICD-10-CM | POA: Insufficient documentation

## 2016-11-19 DIAGNOSIS — M792 Neuralgia and neuritis, unspecified: Secondary | ICD-10-CM | POA: Insufficient documentation

## 2016-11-19 DIAGNOSIS — E039 Hypothyroidism, unspecified: Secondary | ICD-10-CM | POA: Insufficient documentation

## 2016-11-19 DIAGNOSIS — E871 Hypo-osmolality and hyponatremia: Secondary | ICD-10-CM | POA: Insufficient documentation

## 2016-11-19 DIAGNOSIS — I5031 Acute diastolic (congestive) heart failure: Secondary | ICD-10-CM | POA: Diagnosis not present

## 2016-11-19 DIAGNOSIS — M545 Low back pain: Secondary | ICD-10-CM | POA: Insufficient documentation

## 2016-11-19 LAB — BASIC METABOLIC PANEL
ANION GAP: 10 (ref 5–15)
BUN: 17 mg/dL (ref 6–20)
CHLORIDE: 86 mmol/L — AB (ref 101–111)
CO2: 34 mmol/L — AB (ref 22–32)
Calcium: 8.6 mg/dL — ABNORMAL LOW (ref 8.9–10.3)
Creatinine, Ser: 0.6 mg/dL (ref 0.44–1.00)
GFR calc non Af Amer: >60 mL/min (ref >60)
Glucose, Bld: 123 mg/dL — ABNORMAL HIGH (ref 65–99)
Potassium: 4.2 mmol/L (ref 3.5–5.1)
Sodium: 130 mmol/L — ABNORMAL LOW (ref 135–145)

## 2016-11-19 LAB — CBC WITH DIFFERENTIAL/PLATELET
BASOS PCT: 0 %
Basophils Absolute: 0 10*3/uL (ref 0.0–0.1)
Eosinophils Absolute: 0.1 10*3/uL (ref 0.0–0.7)
Eosinophils Relative: 1 %
HEMATOCRIT: 45.3 % (ref 36.0–46.0)
HEMOGLOBIN: 15.1 g/dL — AB (ref 12.0–15.0)
LYMPHS PCT: 6 %
Lymphs Abs: 0.8 10*3/uL (ref 0.7–4.0)
MCH: 30.4 pg (ref 26.0–34.0)
MCHC: 33.3 g/dL (ref 30.0–36.0)
MCV: 91.1 fL (ref 78.0–100.0)
MONOS PCT: 6 %
Monocytes Absolute: 0.7 10*3/uL (ref 0.1–1.0)
NEUTROS ABS: 11.2 10*3/uL — AB (ref 1.7–7.7)
NEUTROS PCT: 87 %
Platelets: 398 10*3/uL (ref 150–400)
RBC: 4.97 MIL/uL (ref 3.87–5.11)
RDW: 13.2 % (ref 11.5–15.5)
WBC: 12.9 10*3/uL — ABNORMAL HIGH (ref 4.0–10.5)

## 2016-11-19 LAB — URINE CULTURE: CULTURE: NO GROWTH

## 2016-11-19 NOTE — Progress Notes (Signed)
Provider: Einar CrowAnjali,Braylie Badami  Location:   [facility] Room Number: 126/P Place of Service:  SNF (31)  PCP: Abran RichardBAUCOM, JENNY B, PA-C Patient Care Team: Phyllis GingerJenny B Baucom, PA-C as PCP - General (Physician Assistant)  Extended Emergency Contact Information Primary Emergency Contact: Pilar GrammesGraves,[place]  [place] Home Phone: [phone] Mobile Phone: [phone] Relation: Daughter Secondary Emergency Contact: Fransisco HertzWalker,Jerry  [place] Home Phone: [phone] Relation: Son  Code Status: Full Code Goals of Care: Advanced Directive information Advanced Directives 11/19/2016  Does Patient Have a Medical Advance Directive? Yes  Type of Advance Directive (No Data)  Does patient want to make changes to medical advance directive? No - Patient declined  Would patient like information on creating a medical advance directive? No - Patient declined      Chief Complaint  Patient presents with  . New Admit To SNF    HPI: Patient is a 83 y.o. female seen today for admission to SNF for therapy. Patient has h/o Hypertension And Hypothyroidism Was admitted in 11/01/16 with New onset Atrial Fibrillation with RVR and Diastolic CHF. Initially she was send home after starting on Cardizem and Eliquis And Lasix . She was readmitted on  11/07/16 with Hyponatremia sodium of 119and weakness. This time she was treated with Free water restriction and IV fluids. She was discharged to SNF. In the facility Patient had fever and Cough She also Had Oxygen Sats in 80's. She was tested for FLU and was positive. She was send again to the hospital and was discharged back to facility yesterday to finish treatment for Influenza and to do therapy. Patient is isolated in her room. Still Looks lethargic. She has fever this morning. She just told me she wanted to be left alone and wants to just sleep. C/O Feeling very weak. Denied any Fever chills Nausea or vomiting. D/W nurses Patient  seem more confused. Has eaten well this morning. Sleeping most of the time.    Past Medical History:  Diagnosis Date  . Atrial fibrillation (HCC)   . Essential hypertension   . Hypothyroidism   . Lumbago   . Neuralgia   . Neuritis    Past Surgical History:  Procedure Laterality Date  . ABDOMINAL HYSTERECTOMY    . APPENDECTOMY      reports that she has never smoked. She has never used smokeless tobacco. She reports that she does not drink alcohol or use drugs. Social History   Social History  . Marital status: Widowed    Spouse name: N/A  . Number of children: N/A  . Years of education: N/A   Occupational History  . retired    Social History Main Topics  . Smoking status: Never Smoker  . Smokeless tobacco: Never Used  . Alcohol use No  . Drug use: No  . Sexual activity: Not on file   Other Topics Concern  . Not on file   Social History Narrative  . No narrative on file    Functional Status Survey:    History reviewed. No pertinent family history.  There are no preventive care reminders to display for this patient.  No Known Allergies  Current Outpatient Prescriptions on File Prior to Visit  Medication Sig Dispense Refill  . amLODipine (NORVASC) 5 MG tablet Take 1 tablet (5 mg total) by mouth daily. 10 tablet 0  . apixaban (ELIQUIS) 5 MG TABS tablet Take 1 tablet (5 mg total) by mouth 2 (two) times daily. 60 tablet 2  .  furosemide (LASIX) 40 MG tablet Take 1 tablet (40 mg total) by mouth daily. 30 tablet 2  . guaiFENesin (MUCINEX) 600 MG 12 hr tablet Take by mouth 2 (two) times daily.    Marland Kitchen ipratropium (ATROVENT) 0.02 % nebulizer solution Take 0.5 mg by nebulization every 6 (six) hours as needed for wheezing or shortness of breath.    . levothyroxine (SYNTHROID, LEVOTHROID) 50 MCG tablet Take 50 mcg by mouth daily before breakfast.    . methyldopa (ALDOMET) 250 MG tablet Take 250 mg by mouth daily.    . metoprolol tartrate (LOPRESSOR) 25 MG tablet Take 1  tablet (25 mg total) by mouth 2 (two) times daily. 60 tablet 2  . Omega-3 Fatty Acids (FISH OIL) 1000 MG CAPS Take 1 capsule by mouth daily.    Marland Kitchen oseltamivir (TAMIFLU) 75 MG capsule Take 1 capsule (75 mg total) by mouth 2 (two) times daily. 10 capsule 0  . OXYGEN Inhale 2 L into the lungs daily as needed. To keep sats greater or equal to 90%    . potassium chloride (K-DUR) 10 MEQ tablet Take 10 mEq by mouth daily.    . sodium chloride 1 g tablet Take 1 tablet (1 g total) by mouth 2 (two) times daily with a meal. 30 tablet 0   No current facility-administered medications on file prior to visit.      Review of Systems  Constitutional: Positive for activity change, appetite change, fatigue and fever. Negative for chills and diaphoresis.  HENT: Negative for congestion, postnasal drip and rhinorrhea.   Respiratory: Positive for cough and wheezing. Negative for apnea, choking, chest tightness and shortness of breath.   Cardiovascular: Negative for chest pain, palpitations and leg swelling.  Gastrointestinal: Positive for abdominal pain. Negative for constipation, diarrhea, nausea and vomiting.  Genitourinary: Negative.   Musculoskeletal: Positive for arthralgias. Negative for gait problem, myalgias and neck pain.  Skin: Negative.   Neurological: Positive for weakness. Negative for dizziness and light-headedness.  Psychiatric/Behavioral: Negative.     Vitals:   11/19/16 0956  BP: 139/84  Pulse: 94  Resp: 20  Temp: (!) 100.6 F (38.1 C)  TempSrc: Oral   There is no height or weight on file to calculate BMI. Physical Exam  Constitutional: She appears well-developed and well-nourished.  HENT:  Head: Normocephalic.  Mouth/Throat: Oropharynx is clear and moist.  Eyes: Pupils are equal, round, and reactive to light.  Neck: Neck supple.  Cardiovascular: Normal rate and normal heart sounds.  An irregular rhythm present.  No murmur heard. Pulmonary/Chest: Effort normal and breath sounds  normal.  Bilateral Expiratory Wheezing.  Abdominal: Soft. Bowel sounds are normal. She exhibits no distension. There is no tenderness. There is no rebound.  Musculoskeletal:  Trace edema B/L  Lymphadenopathy:    She has no cervical adenopathy.  Neurological: She is alert.  Was unable to check as patient was not cooperative with Exam.  Skin: Skin is warm and dry.  Psychiatric:  Could not be evaluated as patient sleepy.    Labs reviewed: Basic Metabolic Panel:  Recent Labs  16/10/96 0652  11/13/16 0550 11/14/16 0459 11/15/16 0437 11/16/16 0556 11/17/16 0056 11/19/16 0715  NA 125*  < > 123* 126* 129* 129* 129* 130*  K 3.6  < > 3.5 3.9 4.2 4.2 4.4 4.2  CL 78*  < > 78* 82* 88* 88* 88* 86*  CO2 36*  < > 35* 30 32 35* 33* 34*  GLUCOSE 111*  < > 131* 115* 103*  126* 106* 123*  BUN 12  < > 13 12 12 13 16 17   CREATININE 0.57  < > 0.56 0.56 0.66 0.63 0.70 0.60  CALCIUM 7.9*  < > 7.8* 8.0* 7.8* 8.2* 8.6* 8.6*  MG 1.2*  --  1.3*  --  1.8  --   --   --   PHOS  --   --   --  2.6  --  3.4  --   --   < > = values in this interval not displayed. Liver Function Tests:  Recent Labs  11/10/16 0646 11/12/16 0546 11/14/16 0459 11/16/16 0556 11/17/16 0056  AST 55* 42*  --   --  25  ALT 28 24  --   --  20  ALKPHOS 68 54  --   --  53  BILITOT 0.7 1.0  --   --  0.9  PROT 6.4* 6.2*  --   --  6.8  ALBUMIN 2.4* 2.4* 2.4* 2.4* 2.5*   No results for input(s): LIPASE, AMYLASE in the last 8760 hours. No results for input(s): AMMONIA in the last 8760 hours. CBC:  Recent Labs  11/10/16 0646 11/17/16 0056 11/19/16 0715  WBC 7.1 11.3* 12.9*  NEUTROABS 5.3 9.1* 11.2*  HGB 13.3 14.0 15.1*  HCT 38.7 42.7 45.3  MCV 88.2 92.0 91.1  PLT 335 389 398   Cardiac Enzymes:  Recent Labs  11/01/16 1416 11/07/16 1525  TROPONINI <0.03 <0.03   BNP: Invalid input(s): POCBNP No results found for: HGBA1C Lab Results  Component Value Date   TSH 0.878 11/08/2016   No results found for:  VITAMINB12 No results found for: FOLATE No results found for: IRON, TIBC, FERRITIN  Imaging and Procedures obtained prior to SNF admission: No results found.  Assessment/Plan  Influenza Patient Continues to be weak and Febrile. Continue Vitals Q shift Tylenol PRN Will start on Duo nebs for wheezing Supplement Oxygen as needed. Continue on Tamiflu.  Acute diastolic CHF (congestive heart failure) Daily Weights Her weight in hospital was 102 kg. Do not know her Dry weight. On Lasix and Potassium. Last Chest xray did not show any Edema.  Atrial fibrillation with RVR (HCC) Rate controlled on Metoprolol Tolerating Eliquis.  Essential hypertension BP slightly up. Continue to monitor. On Norvasc and Methydopa. Will consider tapering off aldomet and increase Metoprolol.   Hypothyroidism TSH normal. Continue same dose.  Hyponatremia Sodium is improved. On sodium tablets. Would continue to monitor. Would not put on Fluid restrictions as fear of dehydration with Fever and Flu. And Lasix  Weakness Most likely due to Influenza. Will monitor patient and start therapy in 48 hours.  Leucocytosis Elevated. If goes up will repeat Cxray. Urine was negative before.   Family/ staff Communication:   Labs/tests ordered: CBC, CMP, POX, Weights,  Total time spent in this patient care encounter was 50_ minutes; greater than 50% of the visit spentcoordinating care with Nurses for problems addressed at this encounter.

## 2016-11-22 ENCOUNTER — Encounter (HOSPITAL_COMMUNITY)
Admission: RE | Admit: 2016-11-22 | Discharge: 2016-11-22 | Disposition: A | Discharge status: Home / Self Care | Payer: Medicare HMO | Source: Skilled Nursing Facility | Attending: *Deleted | Admitting: *Deleted

## 2016-11-22 LAB — CBC WITH DIFFERENTIAL/PLATELET
BASOS PCT: 0 %
Basophils Absolute: 0 10*3/uL (ref 0.0–0.1)
EOS ABS: 0.2 10*3/uL (ref 0.0–0.7)
EOS PCT: 1 %
HEMATOCRIT: 40.7 % (ref 36.0–46.0)
HEMOGLOBIN: 13.3 g/dL (ref 12.0–15.0)
Lymphocytes Relative: 7 %
Lymphs Abs: 0.9 10*3/uL (ref 0.7–4.0)
MCH: 30 pg (ref 26.0–34.0)
MCHC: 32.7 g/dL (ref 30.0–36.0)
MCV: 91.9 fL (ref 78.0–100.0)
MONO ABS: 0.7 10*3/uL (ref 0.1–1.0)
MONOS PCT: 5 %
NEUTROS PCT: 87 %
Neutro Abs: 10.8 10*3/uL — ABNORMAL HIGH (ref 1.7–7.7)
Platelets: 372 10*3/uL (ref 150–400)
RBC: 4.43 MIL/uL (ref 3.87–5.11)
RDW: 13.5 % (ref 11.5–15.5)
WBC: 12.5 10*3/uL — ABNORMAL HIGH (ref 4.0–10.5)

## 2016-11-22 LAB — CULTURE, BLOOD (ROUTINE X 2)
CULTURE: NO GROWTH
CULTURE: NO GROWTH

## 2016-11-22 LAB — COMPREHENSIVE METABOLIC PANEL
ALK PHOS: 52 U/L (ref 38–126)
ALT: 19 U/L (ref 14–54)
AST: 27 U/L (ref 15–41)
Albumin: 2.3 g/dL — ABNORMAL LOW (ref 3.5–5.0)
Anion gap: 9 (ref 5–15)
BILIRUBIN TOTAL: 1.3 mg/dL — AB (ref 0.3–1.2)
BUN: 15 mg/dL (ref 6–20)
CALCIUM: 8.1 mg/dL — AB (ref 8.9–10.3)
CHLORIDE: 90 mmol/L — AB (ref 101–111)
CO2: 33 mmol/L — ABNORMAL HIGH (ref 22–32)
CREATININE: 0.65 mg/dL (ref 0.44–1.00)
GFR calc Af Amer: >60 mL/min (ref >60)
GFR calc non Af Amer: >60 mL/min (ref >60)
Glucose, Bld: 97 mg/dL (ref 65–99)
Potassium: 3.8 mmol/L (ref 3.5–5.1)
Sodium: 132 mmol/L — ABNORMAL LOW (ref 135–145)
TOTAL PROTEIN: 6.2 g/dL — AB (ref 6.5–8.1)

## 2016-11-24 ENCOUNTER — Non-Acute Institutional Stay (SKILLED_NURSING_FACILITY): Payer: Medicare HMO | Admitting: Internal Medicine

## 2016-11-24 ENCOUNTER — Encounter (HOSPITAL_COMMUNITY)
Admission: AD | Admit: 2016-11-24 | Discharge: 2016-11-24 | Disposition: A | Discharge status: Home / Self Care | Payer: Medicare HMO | Source: Skilled Nursing Facility | Attending: *Deleted | Admitting: *Deleted

## 2016-11-24 DIAGNOSIS — R05 Cough: Secondary | ICD-10-CM | POA: Diagnosis not present

## 2016-11-24 DIAGNOSIS — R0989 Other specified symptoms and signs involving the circulatory and respiratory systems: Secondary | ICD-10-CM | POA: Diagnosis not present

## 2016-11-24 DIAGNOSIS — I5031 Acute diastolic (congestive) heart failure: Secondary | ICD-10-CM

## 2016-11-24 DIAGNOSIS — R059 Cough, unspecified: Secondary | ICD-10-CM

## 2016-11-24 LAB — COMPREHENSIVE METABOLIC PANEL
ALBUMIN: 2.2 g/dL — AB (ref 3.5–5.0)
ALK PHOS: 64 U/L (ref 38–126)
ALT: 17 U/L (ref 14–54)
ANION GAP: 7 (ref 5–15)
AST: 29 U/L (ref 15–41)
BUN: 20 mg/dL (ref 6–20)
CALCIUM: 7.7 mg/dL — AB (ref 8.9–10.3)
CO2: 32 mmol/L (ref 22–32)
Chloride: 90 mmol/L — ABNORMAL LOW (ref 101–111)
Creatinine, Ser: 0.71 mg/dL (ref 0.44–1.00)
GFR calc non Af Amer: >60 mL/min (ref >60)
GLUCOSE: 140 mg/dL — AB (ref 65–99)
POTASSIUM: 3.8 mmol/L (ref 3.5–5.1)
SODIUM: 129 mmol/L — AB (ref 135–145)
Total Bilirubin: 1 mg/dL (ref 0.3–1.2)
Total Protein: 6.4 g/dL — ABNORMAL LOW (ref 6.5–8.1)

## 2016-11-24 LAB — TSH: TSH: 1.457 u[IU]/mL (ref 0.350–4.500)

## 2016-11-25 NOTE — Progress Notes (Signed)
This is an acute visit.  Level care skilled.  Facility is [facility].  Chief complaint-acute visit secondary to continued cough.  History of present illness.  Patient is a pleasant 83 year old female seen today for nursing staff concerns of continued cough and some chest congestion.  She was initially hospitalized in late January with new onset atrial fibrillation with rapid ventricular rate diastolic CHF.  Initially was sent home after starting on Cardizem and Eliquis and Lasix.  She was readmitted on 11/07/2016 with hyponatremia with a sodium of 119 and weakness.  She was treated with free water restriction and IV fluids.  She read the facility she did have a fever and cough with oxygen saturation dropping into the 80s-she did test positive for the flu and was sent to the hospital and says currently has now been discharged back to the facility-she has finished treatment for influenza.  She continues to have some cough and congestion however.  X-ray done in the hospital did not really show any acute issues it did show tiny effusions.  She is not complaining of any acute shortness of breath but does have continued cough vital signs appear to be stable O2 saturation is in the mid 90s on room air.  She continues on Mucinex twice a day as well as Atrovent nebulizers every 6 hours when necessary  She is also on Lasix 40 mg a day with potassium supplementation--her weight appears to be stable although she has a poor appetite-  Past Medical History:  Diagnosis Date  . Atrial fibrillation (HCC)   . Essential hypertension   . Hypothyroidism   . Lumbago   . Neuralgia   . Neuritis         Past Surgical History:  Procedure Laterality Date  . ABDOMINAL HYSTERECTOMY    . APPENDECTOMY      reports that she has never smoked. She has never used smokeless tobacco. She reports that she does not drink alcohol or use drugs. Social History        Social History  .  Marital status: Widowed    Spouse name: N/A  . Number of children: N/A  . Years of education: N/A       Occupational History  . retired        Social History Main Topics  . Smoking status: Never Smoker  . Smokeless tobacco: Never Used  . Alcohol use No  . Drug use: No  . Sexual activity: Not on file       Other Topics Concern  . Not on file      Social History Narrative  . No narrative on file    Functional Status Survey:  History reviewed. No pertinent family history.  There are no preventive care reminders to display for this patient.  No Known Allergies        Current Outpatient Prescriptions on File Prior to Visit  Medication Sig Dispense Refill  . amLODipine (NORVASC) 5 MG tablet Take 1 tablet (5 mg total) by mouth daily. 10 tablet 0  . apixaban (ELIQUIS) 5 MG TABS tablet Take 1 tablet (5 mg total) by mouth 2 (two) times daily. 60 tablet 2  . furosemide (LASIX) 40 MG tablet Take 1 tablet (40 mg total) by mouth daily. 30 tablet 2  . guaiFENesin (MUCINEX) 600 MG 12 hr tablet Take by mouth 2 (two) times daily.    Marland Kitchen. ipratropium (ATROVENT) 0.02 % nebulizer solution Take 0.5 mg by nebulization every 6 (six) hours as  needed for wheezing or shortness of breath.    . levothyroxine (SYNTHROID, LEVOTHROID) 50 MCG tablet Take 50 mcg by mouth daily before breakfast.    . methyldopa (ALDOMET) 250 MG tablet Take 250 mg by mouth daily.    . metoprolol tartrate (LOPRESSOR) 25 MG tablet Take 1 tablet (25 mg total) by mouth 2 (two) times daily. 60 tablet 2  . Omega-3 Fatty Acids (FISH OIL) 1000 MG CAPS Take 1 capsule by mouth daily.    Marland Kitchen oseltamivir (TAMIFLU) 75 MG capsule Take 1 capsule (75 mg total) by mouth 2 (two) times daily. 10 capsule 0  . OXYGEN Inhale 2 L into the lungs daily as needed. To keep sats greater or equal to 90%    . potassium chloride (K-DUR) 10 MEQ tablet Take 10 mEq by mouth daily.    . sodium chloride 1 g tablet Take 1 tablet (1 g  total) by mouth 2 (two) times daily with a meal. 30 tablet 0   No current facility-administered medications on file prior to visit.      Review of Systems  Constitutional: Positive for activity change, appetite change, fatigue r. Negative for chills and diaphoresis.  HENT: Negative for congestion, postnasal drip and rhinorrhea.   Respiratory: Positive for cough and wheezing.--Does not really complain of increased shortness of breath Negative for apnea, choking, chest tightness and shortness of breath.   Cardiovascular: Negative for chest pain, palpitations Does have lower extremity edema Gastrointestinal: . Negative for constipation, diarrhea, nausea and vomiting. Or acute abdominal pain Genitourinary: Negative.   Musculoskeletal: Positive for arthralgias. Negative for gait problem, myalgias and neck pain.  Skin: Negative.   Neurological: Positive for weakness. Negative for dizziness and light-headedness.  Psychiatric/Behavioral: Negative.      Temperature is 98.3 pulse 70 respirations 18 blood pressure 101/50 O2 saturation is 96% on room air her weight is 229 Physical Exam  Constitutional: She appears well-developed and well-nourished. No sign of distress HENT:  Head: Normocephalic.  Mouth/Throat: Oropharynx is clear and moist.  Eyes: Pupils are equal, round, and reactive to light.  Neck: Neck supple.  Cardiovascular: Normal rate and normal heart sounds.  An irregular rhythm present.  No murmur heard. Pulmonary/Chest: No labored breathing there is some mild diffuse wheezing on expiration this appears to be improved compared to my initial exam last week    Abdominal: Soft. Bowel sounds are normal. She exhibits no distension. There is no tenderness. There is no rebound.  Musculoskeletal:   has 1+ lower extremity edema with obese changes   Lymphadenopathy:    She has no cervical adenopathy.  Neurological: She is alert.  Was unable to check as patient was not cooperative  with Exam.  Skin: Skin is warm and dry.  Psychiatric:   she is alert pleasant and appropriate     Labs reviewed:  11/22/2016.  Sodium 132 potassium 3.8 BUN 15 creatinine 0.65.  Albumin 2.3 total bilirubin 1.3 otherwise liver function tests within normal limits.  WBC 12.5 hemoglobin 13.3 platelets 372   Basic Metabolic Panel:  Recent Labs (within last 365 days)   Recent Labs  11/11/16 0652  11/13/16 0550 11/14/16 0459 11/15/16 0437 11/16/16 0556 11/17/16 0056 11/19/16 0715  NA 125*  < > 123* 126* 129* 129* 129* 130*  K 3.6  < > 3.5 3.9 4.2 4.2 4.4 4.2  CL 78*  < > 78* 82* 88* 88* 88* 86*  CO2 36*  < > 35* 30 32 35* 33* 34*  GLUCOSE  111*  < > 131* 115* 103* 126* 106* 123*  BUN 12  < > 13 12 12 13 16 17   CREATININE 0.57  < > 0.56 0.56 0.66 0.63 0.70 0.60  CALCIUM 7.9*  < > 7.8* 8.0* 7.8* 8.2* 8.6* 8.6*  MG 1.2*  --  1.3*  --  1.8  --   --   --   PHOS  --   --   --  2.6  --  3.4  --   --   < > = values in this interval not displayed.   Liver Function Tests:  Recent Labs (within last 365 days)   Recent Labs  11/10/16 0646 11/12/16 0546 11/14/16 0459 11/16/16 0556 11/17/16 0056  AST 55* 42*  --   --  25  ALT 28 24  --   --  20  ALKPHOS 68 54  --   --  53  BILITOT 0.7 1.0  --   --  0.9  PROT 6.4* 6.2*  --   --  6.8  ALBUMIN 2.4* 2.4* 2.4* 2.4* 2.5*     Recent Labs (within last 365 days)  No results for input(s): LIPASE, AMYLASE in the last 8760 hours.   Recent Labs (within last 365 days)  No results for input(s): AMMONIA in the last 8760 hours.   CBC:  Recent Labs (within last 365 days)   Recent Labs  11/10/16 0646 11/17/16 0056 11/19/16 0715  WBC 7.1 11.3* 12.9*  NEUTROABS 5.3 9.1* 11.2*  HGB 13.3 14.0 15.1*  HCT 38.7 42.7 45.3  MCV 88.2 92.0 91.1  PLT 335 389 398     Cardiac Enzymes:  Recent Labs (within last 365 days)   Recent Labs  11/01/16 1416 11/07/16 1525  TROPONINI <0.03 <0.03     BNP: Recent Labs (within  last 365 days)  Invalid input(s): POCBNP   Recent Labs  No results found for: HGBA1C   Recent Labs       Lab Results  Component Value Date   TSH 0.878 11/08/2016     Recent Labs  No results found for: VITAMINB12   Recent Labs  No results found for: FOLATE   Recent Labs  No results found for: IRON, TIBC, FERRITIN     Assessment and plan.  #1-history of continued cough with some congestion-she continues on Mucinex as well as nebulizers will order a 2 view chest x-ray for follow-up-clinically she appears to be stable but does have continued cough and congestion. With continued weakness and per nursing staff at times some increased edema  Also would like to rule out pulmonary edema here with her history of CHF.  She has just completed a course of Tamiflu for history of influenza  She does have a mildly elevated white count around 12,000 will update this as well also will update a metabolic panel as well as a BNP  [id number]

## 2016-11-26 ENCOUNTER — Non-Acute Institutional Stay (SKILLED_NURSING_FACILITY): Payer: Medicare HMO | Admitting: Internal Medicine

## 2016-11-26 ENCOUNTER — Encounter: Payer: Self-pay | Admitting: Internal Medicine

## 2016-11-26 DIAGNOSIS — I5031 Acute diastolic (congestive) heart failure: Secondary | ICD-10-CM

## 2016-11-26 DIAGNOSIS — E871 Hypo-osmolality and hyponatremia: Secondary | ICD-10-CM

## 2016-11-26 DIAGNOSIS — R600 Localized edema: Secondary | ICD-10-CM

## 2016-11-26 DIAGNOSIS — R918 Other nonspecific abnormal finding of lung field: Secondary | ICD-10-CM

## 2016-11-26 NOTE — Progress Notes (Signed)
Location:   [facility] Room Number: 126/P Place of Service:  SNF [phone]) Provider:  Adah Perl, PA-C  Patient Care Team: Phyllis Ginger as PCP - General (Physician Assistant)  Extended Emergency Contact Information Primary Emergency Contact: Pilar Grammes [place] Home Phone: [phone] Mobile Phone: [phone] Relation: Daughter Secondary Emergency Contact: Fransisco Hertz [place] of [place] Home Phone: [phone] Relation: Son  Code Status:  Full Code Goals of care: Advanced Directive information Advanced Directives 11/26/2016  Does Patient Have a Medical Advance Directive? Yes  Type of Advance Directive (No Data)  Does patient want to make changes to medical advance directive? No - Patient declined  Would patient like information on creating a medical advance directive? No - Patient declined     Chief Complaint  Patient presents with  . Acute Visit    F/U for pneumonia and right arm edema    HPI:  Pt is a 83 y.o. female seen today for an acute visit for Follow-up of a left base infiltrate-as well as right arm edema.  She has a fairly complicated history including recent hospitalization for new onset atrial fibrillation as well as diastolic CHF.  She is on Cardizem as well as Lasix 40 mg a day continues on Eliquis for anticoagulation.  This was complicated with an readmission for hyponatremia she was treated with free water restriction and IV fluids.  Sodium was 129 on lab over this weekend this is been a recent baseline Will update this later this week.  This discomfort located further with a history recently of influenza that was treated with Tamiflu-however she continued to have some cough and congestion so we did order a chest x-ray which has come back showing a left base infiltrate.  She has been started on Levaquin course to run through February 24.  She is also on duo nebs every 6  hours--.  She is afebrile vital signs appear stable to saturation is 97% on room air.  She is also had some right arm edema apparently this was a result of infiltration from an IV-edema is persisting it does not appear to be acutely painful or erythematous however does appear to be somewhat persistent.       Past Medical History:  Diagnosis Date  . Atrial fibrillation (HCC)   . Essential hypertension   . Hypothyroidism   . Lumbago   . Neuralgia   . Neuritis    Past Surgical History:  Procedure Laterality Date  . ABDOMINAL HYSTERECTOMY    . APPENDECTOMY      No Known Allergies  Current Outpatient Prescriptions on File Prior to Visit  Medication Sig Dispense Refill  . amLODipine (NORVASC) 5 MG tablet Take 1 tablet (5 mg total) by mouth daily. 10 tablet 0  . apixaban (ELIQUIS) 5 MG TABS tablet Take 1 tablet (5 mg total) by mouth 2 (two) times daily. 60 tablet 2  . furosemide (LASIX) 40 MG tablet Take 1 tablet (40 mg total) by mouth daily. 30 tablet 2  . ipratropium (ATROVENT) 0.02 % nebulizer solution Take 0.5 mg by nebulization every 6 (six) hours as needed for wheezing or shortness of breath.    . levothyroxine (SYNTHROID, LEVOTHROID) 50 MCG tablet Take 50 mcg by mouth daily before breakfast.    . methyldopa (ALDOMET) 250 MG tablet Take 250 mg by mouth daily.    . metoprolol tartrate (LOPRESSOR) 25 MG tablet Take 1 tablet (25 mg total) by  mouth 2 (two) times daily. 60 tablet 2  . Omega-3 Fatty Acids (FISH OIL) 1000 MG CAPS Take 1 capsule by mouth daily.    . OXYGEN Inhale 2 L into the lungs daily as needed. To keep sats greater or equal to 90%    . potassium chloride (K-DUR) 10 MEQ tablet Take 10 mEq by mouth daily.    . sodium chloride 1 g tablet Take 1 tablet (1 g total) by mouth 2 (two) times daily with a meal. 30 tablet 0   No current facility-administered medications on file prior to visit.      Review of Systems   In general she says she feels a bit stronger but  still has significant weakness probably still has somewhat poor appetite.  Skin does not complain of rashes or itching again does have edema of the right arm.  Head ears eyes nose mouth and throat not complaining of any visual changes or sore throat.  Respiratory does not really complain of shortness of breath but has been diagnosed with an infiltrate recently completed Tamiflu for flu-has a continued cough that appears to be relatively nonproductive.  Cardiac does not complaining of chest pain does have lower extremity edema.  GI does not complain of nausea vomiting diarrhea constipation or abdominal discomfort.  GU is not complaining of dysuria.  Musculoskeletal has somewhat diffuse arthritic pain and weakness but does not complaining of acute pain at this point.  Neurologic other than weakness does not complain of any dizziness headache or syncopal-type feelings.  Psych does not really complain of any depression or anxiety currently.     There is no immunization history on file for this patient. Pertinent  Health Maintenance Due  Topic Date Due  . DEXA SCAN  12/29/1995  . PNA vac Low Risk Adult (1 of 2 - PCV13) 12/29/1995  . INFLUENZA VACCINE  05/08/2016   No flowsheet data found. Functional Status Survey:   Physical exam Temperature is 97.7 pulse 69 respirations 18 blood pressure 105/74 O2 saturation is 97% on room air.--Weight is 232 pounds  In general this is a pleasant obese elderly female in no distress lying comfortably in bed she appears weak but stronger than when I initially evaluated her [place] week ago.  Her skin is warm and dry she does have some scattered bruising she also has increased edema that is cool to touch non-erythematous of her right arm this extends pretty much upper and lower arm-radial pulse is intact.  Oropharynx clear mucous membranes appear fairly moist.  Chest she has somewhat reduced air entry some slight congestion on the left data  appeared that clear with cough.  There is no labored breathing.  Heart is regular irregular rate and rhythm without murmur gallop or rub she continues to have what appears to be her baseline lower extremity edema.  Abdomen is obese soft nontender with positive bowel sounds.  Musculoskeletal is able to move all extremities 4 but has weakness most prominent lower extremities-she does have edema of her right arm as noted above in the skin section.  Neurologic is grossly intact her speech is clear she is alert.  Psych she appears alert and oriented pleasant and appropriate  Physical Exam Please see above Labs reviewed:  Recent Labs  11/11/16 0652  11/13/16 0550 11/14/16 0459 11/15/16 0437 11/16/16 0556  11/19/16 0715 11/22/16 0830 11/24/16 1822  NA 125*  < > 123* 126* 129* 129*  < > 130* 132* 129*  K 3.6  < >  3.5 3.9 4.2 4.2  < > 4.2 3.8 3.8  CL 78*  < > 78* 82* 88* 88*  < > 86* 90* 90*  CO2 36*  < > 35* 30 32 35*  < > 34* 33* 32  GLUCOSE 111*  < > 131* 115* 103* 126*  < > 123* 97 140*  BUN 12  < > 13 12 12 13   < > 17 15 20   CREATININE 0.57  < > 0.56 0.56 0.66 0.63  < > 0.60 0.65 0.71  CALCIUM 7.9*  < > 7.8* 8.0* 7.8* 8.2*  < > 8.6* 8.1* 7.7*  MG 1.2*  --  1.3*  --  1.8  --   --   --   --   --   PHOS  --   --   --  2.6  --  3.4  --   --   --   --   < > = values in this interval not displayed.  Recent Labs  11/17/16 0056 11/22/16 0830 11/24/16 1822  AST 25 27 29   ALT 20 19 17   ALKPHOS 53 52 64  BILITOT 0.9 1.3* 1.0  PROT 6.8 6.2* 6.4*  ALBUMIN 2.5* 2.3* 2.2*    Recent Labs  11/17/16 0056 11/19/16 0715 11/22/16 0830  WBC 11.3* 12.9* 12.5*  NEUTROABS 9.1* 11.2* 10.8*  HGB 14.0 15.1* 13.3  HCT 42.7 45.3 40.7  MCV 92.0 91.1 91.9  PLT 389 398 372   Lab Results  Component Value Date   TSH 1.457 11/24/2016   No results found for: HGBA1C No results found for: CHOL, HDL, LDLCALC, LDLDIRECT, TRIG, CHOLHDL  Significant Diagnostic Results in last 30 days:  Dg  Chest 2 View  Result Date: 11/17/2016 CLINICAL DATA:  Recent positive test for flu, shortness of breath EXAM: CHEST  2 VIEW COMPARISON:  11/10/2016 FINDINGS: Stable cardiomegaly and tiny effusions. No interval consolidation. No pneumothorax. Mild linear atelectasis in the left upper lobe. IMPRESSION: Cardiomegaly with tiny effusions. No interval development of acute focal pulmonary infiltrate Electronically Signed   By: Jasmine PangKim  Fujinaga M.D.   On: 11/17/2016 01:39   Dg Chest 2 View  Result Date: 11/10/2016 CLINICAL DATA:  Heart failure.  Shortness of breath. EXAM: CHEST  2 VIEW COMPARISON:  November 07, 2014 FINDINGS: Cardiomegaly with mild pulmonary venous congestion. Probable tiny effusions. No significant interval change. IMPRESSION: Cardiomegaly with mild pulmonary venous congestion and tiny effusions. Electronically Signed   By: Gerome Samavid  Williams III M.D   On: 11/10/2016 12:47   Dg Chest 2 View  Result Date: 11/07/2016 CLINICAL DATA:  Short of breath and chest pain EXAM: CHEST  2 VIEW COMPARISON:  11/01/2016 FINDINGS: Cardiac enlargement with mild vascular congestion. Negative for edema or effusion. Mild left lower lobe atelectasis. Thoracic degenerative changes. IMPRESSION: Cardiac enlargement with mild vascular congestion. Mild left lower lobe atelectasis Electronically Signed   By: Marlan Palauharles  Clark M.D.   On: 11/07/2016 15:31   Dg Chest Portable 1 View  Result Date: 11/01/2016 CLINICAL DATA:  Generalized weakness. New onset atrial fibrillation. EXAM: PORTABLE CHEST 1 VIEW COMPARISON:  None. FINDINGS: Study is limited by the patient's size and portable technique. There is cardiomegaly and pulmonary vascular congestion. Mild left basilar atelectasis is noted. No pneumothorax or pleural effusion. IMPRESSION: Cardiomegaly and pulmonary vascular congestion. Electronically Signed   By: Drusilla Kannerhomas  Dalessio M.D.   On: 11/01/2016 14:14    Assessment/Plan  #1 left base infiltrate-she has been started on  Levaquin to complete  a course on February 24-she is also receiving nebulizers every 6 hours-will add Mucinex 600 mg twice a day as well--for 7 days-vital signs appear to be stable O2 saturation is stable but this will have to be watched.  #2 history of right arm edema will order a venous Doppler to rule out any DVT this does not appear to be painful warm or erythematous but would like to rule out any chance of DVT.  #3 history of diastolic CHF she is on Lasix with potassium supplementation weights have been relatively stable since admission but this will have to be watched again x-ray did not really show any effusions it did show a left base infiltrate.--Did order a BNP over the weekend but apparently there were complications obtaining adequate blood sample will try to re-obtain this tomorrow  #4 hyponatremia she is on sodium tablets-will update her sodium level tomorrow it was 129 over the weekend she continues on sodium tablets  #5 leukocytosis WBCs were 12,500 on most recent lab will update this see where we stand.   [id number]      London Sheer, CMA [phone]

## 2016-11-27 ENCOUNTER — Encounter (HOSPITAL_COMMUNITY)
Admission: RE | Admit: 2016-11-27 | Discharge: 2016-11-27 | Disposition: A | Discharge status: Home / Self Care | Payer: Medicare HMO | Source: Skilled Nursing Facility | Attending: Internal Medicine | Admitting: Internal Medicine

## 2016-11-27 DIAGNOSIS — J111 Influenza due to unidentified influenza virus with other respiratory manifestations: Secondary | ICD-10-CM | POA: Insufficient documentation

## 2016-11-27 DIAGNOSIS — I4891 Unspecified atrial fibrillation: Secondary | ICD-10-CM | POA: Insufficient documentation

## 2016-11-27 DIAGNOSIS — E039 Hypothyroidism, unspecified: Secondary | ICD-10-CM | POA: Insufficient documentation

## 2016-11-27 DIAGNOSIS — E871 Hypo-osmolality and hyponatremia: Secondary | ICD-10-CM | POA: Insufficient documentation

## 2016-11-27 DIAGNOSIS — I10 Essential (primary) hypertension: Secondary | ICD-10-CM | POA: Insufficient documentation

## 2016-11-27 LAB — CBC WITH DIFFERENTIAL/PLATELET
BASOS ABS: 0 10*3/uL (ref 0.0–0.1)
Basophils Relative: 0 %
EOS PCT: 4 %
Eosinophils Absolute: 0.4 10*3/uL (ref 0.0–0.7)
HCT: 38.6 % (ref 36.0–46.0)
Hemoglobin: 12.6 g/dL (ref 12.0–15.0)
LYMPHS PCT: 8 %
Lymphs Abs: 0.8 10*3/uL (ref 0.7–4.0)
MCH: 30.2 pg (ref 26.0–34.0)
MCHC: 32.6 g/dL (ref 30.0–36.0)
MCV: 92.6 fL (ref 78.0–100.0)
MONO ABS: 0.6 10*3/uL (ref 0.1–1.0)
Monocytes Relative: 6 %
Neutro Abs: 8.3 10*3/uL — ABNORMAL HIGH (ref 1.7–7.7)
Neutrophils Relative %: 82 %
PLATELETS: 328 10*3/uL (ref 150–400)
RBC: 4.17 MIL/uL (ref 3.87–5.11)
RDW: 13.9 % (ref 11.5–15.5)
WBC: 10.1 10*3/uL (ref 4.0–10.5)

## 2016-11-27 LAB — BASIC METABOLIC PANEL
ANION GAP: 8 (ref 5–15)
BUN: 21 mg/dL — AB (ref 6–20)
CALCIUM: 7.8 mg/dL — AB (ref 8.9–10.3)
CO2: 33 mmol/L — ABNORMAL HIGH (ref 22–32)
CREATININE: 0.63 mg/dL (ref 0.44–1.00)
Chloride: 91 mmol/L — ABNORMAL LOW (ref 101–111)
GFR calc Af Amer: >60 mL/min (ref >60)
GLUCOSE: 105 mg/dL — AB (ref 65–99)
Potassium: 3.7 mmol/L (ref 3.5–5.1)
Sodium: 132 mmol/L — ABNORMAL LOW (ref 135–145)

## 2016-11-27 LAB — BRAIN NATRIURETIC PEPTIDE: B NATRIURETIC PEPTIDE 5: 258 pg/mL — AB (ref 0.0–100.0)

## 2016-11-28 ENCOUNTER — Encounter: Payer: Self-pay | Admitting: Internal Medicine

## 2016-11-28 ENCOUNTER — Emergency Department (HOSPITAL_COMMUNITY): Payer: Medicare HMO

## 2016-11-28 ENCOUNTER — Encounter (HOSPITAL_COMMUNITY): Payer: Self-pay | Admitting: Emergency Medicine

## 2016-11-28 ENCOUNTER — Inpatient Hospital Stay (HOSPITAL_COMMUNITY)
Admission: EM | Admit: 2016-11-28 | Discharge: 2016-12-06 | DRG: 291 | Disposition: A | Discharge status: Skilled Nursing Facility | Payer: Medicare HMO | Attending: Family Medicine | Admitting: Family Medicine

## 2016-11-28 ENCOUNTER — Non-Acute Institutional Stay (SKILLED_NURSING_FACILITY): Payer: Medicare HMO | Admitting: Internal Medicine

## 2016-11-28 DIAGNOSIS — I110 Hypertensive heart disease with heart failure: Secondary | ICD-10-CM | POA: Diagnosis not present

## 2016-11-28 DIAGNOSIS — I5031 Acute diastolic (congestive) heart failure: Secondary | ICD-10-CM | POA: Diagnosis not present

## 2016-11-28 DIAGNOSIS — R531 Weakness: Secondary | ICD-10-CM

## 2016-11-28 DIAGNOSIS — E038 Other specified hypothyroidism: Secondary | ICD-10-CM | POA: Diagnosis not present

## 2016-11-28 DIAGNOSIS — I480 Paroxysmal atrial fibrillation: Secondary | ICD-10-CM | POA: Diagnosis not present

## 2016-11-28 DIAGNOSIS — E669 Obesity, unspecified: Secondary | ICD-10-CM | POA: Diagnosis present

## 2016-11-28 DIAGNOSIS — M7989 Other specified soft tissue disorders: Secondary | ICD-10-CM | POA: Diagnosis not present

## 2016-11-28 DIAGNOSIS — Z7982 Long term (current) use of aspirin: Secondary | ICD-10-CM

## 2016-11-28 DIAGNOSIS — I959 Hypotension, unspecified: Secondary | ICD-10-CM | POA: Diagnosis present

## 2016-11-28 DIAGNOSIS — E785 Hyperlipidemia, unspecified: Secondary | ICD-10-CM | POA: Diagnosis present

## 2016-11-28 DIAGNOSIS — Z7901 Long term (current) use of anticoagulants: Secondary | ICD-10-CM

## 2016-11-28 DIAGNOSIS — Y95 Nosocomial condition: Secondary | ICD-10-CM | POA: Diagnosis present

## 2016-11-28 DIAGNOSIS — I5033 Acute on chronic diastolic (congestive) heart failure: Secondary | ICD-10-CM | POA: Diagnosis present

## 2016-11-28 DIAGNOSIS — E871 Hypo-osmolality and hyponatremia: Secondary | ICD-10-CM | POA: Diagnosis present

## 2016-11-28 DIAGNOSIS — I10 Essential (primary) hypertension: Secondary | ICD-10-CM

## 2016-11-28 DIAGNOSIS — I4891 Unspecified atrial fibrillation: Secondary | ICD-10-CM | POA: Diagnosis not present

## 2016-11-28 DIAGNOSIS — J690 Pneumonitis due to inhalation of food and vomit: Secondary | ICD-10-CM | POA: Diagnosis present

## 2016-11-28 DIAGNOSIS — E039 Hypothyroidism, unspecified: Secondary | ICD-10-CM | POA: Diagnosis present

## 2016-11-28 DIAGNOSIS — I481 Persistent atrial fibrillation: Secondary | ICD-10-CM | POA: Diagnosis present

## 2016-11-28 DIAGNOSIS — Z79899 Other long term (current) drug therapy: Secondary | ICD-10-CM

## 2016-11-28 DIAGNOSIS — Z6833 Body mass index (BMI) 33.0-33.9, adult: Secondary | ICD-10-CM

## 2016-11-28 LAB — CBC WITH DIFFERENTIAL/PLATELET
BASOS ABS: 0 10*3/uL (ref 0.0–0.1)
Basophils Relative: 0 %
EOS PCT: 3 %
Eosinophils Absolute: 0.3 10*3/uL (ref 0.0–0.7)
HEMATOCRIT: 40.5 % (ref 36.0–46.0)
HEMOGLOBIN: 12.9 g/dL (ref 12.0–15.0)
LYMPHS ABS: 1 10*3/uL (ref 0.7–4.0)
LYMPHS PCT: 10 %
MCH: 29.9 pg (ref 26.0–34.0)
MCHC: 31.9 g/dL (ref 30.0–36.0)
MCV: 94 fL (ref 78.0–100.0)
Monocytes Absolute: 0.5 10*3/uL (ref 0.1–1.0)
Monocytes Relative: 4 %
NEUTROS ABS: 9 10*3/uL — AB (ref 1.7–7.7)
NEUTROS PCT: 83 %
PLATELETS: 375 10*3/uL (ref 150–400)
RBC: 4.31 MIL/uL (ref 3.87–5.11)
RDW: 13.9 % (ref 11.5–15.5)
WBC: 10.8 10*3/uL — AB (ref 4.0–10.5)

## 2016-11-28 LAB — COMPREHENSIVE METABOLIC PANEL
ALT: 21 U/L (ref 14–54)
ANION GAP: 9 (ref 5–15)
AST: 32 U/L (ref 15–41)
Albumin: 2.1 g/dL — ABNORMAL LOW (ref 3.5–5.0)
Alkaline Phosphatase: 55 U/L (ref 38–126)
BILIRUBIN TOTAL: 0.7 mg/dL (ref 0.3–1.2)
BUN: 23 mg/dL — ABNORMAL HIGH (ref 6–20)
CHLORIDE: 91 mmol/L — AB (ref 101–111)
CO2: 33 mmol/L — ABNORMAL HIGH (ref 22–32)
Calcium: 8.1 mg/dL — ABNORMAL LOW (ref 8.9–10.3)
Creatinine, Ser: 0.71 mg/dL (ref 0.44–1.00)
GFR calc Af Amer: >60 mL/min (ref >60)
Glucose, Bld: 135 mg/dL — ABNORMAL HIGH (ref 65–99)
POTASSIUM: 4 mmol/L (ref 3.5–5.1)
Sodium: 133 mmol/L — ABNORMAL LOW (ref 135–145)
TOTAL PROTEIN: 6.7 g/dL (ref 6.5–8.1)

## 2016-11-28 LAB — LACTIC ACID, PLASMA: Lactic Acid, Venous: 2.1 mmol/L (ref 0.5–1.9)

## 2016-11-28 LAB — PROCALCITONIN

## 2016-11-28 LAB — TROPONIN I

## 2016-11-28 LAB — BRAIN NATRIURETIC PEPTIDE: B Natriuretic Peptide: 240 pg/mL — ABNORMAL HIGH (ref 0.0–100.0)

## 2016-11-28 MED ORDER — ONDANSETRON HCL 4 MG/2ML IJ SOLN: 4.0000 mg | Freq: Four times a day (QID) | INTRAMUSCULAR | Status: DC | PRN

## 2016-11-28 MED ORDER — IPRATROPIUM-ALBUTEROL 0.5-2.5 (3) MG/3ML IN SOLN: 3.0000 mL | RESPIRATORY_TRACT | Status: DC | PRN

## 2016-11-28 MED ORDER — GUAIFENESIN ER 600 MG PO TB12
600.0000 mg | ORAL_TABLET | Freq: Two times a day (BID) | ORAL | Status: DC
  Administered 2016-11-28 – 2016-12-06 (×16): 600 mg via ORAL
  Filled 2016-11-28 (×16): qty 1

## 2016-11-28 MED ORDER — DEXTROSE 5 % IV SOLN
5.0000 mg/h | INTRAVENOUS | Status: DC
  Administered 2016-11-29: 10 mg/h via INTRAVENOUS

## 2016-11-28 MED ORDER — LEVOTHYROXINE SODIUM 50 MCG PO TABS
50.0000 ug | ORAL_TABLET | Freq: Every day | ORAL | Status: DC
  Administered 2016-11-29 – 2016-12-06 (×8): 50 ug via ORAL
  Filled 2016-11-28: qty 2
  Filled 2016-11-28: qty 1
  Filled 2016-11-28 (×3): qty 2
  Filled 2016-11-28: qty 1
  Filled 2016-11-28 (×2): qty 2

## 2016-11-28 MED ORDER — CEFEPIME HCL 2 G IJ SOLR
2.0000 g | Freq: Three times a day (TID) | INTRAMUSCULAR | Status: DC
  Administered 2016-11-28 – 2016-11-29 (×2): 2 g via INTRAVENOUS
  Filled 2016-11-28 (×5): qty 2

## 2016-11-28 MED ORDER — PIPERACILLIN-TAZOBACTAM 3.375 G IVPB
3.3750 g | Freq: Once | INTRAVENOUS | Status: AC
  Administered 2016-11-28: 3.375 g via INTRAVENOUS
  Filled 2016-11-28: qty 50

## 2016-11-28 MED ORDER — SODIUM CHLORIDE 0.9 % IV BOLUS (SEPSIS)
250.0000 mL | Freq: Once | INTRAVENOUS | Status: AC
  Administered 2016-11-28: 250 mL via INTRAVENOUS

## 2016-11-28 MED ORDER — AMLODIPINE BESYLATE 5 MG PO TABS: 5.0000 mg | ORAL_TABLET | Freq: Every day | ORAL | Status: DC

## 2016-11-28 MED ORDER — DEXTROSE 5 % IV SOLN
INTRAVENOUS | Status: AC
  Filled 2016-11-28: qty 2

## 2016-11-28 MED ORDER — METOPROLOL TARTRATE 25 MG PO TABS: 25.0000 mg | ORAL_TABLET | Freq: Two times a day (BID) | ORAL | Status: DC

## 2016-11-28 MED ORDER — DEXTROSE 5 % IV SOLN
5.0000 mg/h | Freq: Once | INTRAVENOUS | Status: AC
  Administered 2016-11-28: 5 mg/h via INTRAVENOUS
  Filled 2016-11-28: qty 100

## 2016-11-28 MED ORDER — RISAQUAD PO CAPS
1.0000 | ORAL_CAPSULE | Freq: Every day | ORAL | Status: DC
  Administered 2016-11-29 – 2016-12-06 (×7): 1 via ORAL
  Filled 2016-11-28 (×9): qty 1

## 2016-11-28 MED ORDER — FUROSEMIDE 10 MG/ML IJ SOLN
20.0000 mg | Freq: Two times a day (BID) | INTRAMUSCULAR | Status: DC
  Administered 2016-11-29 – 2016-12-02 (×7): 20 mg via INTRAVENOUS
  Filled 2016-11-28 (×7): qty 2

## 2016-11-28 MED ORDER — SODIUM CHLORIDE 1 G PO TABS
1.0000 g | ORAL_TABLET | Freq: Two times a day (BID) | ORAL | Status: DC
  Administered 2016-11-29 – 2016-12-06 (×15): 1 g via ORAL
  Filled 2016-11-28 (×19): qty 1

## 2016-11-28 MED ORDER — APIXABAN 5 MG PO TABS
5.0000 mg | ORAL_TABLET | Freq: Two times a day (BID) | ORAL | Status: DC
  Administered 2016-11-28 – 2016-12-06 (×16): 5 mg via ORAL
  Filled 2016-11-28 (×20): qty 1

## 2016-11-28 MED ORDER — METHYLDOPA 250 MG PO TABS
250.0000 mg | ORAL_TABLET | Freq: Every day | ORAL | Status: DC
  Filled 2016-11-28 (×2): qty 1

## 2016-11-28 MED ORDER — VANCOMYCIN HCL IN DEXTROSE 1-5 GM/200ML-% IV SOLN
1000.0000 mg | Freq: Once | INTRAVENOUS | Status: AC
  Administered 2016-11-28: 1000 mg via INTRAVENOUS
  Filled 2016-11-28: qty 200

## 2016-11-28 MED ORDER — FUROSEMIDE 10 MG/ML IJ SOLN
20.0000 mg | Freq: Once | INTRAMUSCULAR | Status: AC
  Administered 2016-11-28: 20 mg via INTRAVENOUS
  Filled 2016-11-28: qty 2

## 2016-11-28 MED ORDER — POTASSIUM CHLORIDE CRYS ER 10 MEQ PO TBCR
10.0000 meq | EXTENDED_RELEASE_TABLET | Freq: Every day | ORAL | Status: DC
  Administered 2016-11-29 – 2016-12-06 (×8): 10 meq via ORAL
  Filled 2016-11-28 (×16): qty 1

## 2016-11-28 MED ORDER — ACETAMINOPHEN 325 MG PO TABS: 650.0000 mg | ORAL_TABLET | ORAL | Status: DC | PRN

## 2016-11-28 MED ORDER — OMEGA-3-ACID ETHYL ESTERS 1 G PO CAPS
1.0000 g | ORAL_CAPSULE | Freq: Every day | ORAL | Status: DC
  Administered 2016-11-29 – 2016-12-06 (×7): 1 g via ORAL
  Filled 2016-11-28 (×12): qty 1

## 2016-11-28 MED ORDER — VANCOMYCIN HCL IN DEXTROSE 750-5 MG/150ML-% IV SOLN
750.0000 mg | Freq: Two times a day (BID) | INTRAVENOUS | Status: DC
  Administered 2016-11-29: 750 mg via INTRAVENOUS
  Filled 2016-11-28 (×3): qty 150

## 2016-11-28 MED ORDER — RISA-BID PROBIOTIC PO TABS: 1.0000 | ORAL_TABLET | Freq: Two times a day (BID) | ORAL | Status: DC

## 2016-11-28 NOTE — ED Triage Notes (Signed)
Per [facility] pt has had right side edema, shortness of breath, and tachycardia x 2 days.  Pt edema is pitting in the right upper extremity.  Pt also has a cough.

## 2016-11-28 NOTE — Progress Notes (Signed)
Location:   [facility] Room Number: 126/P Place of Service:  SNF [phone]) Provider:  Adah Perl, PA-C  Patient Care Team: Phyllis Ginger as PCP - General (Physician Assistant)  Extended Emergency Contact Information Primary Emergency Contact: Pilar Grammes [place] of [place] Home Phone: [phone] Mobile Phone: [phone] Relation: Daughter Secondary Emergency Contact: Fransisco Hertz [place] of [place] Home Phone: [phone] Relation: Son  Code Status:  Full Code Goals of care: Advanced Directive information Advanced Directives 11/28/2016  Does Patient Have a Medical Advance Directive? Yes  Type of Advance Directive (No Data)  Does patient want to make changes to medical advance directive? No - Patient declined  Would patient like information on creating a medical advance directive? No - Patient declined     Chief Complaint  Patient presents with  . Acute Visit    Elevated heart rate,Rt. arm edema, decreased B/P    HPI:  Pt is a 83 y.o. female seen today for an acute visit for For complaints of lethargy-apparently patient has had lethargy here now for several days-with a poor appetite although apparently she does better when family is feeding her.  She is also being treated for suspected pneumonia with a left base infiltrate she is receiving Levaquin.  She also has edema of her right arm that apparently is been present here for a while venous Doppler was ordered but Doppler service is still yet to arrive at this point.  She does have a recent hospitalization for new onset A. fib as well as diastolic CHF-she is on  Lopressor 25 mg twice a day-she is on Eliquis for anticoagulation.  Nursing staff is concerned about lethargy which apparently is been present for several days with poor appetite again when her family is here apparently she does eat better.  We did order labs yesterday which actually appear to be  stable she does have some history of hyponatremia which was frozen the hospital as well but it was 132 on lab yesterday which actually somewhat improved.--Her white count also came down to 10,100.  BNP was mildly elevated at 258.  She is not complaining of any chest pain or acute shortness of breath-her pulse at one point today was noted to be 150 per review of last few days that appears she has had an elevated pulse rate at times I see 1:30 0 listed I also see 69.  She is receiving duo nebs when necessary-which may contribute  to this however today per serial exams even after the nebulizers were applied a while ago her pulse rate continued to be elevated in the 120-130  area at times-blood pressure also was noted to be low with a systolic of 99 she is not really complaining of any syncope or dizziness.        Past Medical History:  Diagnosis Date  . Atrial fibrillation (HCC)   . Essential hypertension   . Hypothyroidism   . Lumbago   . Neuralgia   . Neuritis    Past Surgical History:  Procedure Laterality Date  . ABDOMINAL HYSTERECTOMY    . APPENDECTOMY      No Known Allergies  Current Outpatient Prescriptions on File Prior to Visit  Medication Sig Dispense Refill  . acetaminophen (TYLENOL) 325 MG tablet Take 650 mg by mouth every 6 (six) hours as needed.    Marland Kitchen amLODipine (NORVASC) 5 MG tablet Take 1 tablet (5 mg total) by mouth daily. 10 tablet 0  .  apixaban (ELIQUIS) 5 MG TABS tablet Take 1 tablet (5 mg total) by mouth 2 (two) times daily. 60 tablet 2  . furosemide (LASIX) 40 MG tablet Take 1 tablet (40 mg total) by mouth daily. 30 tablet 2  . ipratropium (ATROVENT) 0.02 % nebulizer solution Take 0.5 mg by nebulization every 6 (six) hours as needed for wheezing or shortness of breath.    . levofloxacin (LEVAQUIN) 250 MG tablet Take 250 mg by mouth daily.    Marland Kitchen levothyroxine (SYNTHROID, LEVOTHROID) 50 MCG tablet Take 50 mcg by mouth daily before breakfast.    . methyldopa  (ALDOMET) 250 MG tablet Take 250 mg by mouth daily.    . metoprolol tartrate (LOPRESSOR) 25 MG tablet Take 1 tablet (25 mg total) by mouth 2 (two) times daily. 60 tablet 2  . Omega-3 Fatty Acids (FISH OIL) 1000 MG CAPS Take 1 capsule by mouth daily.    . OXYGEN Inhale 2 L into the lungs daily as needed. To keep sats greater or equal to 90%    . potassium chloride (K-DUR) 10 MEQ tablet Take 10 mEq by mouth daily.    . Probiotic Product (RISA-BID PROBIOTIC) TABS Take 1 tablet by mouth twice a day    . sodium chloride 1 g tablet Take 1 tablet (1 g total) by mouth 2 (two) times daily with a meal. 30 tablet 0   No current facility-administered medications on file prior to visit.     Review of Systems In general she is not really complaining of any pain shortness of breath or chest pain per nursing staff she appears to be increasingly lethargic although she appeared to be somewhat more responsive when I was in the room and family was in.  Skin does not complain of rashes or itching does have edema of her right arm.  Head ears eyes nose mouth and throat not complaining of visual changes sore throat.  Respiratory is not complaining of shortness of breath at this time has had a cough is being treated for a lung infiltrate.  Cardiac is not complaining of chest pain currently does have-relation rate appears to be elevated.  GI is not complaining of any abdominal discomfort or dysphagia.  GU is not really complaining of dysuria.  Muscle skeletal has weakness but is not really complaining of joint pain.  Neurologic is not complaining of syncope at times will complain of dizziness per patient.  Psych does not really complain of depression or anxiety but she has been somewhat lethargic with a somewhat flat mood.   or  There is no immunization history on file for this patient. Pertinent  Health Maintenance Due  Topic Date Due  . DEXA SCAN  12/29/1995  . PNA vac Low Risk Adult (1 of 2 - PCV13)  12/29/1995  . INFLUENZA VACCINE  05/08/2016   No flowsheet data found. Functional Status Survey:    Vitals:   11/28/16 1510  BP: 99/62  Pulse: (!) 112  Resp: 20  SpO2: 94%  Of note apical pulse was between 120 and 1:30  at times it would go bit lower than go back up highest was around 150   Weight is 234 this up about 4 pounds over the past week  Physical Exam   In general this is a obese belly female in no acute distress impaired initially somewhat lethargic but the more we examined her she became more alert talkative even a little irritated with the exam it appears.  Her skin is  warm and dry she has some scattered bruising most probably of her arms-also has increased edema of her right arm that is cool to touch nonerythematous but she does have some bruising here.  Oropharynx clear mucous membranes moist.  Chest she continues to have reduced air entry initially had some congestion that appear to clear with cough does not really use her accessory muscles.  Heart is irregular irregular rate and rhythm with variable pulses as noted above she has I would say 2+ lower extremity edema her legs are currently elevated in bed.  Her abdomen is obese soft does not appear to be acutely tender there are positive bowel sounds.  Muscle skeletal continues with significant lower extremity weakness as well as the edema of her right arm.  Neurologic appears grossly intact her speech is clear she is alert now.  Psych she appears alert again a little irritated with the exam   Labs reviewed:  Recent Labs  11/11/16 0652  11/13/16 0550 11/14/16 0459 11/15/16 0437 11/16/16 0556  11/22/16 0830 11/24/16 1822 11/27/16 0730  NA 125*  < > 123* 126* 129* 129*  < > 132* 129* 132*  K 3.6  < > 3.5 3.9 4.2 4.2  < > 3.8 3.8 3.7  CL 78*  < > 78* 82* 88* 88*  < > 90* 90* 91*  CO2 36*  < > 35* 30 32 35*  < > 33* 32 33*  GLUCOSE 111*  < > 131* 115* 103* 126*  < > 97 140* 105*  BUN 12  < > 13 12 12 13    < > 15 20 21*  CREATININE 0.57  < > 0.56 0.56 0.66 0.63  < > 0.65 0.71 0.63  CALCIUM 7.9*  < > 7.8* 8.0* 7.8* 8.2*  < > 8.1* 7.7* 7.8*  MG 1.2*  --  1.3*  --  1.8  --   --   --   --   --   PHOS  --   --   --  2.6  --  3.4  --   --   --   --   < > = values in this interval not displayed.  Recent Labs  11/17/16 0056 11/22/16 0830 11/24/16 1822  AST 25 27 29   ALT 20 19 17   ALKPHOS 53 52 64  BILITOT 0.9 1.3* 1.0  PROT 6.8 6.2* 6.4*  ALBUMIN 2.5* 2.3* 2.2*    Recent Labs  11/19/16 0715 11/22/16 0830 11/27/16 0730  WBC 12.9* 12.5* 10.1  NEUTROABS 11.2* 10.8* 8.3*  HGB 15.1* 13.3 12.6  HCT 45.3 40.7 38.6  MCV 91.1 91.9 92.6  PLT 398 372 328   Lab Results  Component Value Date   TSH 1.457 11/24/2016   No results found for: HGBA1C No results found for: CHOL, HDL, LDLCALC, LDLDIRECT, TRIG, CHOLHDL  Significant Diagnostic Results in last 30 days:  Dg Chest 2 View  Result Date: 11/17/2016 CLINICAL DATA:  Recent positive test for flu, shortness of breath EXAM: CHEST  2 VIEW COMPARISON:  11/10/2016 FINDINGS: Stable cardiomegaly and tiny effusions. No interval consolidation. No pneumothorax. Mild linear atelectasis in the left upper lobe. IMPRESSION: Cardiomegaly with tiny effusions. No interval development of acute focal pulmonary infiltrate Electronically Signed   By: Jasmine Pang M.D.   On: 11/17/2016 01:39   Dg Chest 2 View  Result Date: 11/10/2016 CLINICAL DATA:  Heart failure.  Shortness of breath. EXAM: CHEST  2 VIEW COMPARISON:  November 07, 2014 FINDINGS: Cardiomegaly  with mild pulmonary venous congestion. Probable tiny effusions. No significant interval change. IMPRESSION: Cardiomegaly with mild pulmonary venous congestion and tiny effusions. Electronically Signed   By: Gerome Samavid  Williams III M.D   On: 11/10/2016 12:47   Dg Chest 2 View  Result Date: 11/07/2016 CLINICAL DATA:  Short of breath and chest pain EXAM: CHEST  2 VIEW COMPARISON:  11/01/2016 FINDINGS: Cardiac  enlargement with mild vascular congestion. Negative for edema or effusion. Mild left lower lobe atelectasis. Thoracic degenerative changes. IMPRESSION: Cardiac enlargement with mild vascular congestion. Mild left lower lobe atelectasis Electronically Signed   By: Marlan Palauharles  Clark M.D.   On: 11/07/2016 15:31   Dg Chest Portable 1 View  Result Date: 11/01/2016 CLINICAL DATA:  Generalized weakness. New onset atrial fibrillation. EXAM: PORTABLE CHEST 1 VIEW COMPARISON:  None. FINDINGS: Study is limited by the patient's size and portable technique. There is cardiomegaly and pulmonary vascular congestion. Mild left basilar atelectasis is noted. No pneumothorax or pleural effusion. IMPRESSION: Cardiomegaly and pulmonary vascular congestion. Electronically Signed   By: Drusilla Kannerhomas  Dalessio M.D.   On: 11/01/2016 14:14    Assessment/Plan   #1 history of-relation she currently on Lopressor 25 mg twice a day heart rate appears to be elevated today was at one point up to 150 and then moderates-she is on Eliquis for anticoagulation she does not appear to be in any distress but I am concerned about the rate control as well as the blood pressure with systolic less than 100 which would call some concern about increasing Lopressor-will await ER evaluation.  #2 history of right arm edema nursing staff has spoken with the ER and apparently they will be able to a Doppler of the right arm while she is there well out any DVT or acute process again she is on Eliquis for anticoagulation area  #3 hyponatremia she is on sodium tablets this actually appears improved on lab done yesterday at 132.  #4 history of diastolic CHF she has had some weight gain it appears she's had some increased edema BNP was not grossly elevated on lab done yesterday she is not really complaining of shortness of breath but this will have to be watched as well she is on Lasix 40 mg a day.  #6-hypertension-appears recent systolics have been in the lower 100s  up to the 140s somewhat depressed today again concerning as well as with her elevated heart rate-she is on Norvasc 5 mg a day Lopressor 25 mg twice a day as well as Methyldopa  250 mg a day   Again main concern today is the elevated heart rate somewhat low blood pressure with her history of atrial fibrillation also will need a Doppler of her right arm.  This is  Complicated , with a history of diastolic CHF as well as hyponatremia    [id number] of note greater than 35 minutes spent assessing patient-reassessing patient-discussing her status with nursing staff -reviewing her chart-her labs-as well as discussion with her family at bedside-of note greater than 50% of time spent coordinating plan of care      London SheerLuster, Sally C, [place]MA [phone]

## 2016-11-28 NOTE — ED Notes (Signed)
Lab at bedside to draw blood cultures,  

## 2016-11-28 NOTE — ED Triage Notes (Signed)
Pt comes in from [facility] for sob and tachycardia for several days (no exact number given). She began having right arm swelling several days ago as well. NAD noted. Pt alert upon triage.

## 2016-11-28 NOTE — H&P (Signed)
History and Physical    Yvonne Chan ONG:[id number] DOB: [birth year] DOA: 11/28/2016  Referring MD/NP/PA: Dr. Judd Chan PCP: Yvonne Richard, PA-C  Patient coming from: [facility]  Chief Complaint: Elevated heart rate  HPI: Yvonne Chan is a 83 y.o. female with medical history significant of HTN, PAF on Eliquis, diastolic CHF, and hypothyroidism; who presents from the [facility] for a fast irregular heartbeat noticed this afternoon by staff. According to family present at bedside she has had increasing swelling in her right arms and bilateral legs. Patient complains of feeling short of breath. Review of records shows that the patient has been hospitalized multiple times within the last month1/25-1/30, 1/31-2/9, and then 2/9-2/10. Treated for atrial fibrillation, diastolic CHF, influenza, and community-acquired pneumonia. Other associated symptoms include decreased appetite and generalized weakness. Patient denies any chest pain, dysuria, nausea, vomiting, or abdominal pain. Review of records shows the patient weight back on 2/6 was 224 pounds.   ED Course: Upon admission into the emergency department patient was seen to be afebrile, pulse 120-140s, respirations 20-34, blood pressure 96/71-131/80, and O2 saturations maintained at room air. Lab work revealed WBC 10.8, sodium 133, chloride 91, CO2 33, BUN 23, creatinine 0.71, calcium 8.1, glucose 135, troponin 0.03, and BNP 240. Due to the patient's elevated heart rates she was placed on a diltiazem drip. TRH called to admit. Recommended empiric antibiotics of vancomycin and Zosyn for possibility of a healthcare associated pneumonia.  Review of Systems: As per HPI otherwise 10 point review of systems negative.   Past Medical History:  Diagnosis Date  . Atrial fibrillation (HCC)   . Essential hypertension   . Hypothyroidism   . Lumbago   . Neuralgia   . Neuritis     Past Surgical History:  Procedure Laterality Date  . ABDOMINAL HYSTERECTOMY    .  APPENDECTOMY       reports that she has never smoked. She has never used smokeless tobacco. She reports that she does not drink alcohol or use drugs.  No Known Allergies  No family history on file.  Prior to Admission medications   Medication Sig Start Date End Date Taking? Authorizing Provider  acetaminophen (TYLENOL) 325 MG tablet Take 650 mg by mouth every 6 (six) hours as needed.   Yes Historical Provider, MD  amLODipine (NORVASC) 5 MG tablet Take 1 tablet (5 mg total) by mouth daily. 11/17/16  Yes Filbert Schilder, MD  apixaban (ELIQUIS) 5 MG TABS tablet Take 1 tablet (5 mg total) by mouth 2 (two) times daily. 11/06/16  Yes Estela Isaiah Blakes, MD  furosemide (LASIX) 40 MG tablet Take 1 tablet (40 mg total) by mouth daily. 11/07/16  Yes Estela Isaiah Blakes, MD  ipratropium-albuterol (DUONEB) 0.5-2.5 (3) MG/3ML SOLN Take 3 mLs by nebulization every 6 (six) hours as needed (shortness of breath).   Yes Historical Provider, MD  levofloxacin (LEVAQUIN) 250 MG tablet Take 250 mg by mouth daily.   Yes Historical Provider, MD  levothyroxine (SYNTHROID, LEVOTHROID) 50 MCG tablet Take 50 mcg by mouth daily before breakfast.   Yes Historical Provider, MD  methyldopa (ALDOMET) 250 MG tablet Take 250 mg by mouth daily.   Yes Historical Provider, MD  metoprolol tartrate (LOPRESSOR) 25 MG tablet Take 1 tablet (25 mg total) by mouth 2 (two) times daily. 11/06/16  Yes Estela Isaiah Blakes, MD  Omega-3 Fatty Acids (FISH OIL) 1000 MG CAPS Take 1 capsule by mouth daily.   Yes Historical Provider, MD  potassium  chloride (K-DUR) 10 MEQ tablet Take 10 mEq by mouth daily.   Yes Historical Provider, MD  Probiotic Product (RISA-BID PROBIOTIC) TABS Take 1 tablet by mouth twice a day   Yes Historical Provider, MD  sodium chloride 1 g tablet Take 1 tablet (1 g total) by mouth 2 (two) times daily with a meal. 11/16/16  Yes Filbert Schilder, MD  OXYGEN Inhale 2 L into the lungs daily as needed. To  keep sats greater or equal to 90%    Historical Provider, MD    Physical Exam:    Constitutional: Elderly female who appears sick, but able to follow commands. Vitals:   11/28/16 1556 11/28/16 1603  BP: 98/57   Pulse: 64   Resp: 26   Temp: 97.9 F (36.6 C)   TempSrc: Oral   SpO2: 100% 100%  Weight:  104.3 kg (230 lb)  Height:  5\' 6"  (1.676 m)   Eyes: PERRL, lids and conjunctivae normal ENMT: Mucous membranes are dry. Posterior pharynx clear of any exudate or lesions. Multiple missing teeth. Neck: normal, supple, no masses, no thyromegaly Respiratory: Decreased overall aeration with positive crackles and rales appreciated. Patient able to talk in shorten sentences. Cardiovascular: Rapid Irregular regular,+SEM. No rubs / gallops. 3+ pitting edema noted of the bilateral lower extremities edema. 2+ pedal pulses. No carotid bruits.   Abdomen: no tenderness, no masses palpated. No hepatosplenomegaly. Bowel sounds positive.  Musculoskeletal: no clubbing / cyanosis. No joint deformity upper and lower extremities. Good ROM, no contractures. Normal muscle tone. Swelling of the right upper extremity twice size of left upper extremity. Skin: no rashes, lesions, ulcers. No induration  Neurologic: CN 2-12 grossly intact. Sensation intact, DTR normal. Strength 5/5 in all 4.  Psychiatric: Normal judgment and insight. Alert and oriented x 3. Normal mood.     Labs on Admission: I have personally reviewed following labs and imaging studies  CBC:  Recent Labs Lab 11/22/16 0830 11/27/16 0730 11/28/16 1617  WBC 12.5* 10.1 10.8*  NEUTROABS 10.8* 8.3* 9.0*  HGB 13.3 12.6 12.9  HCT 40.7 38.6 40.5  MCV 91.9 92.6 94.0  PLT 372 328 375   Basic Metabolic Panel:  Recent Labs Lab 11/22/16 0830 11/24/16 1822 11/27/16 0730 11/28/16 1617  NA 132* 129* 132* 133*  K 3.8 3.8 3.7 4.0  CL 90* 90* 91* 91*  CO2 33* 32 33* 33*  GLUCOSE 97 140* 105* 135*  BUN 15 20 21* 23*  CREATININE 0.65 0.71  0.63 0.71  CALCIUM 8.1* 7.7* 7.8* 8.1*   GFR: Estimated Creatinine Clearance: 62.7 mL/min (by C-G formula based on SCr of 0.71 mg/dL). Liver Function Tests:  Recent Labs Lab 11/22/16 0830 11/24/16 1822 11/28/16 1617  AST 27 29 32  ALT 19 17 21   ALKPHOS 52 64 55  BILITOT 1.3* 1.0 0.7  PROT 6.2* 6.4* 6.7  ALBUMIN 2.3* 2.2* 2.1*   No results for input(s): LIPASE, AMYLASE in the last 168 hours. No results for input(s): AMMONIA in the last 168 hours. Coagulation Profile: No results for input(s): INR, PROTIME in the last 168 hours. Cardiac Enzymes:  Recent Labs Lab 11/28/16 1617  TROPONINI <0.03   BNP (last 3 results) No results for input(s): PROBNP in the last 8760 hours. HbA1C: No results for input(s): HGBA1C in the last 72 hours. CBG: No results for input(s): GLUCAP in the last 168 hours. Lipid Profile: No results for input(s): CHOL, HDL, LDLCALC, TRIG, CHOLHDL, LDLDIRECT in the last 72 hours. Thyroid Function Tests: No  results for input(s): TSH, T4TOTAL, FREET4, T3FREE, THYROIDAB in the last 72 hours. Anemia Panel: No results for input(s): VITAMINB12, FOLATE, FERRITIN, TIBC, IRON, RETICCTPCT in the last 72 hours. Urine analysis:    Component Value Date/Time   COLORURINE AMBER (A) 11/17/2016 0023   APPEARANCEUR HAZY (A) 11/17/2016 0023   LABSPEC 1.020 11/17/2016 0023   PHURINE 5.0 11/17/2016 0023   GLUCOSEU NEGATIVE 11/17/2016 0023   HGBUR SMALL (A) 11/17/2016 0023   BILIRUBINUR NEGATIVE 11/17/2016 0023   KETONESUR NEGATIVE 11/17/2016 0023   PROTEINUR NEGATIVE 11/17/2016 0023   NITRITE NEGATIVE 11/17/2016 0023   LEUKOCYTESUR TRACE (A) 11/17/2016 0023   Sepsis Labs: No results found for this or any previous visit (from the past 240 hour(s)).   Radiological Exams on Admission: Dg Chest Port 1 View  Result Date: 11/28/2016 CLINICAL DATA:  Acute onset of shortness of breath and tachycardia. Right-sided soft tissue edema. Initial encounter. EXAM: PORTABLE CHEST  1 VIEW COMPARISON:  Chest radiograph performed 11/17/2016 FINDINGS: The lungs are well-aerated. Left basilar opacity may reflect a combination of airspace opacification and small left pleural effusion. Underlying vascular congestion is noted. No pneumothorax is seen. Right paratracheal prominence is again noted. If not previously assessed, CT of the chest could be considered for further evaluation on an elective nonemergent basis. The heart is mildly enlarged. No acute osseous abnormalities are seen. IMPRESSION: 1. Left basilar opacity may reflect a combination of airspace opacification and small left pleural effusion. Pneumonia or possibly mild asymmetric interstitial edema could have such an appearance. 2. Underlying vascular congestion and mild cardiomegaly. 3. Right paratracheal prominence again noted. If not previously assessed, CT of the chest could be considered for further evaluation on an elective nonemergent basis. Electronically Signed   By: Roanna RaiderJeffery  Chang M.D.   On: 11/28/2016 16:35    EKG: Independently reviewed. Atrial fibrillation 122 bpm  Assessment/Plan Paroxysmal Atrial fibrillation: [facility] with elevated heart rates of 120-140. CHADSVASC 2 score of 5. Currently on metoprolol, but this does not appear to be providing adequate control of heart rhythms. - Admit to stepdown - Continue diltiazem drip,as tolerated - Continue Eliquis  - Consult cardiology in a.m.  Diastolic CHF exacerbation/ pulmonary edema: Acute on chronic. Patient presents with elevated BNP of 240 as well as swelling of the upper and lower extremities. Chest x-ray showing possible CHF pulmonary edema versus pneumonia. Patient with a persistent cough. Last echocardiogram performed 11/02/2016 noted EF 60-65%. Given clinical history it appears the patient's CHF could likely be exacerbated from atrial fibrillation. Review of previous hospitalizations it appears patient was noted to weigh 224 pounds  on 2/6, but presents with a weight of 238 admission today.  - Strict ins and outs and daily weights  - Lasix 20 mg IV BID(2/2 soft  blood pressures), adjust  diuresis as needed  Question of HCAP: As seen above.  - Empiric antibiotics of vancomycin and cefepime - Follow-up pro-calcitonin and sputum cultures - Mucinex   - DuoNeb's prn sob/wheezing  SIRS/leukocytosis: WBC 10.8 and other SIRS criteria include tachycardia and tachypnea. Patient just recently hospitalized and had been antibiotics of Levaquin. Pneumonia possible source of infection. - Follow-up sputum/blood culture and urinalysis - SIRS protocol initiated - Check CBC in a.m.   Right upper extremity swelling: Noted by the family who report patient recently had IV lines in that arm previously. - Check vascular US RUE  Generalized weakness - Physical therapy to eval and treat  Essential hypertension - hold  amlodipine, methyldopa, and metoprolol due to low normal blood pressures on admission  Hypothyroidism: TSH previously noted to be within normal limits at 1.457 on 2/17. - Continue Synthroid  Hyponatremia: Acute on chronic. Sodium 133 on admission - Continue sodium chloride tablet  Hyperlipidemia - Continue fish oil supplementation  DVT prophylaxis: Eliquis Code Status: Full Family Communication: Discussed plan of care with the patient and family present at bedside  Disposition Plan: Discharged back to skilled nursing facility once medically stable  Consults called: none Admission status: observation  Clydie Braun MD [facility] Pager [phone]  If 7PM-7AM, please contact night-coverage [email or web address] Password [facility]  11/28/2016, 6:33 PM

## 2016-11-28 NOTE — ED Provider Notes (Addendum)
[facility]Y DEPT Provider Note   CSN: [id number] Arrival date & time: 11/28/16  1530     History   Chief Complaint Chief Complaint  Patient presents with  . Tachycardia    right side edema    HPI Yvonne Chan is a 83 y.o. female.  Patient is an 83 year old female with history of atrial fibrillation, hypertension, and congestive heart failure. She was sent from the [facility] for evaluation of irregular heartbeat. Her heart apparently began to beat irregularly and rapidly this afternoon and she is sent for evaluation of this. According to the daughter at bedside, she has had increased swelling of her arms and legs. Patient reports feeling fatigued, but denies chest pain. She does feel somewhat short of breath.  She has had recent admissions for CHF and influenza.   The history is provided by the patient.    Past Medical History:  Diagnosis Date  . Atrial fibrillation (HCC)   . Essential hypertension   . Hypothyroidism   . Lumbago   . Neuralgia   . Neuritis     Patient Active Problem List   Diagnosis Date Noted  . Influenza 11/17/2016  . Hyponatremia 11/07/2016  . Hyperglycemia 11/07/2016  . Weakness 11/07/2016  . Atrial fibrillation with RVR (HCC) 11/01/2016  . HTN (hypertension) 11/01/2016  . Hypothyroidism 11/01/2016  . Acute diastolic CHF (congestive heart failure) (HCC) 11/01/2016    Past Surgical History:  Procedure Laterality Date  . ABDOMINAL HYSTERECTOMY    . APPENDECTOMY      OB History    Gravida Para Term Preterm AB Living   10         5   SAB TAB Ectopic Multiple Live Births                   Home Medications    Prior to Admission medications   Medication Sig Start Date End Date Taking? Authorizing Provider  acetaminophen (TYLENOL) 325 MG tablet Take 650 mg by mouth every 6 (six) hours as needed.    Historical Provider, MD  amLODipine (NORVASC) 5 MG tablet Take 1 tablet (5 mg total) by mouth daily. 11/17/16   Filbert SchilderAlexandria U Kadolph, MD    apixaban (ELIQUIS) 5 MG TABS tablet Take 1 tablet (5 mg total) by mouth 2 (two) times daily. 11/06/16   Henderson CloudEstela Y Hernandez Acosta, MD  furosemide (LASIX) 40 MG tablet Take 1 tablet (40 mg total) by mouth daily. 11/07/16   Henderson CloudEstela Y Hernandez Acosta, MD  ipratropium (ATROVENT) 0.02 % nebulizer solution Take 0.5 mg by nebulization every 6 (six) hours as needed for wheezing or shortness of breath.    Historical Provider, MD  levofloxacin (LEVAQUIN) 250 MG tablet Take 250 mg by mouth daily.    Historical Provider, MD  levothyroxine (SYNTHROID, LEVOTHROID) 50 MCG tablet Take 50 mcg by mouth daily before breakfast.    Historical Provider, MD  methyldopa (ALDOMET) 250 MG tablet Take 250 mg by mouth daily.    Historical Provider, MD  metoprolol tartrate (LOPRESSOR) 25 MG tablet Take 1 tablet (25 mg total) by mouth 2 (two) times daily. 11/06/16   Henderson CloudEstela Y Hernandez Acosta, MD  Omega-3 Fatty Acids (FISH OIL) 1000 MG CAPS Take 1 capsule by mouth daily.    Historical Provider, MD  OXYGEN Inhale 2 L into the lungs daily as needed. To keep sats greater or equal to 90%    Historical Provider, MD  potassium chloride (K-DUR) 10 MEQ tablet Take 10 mEq by mouth  daily.    Historical Provider, MD  Probiotic Product (RISA-BID PROBIOTIC) TABS Take 1 tablet by mouth twice a day    Historical Provider, MD  sodium chloride 1 g tablet Take 1 tablet (1 g total) by mouth 2 (two) times daily with a meal. 11/16/16   Filbert Schilder, MD    Family History No family history on file.  Social History Social History  Substance Use Topics  . Smoking status: Never Smoker  . Smokeless tobacco: Never Used  . Alcohol use No     Allergies   Patient has no known allergies.   Review of Systems Review of Systems  All other systems reviewed and are negative.    Physical Exam Updated Vital Signs BP 98/57 (BP Location: Left Arm)   Pulse 64   Temp 97.9 F (36.6 C) (Oral)   Resp 26   Ht 5\' 6"  (1.676 m)   Wt 230 lb (104.3  kg)   SpO2 100%   BMI 37.12 kg/m   Physical Exam  Constitutional: She is oriented to person, place, and time. She appears well-developed and well-nourished. No distress.  HENT:  Head: Normocephalic and atraumatic.  Neck: Normal range of motion. Neck supple.  Cardiovascular: Exam reveals no gallop and no friction rub.   No murmur heard. RT is irregularly irregular and rapid.  Pulmonary/Chest: Effort normal. No respiratory distress. She has no wheezes. She has rales.  There are rales in the bases bilaterally.  Abdominal: Soft. Bowel sounds are normal. She exhibits no distension. There is no tenderness.  Musculoskeletal: Normal range of motion. She exhibits edema.  There is 3+ pitting edema/anasarca of all extremities.  Neurological: She is alert and oriented to person, place, and time.  Skin: Skin is warm and dry. She is not diaphoretic.  Nursing note and vitals reviewed.    ED Treatments / Results  Labs (all labs ordered are listed, but only abnormal results are displayed) Labs Reviewed  COMPREHENSIVE METABOLIC PANEL  CBC WITH DIFFERENTIAL/PLATELET  BRAIN NATRIURETIC PEPTIDE  TROPONIN I    EKG ED ECG REPORT   Date: 11/28/2016  Rate: 122  Rhythm: atrial fibrillation  QRS Axis: normal  Intervals: normal  ST/T Wave abnormalities: normal  Conduction Disutrbances:none  Narrative Interpretation:   Old EKG Reviewed: none available  I have personally reviewed the EKG tracing and agree with the computerized printout as noted.   Radiology No results found.  Procedures Procedures (including critical care time)  Medications Ordered in ED Medications - No data to display   Initial Impression / Assessment and Plan / ED Course  I have reviewed the triage vital signs and the nursing notes.  Pertinent labs & imaging results that were available during my care of the patient were reviewed by me and considered in my medical decision making (see chart for details).  Patient  with elevated heart rate. She is in atrial fibrillation with a rate in the 120s to 140s. She is also having swelling of all extremities. This appears to be a CHF exacerbation, possibly related to her atrial fibrillation. She was given Cardizem for rate control cautiously as she has been borderline hypotensive while here. She will be admitted to the hospitalist service under the care of Dr. Katrinka Blazing.  CRITICAL CARE Performed by: Geoffery Lyons Total critical care time: 30 minutes Critical care time was exclusive of separately billable procedures and treating other patients. Critical care was necessary to treat or prevent imminent or life-threatening deterioration. Critical care was time  spent personally by me on the following activities: development of treatment plan with patient and/or surrogate as well as nursing, discussions with consultants, evaluation of patient's response to treatment, examination of patient, obtaining history from patient or surrogate, ordering and performing treatments and interventions, ordering and review of laboratory studies, ordering and review of radiographic studies, pulse oximetry and re-evaluation of patient's condition.  Final Clinical Impressions(s) / ED Diagnoses   Final diagnoses:  None    New Prescriptions New Prescriptions   No medications on file     Geoffery Lyons, MD 11/28/16 1610    Geoffery Lyons, MD 11/28/16 9604

## 2016-11-28 NOTE — Progress Notes (Signed)
Pharmacy Antibiotic Note  Yvonne Chan is a 83 y.o. female admitted on 11/28/2016 with pneumonia.  Pharmacy has been consulted for Vancomycin and cefepime dosing.  Plan: Vancomycin 1gm in ED, will give additional 1gm now for 2gm loading dose, then 750mg  IV q12. Goal troughl level is 15-5520mcg/ml Cefepime 2gm IV q8h F/U cxs and clinical progress Monitor V/S, labs, and levels as indicated   Height: 5\' 11"  (180.3 cm) Weight: 238 lb 15.7 oz (108.4 kg) IBW/kg (Calculated) : 70.8  Temp (24hrs), Avg:98 F (36.7 C), Min:97.9 F (36.6 C), Max:98.1 F (36.7 C)   Recent Labs Lab 11/22/16 0830 11/24/16 1822 11/27/16 0730 11/28/16 1617  WBC 12.5*  --  10.1 10.8*  CREATININE 0.65 0.71 0.63 0.71    Normalized CrCl 8665mls/min  Estimated Creatinine Clearance: 69.6 mL/min (by C-G formula based on SCr of 0.71 mg/dL).    No Known Allergies  Antimicrobials this admission: Vancomycin 2/21 >>  Cefepime 2/21>> Zosyn 2/21 x 1 in ED  Dose adjustments this admission: N/A  Microbiology results: 2/21 BCx: pending 2/1 MRSA PCR: negative  Thank you for allowing pharmacy to be a part of this patient's care.  Elder CyphersLorie Jeyren Danowski, BS Loura Backharm D, [place] Clinical Pharmacist Pager [phone] 11/28/2016 9:19 PM

## 2016-11-28 NOTE — ED Notes (Signed)
Dr. Smith at bedside,

## 2016-11-29 ENCOUNTER — Observation Stay (HOSPITAL_COMMUNITY): Payer: Medicare HMO

## 2016-11-29 DIAGNOSIS — E669 Obesity, unspecified: Secondary | ICD-10-CM | POA: Diagnosis present

## 2016-11-29 DIAGNOSIS — R531 Weakness: Secondary | ICD-10-CM | POA: Diagnosis not present

## 2016-11-29 DIAGNOSIS — E038 Other specified hypothyroidism: Secondary | ICD-10-CM | POA: Diagnosis not present

## 2016-11-29 DIAGNOSIS — I480 Paroxysmal atrial fibrillation: Secondary | ICD-10-CM | POA: Diagnosis present

## 2016-11-29 DIAGNOSIS — I959 Hypotension, unspecified: Secondary | ICD-10-CM | POA: Diagnosis present

## 2016-11-29 DIAGNOSIS — I5033 Acute on chronic diastolic (congestive) heart failure: Secondary | ICD-10-CM | POA: Diagnosis present

## 2016-11-29 DIAGNOSIS — Z79899 Other long term (current) drug therapy: Secondary | ICD-10-CM | POA: Diagnosis not present

## 2016-11-29 DIAGNOSIS — Z7982 Long term (current) use of aspirin: Secondary | ICD-10-CM | POA: Diagnosis not present

## 2016-11-29 DIAGNOSIS — Y95 Nosocomial condition: Secondary | ICD-10-CM | POA: Diagnosis present

## 2016-11-29 DIAGNOSIS — E871 Hypo-osmolality and hyponatremia: Secondary | ICD-10-CM | POA: Diagnosis present

## 2016-11-29 DIAGNOSIS — Z6833 Body mass index (BMI) 33.0-33.9, adult: Secondary | ICD-10-CM | POA: Diagnosis not present

## 2016-11-29 DIAGNOSIS — M7989 Other specified soft tissue disorders: Secondary | ICD-10-CM | POA: Diagnosis present

## 2016-11-29 DIAGNOSIS — I5032 Chronic diastolic (congestive) heart failure: Secondary | ICD-10-CM | POA: Diagnosis not present

## 2016-11-29 DIAGNOSIS — J690 Pneumonitis due to inhalation of food and vomit: Secondary | ICD-10-CM | POA: Diagnosis present

## 2016-11-29 DIAGNOSIS — I4891 Unspecified atrial fibrillation: Secondary | ICD-10-CM | POA: Diagnosis present

## 2016-11-29 DIAGNOSIS — I5031 Acute diastolic (congestive) heart failure: Secondary | ICD-10-CM | POA: Diagnosis not present

## 2016-11-29 DIAGNOSIS — J189 Pneumonia, unspecified organism: Secondary | ICD-10-CM

## 2016-11-29 DIAGNOSIS — Z7901 Long term (current) use of anticoagulants: Secondary | ICD-10-CM | POA: Diagnosis not present

## 2016-11-29 DIAGNOSIS — E039 Hypothyroidism, unspecified: Secondary | ICD-10-CM | POA: Diagnosis present

## 2016-11-29 DIAGNOSIS — E785 Hyperlipidemia, unspecified: Secondary | ICD-10-CM | POA: Diagnosis present

## 2016-11-29 DIAGNOSIS — I481 Persistent atrial fibrillation: Secondary | ICD-10-CM | POA: Diagnosis present

## 2016-11-29 DIAGNOSIS — I110 Hypertensive heart disease with heart failure: Secondary | ICD-10-CM | POA: Diagnosis present

## 2016-11-29 LAB — CBC
HEMATOCRIT: 37.3 % (ref 36.0–46.0)
HEMOGLOBIN: 12.3 g/dL (ref 12.0–15.0)
MCH: 30.4 pg (ref 26.0–34.0)
MCHC: 33 g/dL (ref 30.0–36.0)
MCV: 92.3 fL (ref 78.0–100.0)
Platelets: 345 10*3/uL (ref 150–400)
RBC: 4.04 MIL/uL (ref 3.87–5.11)
RDW: 13.9 % (ref 11.5–15.5)
WBC: 11.8 10*3/uL — ABNORMAL HIGH (ref 4.0–10.5)

## 2016-11-29 LAB — BASIC METABOLIC PANEL
ANION GAP: 9 (ref 5–15)
BUN: 22 mg/dL — AB (ref 6–20)
CHLORIDE: 92 mmol/L — AB (ref 101–111)
CO2: 32 mmol/L (ref 22–32)
Calcium: 7.9 mg/dL — ABNORMAL LOW (ref 8.9–10.3)
Creatinine, Ser: 0.67 mg/dL (ref 0.44–1.00)
GFR calc Af Amer: >60 mL/min (ref >60)
GFR calc non Af Amer: >60 mL/min (ref >60)
GLUCOSE: 101 mg/dL — AB (ref 65–99)
POTASSIUM: 3.6 mmol/L (ref 3.5–5.1)
Sodium: 133 mmol/L — ABNORMAL LOW (ref 135–145)

## 2016-11-29 LAB — MRSA PCR SCREENING: MRSA by PCR: NEGATIVE

## 2016-11-29 LAB — LACTIC ACID, PLASMA: Lactic Acid, Venous: 1.4 mmol/L (ref 0.5–1.9)

## 2016-11-29 LAB — URINALYSIS, ROUTINE W REFLEX MICROSCOPIC
BACTERIA UA: NONE SEEN
Bilirubin Urine: NEGATIVE
GLUCOSE, UA: NEGATIVE mg/dL
Ketones, ur: NEGATIVE mg/dL
Leukocytes, UA: NEGATIVE
Nitrite: NEGATIVE
PH: 5.5 (ref 5.0–8.0)
Protein, ur: NEGATIVE mg/dL
Specific Gravity, Urine: 1.01 (ref 1.005–1.030)

## 2016-11-29 LAB — URINALYSIS, MICROSCOPIC (REFLEX)
Squamous Epithelial / HPF: NONE SEEN
WBC UA: NONE SEEN WBC/hpf (ref 0–5)

## 2016-11-29 LAB — STREP PNEUMONIAE URINARY ANTIGEN: STREP PNEUMO URINARY ANTIGEN: NEGATIVE

## 2016-11-29 LAB — MAGNESIUM: Magnesium: 1.6 mg/dL — ABNORMAL LOW (ref 1.7–2.4)

## 2016-11-29 MED ORDER — METOPROLOL TARTRATE 5 MG/5ML IV SOLN
2.5000 mg | Freq: Once | INTRAVENOUS | Status: AC
  Administered 2016-11-29: 2.5 mg via INTRAVENOUS
  Filled 2016-11-29: qty 5

## 2016-11-29 MED ORDER — SODIUM CHLORIDE 0.9 % IV SOLN
3.0000 g | Freq: Four times a day (QID) | INTRAVENOUS | Status: DC
  Administered 2016-11-29 – 2016-12-05 (×24): 3 g via INTRAVENOUS
  Filled 2016-11-29 (×28): qty 3

## 2016-11-29 MED ORDER — DILTIAZEM HCL 30 MG PO TABS
30.0000 mg | ORAL_TABLET | Freq: Four times a day (QID) | ORAL | Status: DC
  Administered 2016-11-29 – 2016-11-30 (×4): 30 mg via ORAL
  Filled 2016-11-29 (×4): qty 1

## 2016-11-29 NOTE — Progress Notes (Signed)
PROGRESS NOTE    Yvonne Chan  ZOX:[id number] DOB: 06-21-1929 DOA: 11/28/2016 PCP: Phyllis Ginger     Brief Narrative:  83 year old woman [facility] after she was noted to have an elevated heart rate. She has also been noticed to have right upper extremity edema and some mild shortness of breath. She was recently hospitalized for influenza and has had a few recent admissions for pneumonia, presumed hospital-acquired. Admission has been requested for further evaluation and management.   Assessment & Plan:   Principal Problem:   A-fib (HCC) Active Problems:   Hypothyroidism   Acute diastolic CHF (congestive heart failure) (HCC)   Hyponatremia   Weakness   Swelling of right upper extremity   Atrial fibrillation with rapid ventricular response -When seen this morning was on a Cardizem drip, however remained hypotensive with blood pressures in the 70s to 90s. Blood pressure has now normalized and is in the low 100s. Seen by cardiology who has recommended transitioning Cardizem over to oral. Will continue to monitor blood pressure closely. -Patient is anticoagulated on Eliquis and this will be continued. -Heart rate is now in the 90s to low 100s.  Right upper extremity edema -Doppler without evidence for DVT.  Hospital-acquired pneumonia -Has had frequent admissions for same. -I suspect there may be a component of aspiration and have requested a speech therapy evaluation. -MRSA PCR is negative, will discontinue vancomycin for now.  -Will transition over to Unasyn to provide better coverage against aspiration pathogens.  Acute on chronic diastolic CHF -Patient appears mildly volume overloaded on exam. Echo in January 2018 shows an ejection fraction of 60-65%. -Suspect pulmonary edema noted on chest x-ray is likely related to rapid rate with her A. Fib. -Continue low dose Lasix of 20 mg IV twice a day due to her soft blood pressures, she is  970 mL negative since admission.   DVT prophylaxis: Eliquis Code Status: Full Code Family Communication: patient only Disposition Plan: Keep in SDU today  Consultants:   Cardiology  Procedures:   None  Antimicrobials:  Anti-infectives    Start     Dose/Rate Route Frequency Ordered Stop   11/29/16 1000  vancomycin (VANCOCIN) IVPB 750 mg/150 ml premix     750 mg 150 mL/hr over 60 Minutes Intravenous Every 12 hours 11/28/16 2130     11/29/16 0000  ceFEPIme (MAXIPIME) 2 g in dextrose 5 % 50 mL IVPB     2 g 100 mL/hr over 30 Minutes Intravenous Every 8 hours 11/28/16 2130     11/28/16 2200  vancomycin (VANCOCIN) IVPB 1000 mg/200 mL premix     1,000 mg 200 mL/hr over 60 Minutes Intravenous  Once 11/28/16 2130 11/28/16 2310   11/28/16 1830  vancomycin (VANCOCIN) IVPB 1000 mg/200 mL premix     1,000 mg 200 mL/hr over 60 Minutes Intravenous  Once 11/28/16 1821 11/28/16 2112   11/28/16 1830  piperacillin-tazobactam (ZOSYN) IVPB 3.375 g     3.375 g 12.5 mL/hr over 240 Minutes Intravenous  Once 11/28/16 1821 11/28/16 1933       Subjective: Alert to person only, appears mildly confused, able to insert a simple questions  Objective: Vitals:   11/29/16 1100 11/29/16 1150 11/29/16 1200 11/29/16 1300  BP: 105/77  114/73 102/71  Pulse: (!) 107 (!) 104 (!) 112 (!) 107  Resp: (!) 25 (!) 28 (!) 22 (!) 22  Temp:  99 F (37.2 C)    TempSrc:  Oral  SpO2: 96% 96% 97% 97%  Weight:      Height:        Intake/Output Summary (Last 24 hours) at 11/29/16 1504 Last data filed at 11/29/16 1430  Gross per 24 hour  Intake          1322.75 ml  Output             2300 ml  Net          -977.25 ml   Filed Weights   11/28/16 1603 11/28/16 2054 11/29/16 0500  Weight: 104.3 kg (230 lb) 108.4 kg (238 lb 15.7 oz) 106.8 kg (235 lb 7.2 oz)    Examination:  General exam:  awake, oriented x 1 Respiratory system: Mild bibasilar crackles Cardiovascular system: irregular rate and rhythm, no  murmurs, rubs or gallops auscultated Gastrointestinal system: Abdomen is nondistended, soft and nontender. No organomegaly or masses felt. Normal bowel sounds heard. Central nervous system: Moves all 4 spontaneously Extremities: Right greater than left upper extremity edema, 1+ bilateral pitting edema Skin: No rashes, lesions or ulcers    Data Reviewed: I have personally reviewed following labs and imaging studies  CBC:  Recent Labs Lab 11/27/16 0730 11/28/16 1617 11/29/16 0426  WBC 10.1 10.8* 11.8*  NEUTROABS 8.3* 9.0*  --   HGB 12.6 12.9 12.3  HCT 38.6 40.5 37.3  MCV 92.6 94.0 92.3  PLT 328 375 345   Basic Metabolic Panel:  Recent Labs Lab 11/24/16 1822 11/27/16 0730 11/28/16 1617 11/29/16 0426  NA 129* 132* 133* 133*  K 3.8 3.7 4.0 3.6  CL 90* 91* 91* 92*  CO2 32 33* 33* 32  GLUCOSE 140* 105* 135* 101*  BUN 20 21* 23* 22*  CREATININE 0.71 0.63 0.71 0.67  CALCIUM 7.7* 7.8* 8.1* 7.9*  MG  --   --   --  1.6*   GFR: Estimated Creatinine Clearance: 69.2 mL/min (by C-G formula based on SCr of 0.67 mg/dL). Liver Function Tests:  Recent Labs Lab 11/24/16 1822 11/28/16 1617  AST 29 32  ALT 17 21  ALKPHOS 64 55  BILITOT 1.0 0.7  PROT 6.4* 6.7  ALBUMIN 2.2* 2.1*   No results for input(s): LIPASE, AMYLASE in the last 168 hours. No results for input(s): AMMONIA in the last 168 hours. Coagulation Profile: No results for input(s): INR, PROTIME in the last 168 hours. Cardiac Enzymes:  Recent Labs Lab 11/28/16 1617  TROPONINI <0.03   BNP (last 3 results) No results for input(s): PROBNP in the last 8760 hours. HbA1C: No results for input(s): HGBA1C in the last 72 hours. CBG: No results for input(s): GLUCAP in the last 168 hours. Lipid Profile: No results for input(s): CHOL, HDL, LDLCALC, TRIG, CHOLHDL, LDLDIRECT in the last 72 hours. Thyroid Function Tests: No results for input(s): TSH, T4TOTAL, FREET4, T3FREE, THYROIDAB in the last 72 hours. Anemia  Panel: No results for input(s): VITAMINB12, FOLATE, FERRITIN, TIBC, IRON, RETICCTPCT in the last 72 hours. Urine analysis:    Component Value Date/Time   COLORURINE YELLOW 11/29/2016 0400   APPEARANCEUR CLEAR 11/29/2016 0400   LABSPEC 1.010 11/29/2016 0400   PHURINE 5.5 11/29/2016 0400   GLUCOSEU NEGATIVE 11/29/2016 0400   HGBUR TRACE (A) 11/29/2016 0400   BILIRUBINUR NEGATIVE 11/29/2016 0400   KETONESUR NEGATIVE 11/29/2016 0400   PROTEINUR NEGATIVE 11/29/2016 0400   NITRITE NEGATIVE 11/29/2016 0400   LEUKOCYTESUR NEGATIVE 11/29/2016 0400   Sepsis Labs: @LABRCNTIP (procalcitonin:4,lacticidven:4)  ) Recent Results (from the past 240 hour(s))  Culture, blood (routine  x 2)     Status: None (Preliminary result)   Collection Time: 11/28/16  7:57 PM  Result Value Ref Range Status   Specimen Description LEFT ANTECUBITAL  Final   Special Requests BOTTLES DRAWN AEROBIC AND ANAEROBIC 6CC  Final   Culture NO GROWTH < 24 HOURS  Final   Report Status PENDING  Incomplete  Culture, blood (routine x 2)     Status: None (Preliminary result)   Collection Time: 11/28/16  8:08 PM  Result Value Ref Range Status   Specimen Description BLOOD LEFT HAND  Final   Special Requests BOTTLES DRAWN AEROBIC ONLY 6CC  Final   Culture NO GROWTH < 24 HOURS  Final   Report Status PENDING  Incomplete  MRSA PCR Screening     Status: None   Collection Time: 11/28/16  8:41 PM  Result Value Ref Range Status   MRSA by PCR NEGATIVE NEGATIVE Final    Comment:        The GeneXpert MRSA Assay (FDA approved for NASAL specimens only), is one component of a comprehensive MRSA colonization surveillance program. It is not intended to diagnose MRSA infection nor to guide or monitor treatment for MRSA infections.          Radiology Studies: US Venous Img Upper Uni Right  Result Date: 11/29/2016 CLINICAL DATA:  Right arm swelling EXAM: Right UPPER EXTREMITY VENOUS DOPPLER ULTRASOUND TECHNIQUE: Gray-scale  sonography with graded compression, as well as color Doppler and duplex ultrasound were performed to evaluate the upper extremity deep venous system from the level of the subclavian vein and including the jugular, axillary, basilic, radial, ulnar and upper cephalic vein. Spectral Doppler was utilized to evaluate flow at rest and with distal augmentation maneuvers. COMPARISON:  None. FINDINGS: Contralateral Subclavian Vein: Respiratory phasicity is normal and symmetric with the symptomatic side. No evidence of thrombus. Normal compressibility. Internal Jugular Vein: No evidence of thrombus. Normal compressibility, respiratory phasicity and response to augmentation. Subclavian Vein: No evidence of thrombus. Normal compressibility, respiratory phasicity and response to augmentation. Axillary Vein: No evidence of thrombus. Normal compressibility, respiratory phasicity and response to augmentation. Cephalic Vein: No evidence of thrombus. Normal compressibility, respiratory phasicity and response to augmentation. Basilic Vein: No evidence of thrombus. Normal compressibility, respiratory phasicity and response to augmentation. Brachial Veins: No evidence of thrombus. Normal compressibility, respiratory phasicity and response to augmentation. Radial Veins: No evidence of thrombus. Normal compressibility, respiratory phasicity and response to augmentation. Ulnar Veins: No evidence of thrombus. Normal compressibility, respiratory phasicity and response to augmentation. Venous Reflux:  None visualized. Other Findings:  None visualized. IMPRESSION: No evidence of deep venous thrombosis. Electronically Signed   By: Marlan Palau M.D.   On: 11/29/2016 11:58   Dg Chest Port 1 View  Result Date: 11/28/2016 CLINICAL DATA:  Acute onset of shortness of breath and tachycardia. Right-sided soft tissue edema. Initial encounter. EXAM: PORTABLE CHEST 1 VIEW COMPARISON:  Chest radiograph performed 11/17/2016 FINDINGS: The lungs are  well-aerated. Left basilar opacity may reflect a combination of airspace opacification and small left pleural effusion. Underlying vascular congestion is noted. No pneumothorax is seen. Right paratracheal prominence is again noted. If not previously assessed, CT of the chest could be considered for further evaluation on an elective nonemergent basis. The heart is mildly enlarged. No acute osseous abnormalities are seen. IMPRESSION: 1. Left basilar opacity may reflect a combination of airspace opacification and small left pleural effusion. Pneumonia or possibly mild asymmetric interstitial edema could have such an appearance. 2.  Underlying vascular congestion and mild cardiomegaly. 3. Right paratracheal prominence again noted. If not previously assessed, CT of the chest could be considered for further evaluation on an elective nonemergent basis. Electronically Signed   By: Roanna Raider M.D.   On: 11/28/2016 16:35        Scheduled Meds: . acidophilus  1 capsule Oral Daily  . apixaban  5 mg Oral BID  . ceFEPime (MAXIPIME) IV  2 g Intravenous Q8H  . diltiazem  30 mg Oral Q6H  . furosemide  20 mg Intravenous BID  . guaiFENesin  600 mg Oral BID  . levothyroxine  50 mcg Oral QAC breakfast  . omega-3 acid ethyl esters  1 g Oral Daily  . potassium chloride  10 mEq Oral Daily  . sodium chloride  1 g Oral BID WC  . vancomycin  750 mg Intravenous Q12H   Continuous Infusions:   LOS: 0 days    Time spent: 25 minutes. Greater than 50% of this time was spent in direct contact with the patient coordinating care.     Chaya Jan, MD [facility] Pager [phone]  If 7PM-7AM, please contact night-coverage [email or web address] Password [facility] 11/29/2016, 3:04 PM

## 2016-11-29 NOTE — Evaluation (Signed)
Physical Therapy Evaluation Patient Details Name: Yvonne Chan MRN: [id number] DOB: 22-Jan-1929 Today's Date: 11/29/2016   History of Present Illness  83 y.o. female with medical history significant of HTN, PAF on Eliquis, diastolic CHF, and hypothyroidism; who presents from the [facility] for a fast irregular heartbeat noticed this afternoon by staff. According to family present at bedside she has had increasing swelling in her right arms and bilateral legs. Patient complains of feeling short of breath. Review of records shows that the patient has been hospitalized multiple times within the last month1/25-1/30, 1/31-2/9, and then 2/9-2/10. Treated for atrial fibrillation, diastolic CHF, influenza, and community-acquired pneumonia. Other associated symptoms include decreased appetite and generalized weakness. Patient denies any chest pain, dysuria, nausea, vomiting, or abdominal pain. Review of records shows the patient weight back on 2/6 was 224 pounds.   Dx: Diastolic CHF exacerbation/ pulmonary edema: Acute on chronic, and possible HCAP.     Clinical Impression  Pt received in bed, and is agreeable to PT evaluation.  Pt is only oriented x1, and states that she is at home.  She has had multiple admissions in the past 6 months and is known to this therapist from previous admission.  She had been discharged to the [facility], and pt states that she has not been out of the bed since going over there.  Prior to her admission to the [facility], she was ambulatory short household distances with a quad cane, and lived with her children. During today's PT evaluation, she required Max A for supine<>sit.  Once sitting on the EOB, she demonstrated poor static sitting balance as well as poor posture - allowing her head to hang forward with chin practically touching her chest.  When asked to lift her head, she is able, but she is not able/willing to maintain it in an upright position.  She refused attempt at  sit<>stand today, however would not give therapist reason why she was refusing.  She seems discouraged with her mobility, and when therapist inquires about her interests and goals for therapy, she does not answer.  She does state that she does not want to go back to rehab.  However, at this point, she is requiring such a high level of assistance for all functional mobility, she is not safe to return home. Continue to recommend SNF at this time.      Follow Up Recommendations SNF    Equipment Recommendations  None recommended by PT    Recommendations for Other Services       Precautions / Restrictions Precautions Precautions: Fall Precaution Comments: Due to immobility and poor safety awareness during mobiltiy.  Restrictions Weight Bearing Restrictions: No      Mobility  Bed Mobility Overal bed mobility: Needs Assistance Bed Mobility: Supine to Sit;Sit to Supine     Supine to sit: HOB elevated;Max assist Sit to supine: Total assist;+2 for safety/equipment;+2 for physical assistance   General bed mobility comments: Poor initiation due to decreased motivation to mobilize at this time.    Transfers Overall transfer level:  (Pt refused to attempt sit<>stand trial.  Pt was not able to state a reason why she was not willing to attempt standing. )                  Ambulation/Gait                Stairs            Wheelchair Mobility    Modified Rankin (  Stroke Patients Only)       Balance Overall balance assessment: Needs assistance Sitting-balance support: Bilateral upper extremity supported;Feet supported Sitting balance-Leahy Scale: Fair Sitting balance - Comments: Pt with extreme head forward position with chin practically resting on chest while sitting on EOB.  Pt is able to lift head into upright position, but unable/not willing to hold it upright.   Pt is slightly impulsive and suddenly leans right or left onto her elbows                                      Pertinent Vitals/Pain Pain Assessment: No/denies pain    Home Living   Living Arrangements: Children Available Help at Discharge: Available 24 hours/day Type of Home: House Home Access: Stairs to enter   [employer] of Steps: 2 Home Layout: One level Home Equipment: Cane - quad;Legros - standard;Shower seat      Prior Function     Gait / Transfers Assistance Needed: Prior to last admission she states she was ambulating with her QC in the house.  Pt states she is only able to ambualte room to room in the house before she needs to sit and rest.  Since going to the [facility] she has not been getting out of the bed per pt report.    ADL's / Homemaking Assistance Needed: Pt was independent with dressing, pt also reports independence with bathing - able to get into the shower and use the shower chair.  She was likely receiving assistnce with dressing and bathing at the [facility], however pt states she did not receive any help for that.         Hand Dominance        Extremity/Trunk Assessment   Upper Extremity Assessment Upper Extremity Assessment: RUE deficits/detail RUE Deficits / Details: extensive edema in R UE, however per OT note 3 weeks ago, pt has chronic pain in R UE.      Lower Extremity Assessment Lower Extremity Assessment: Generalized weakness       Communication      Cognition Arousal/Alertness: Awake/alert Behavior During Therapy: WFL for tasks assessed/performed     Orientation Level: Disoriented to;Place;Time;Situation (Pt states she is at home. )             General Comments: During tx pt is very withdrawn, and does not always answer questions.      General Comments      Exercises     Assessment/Plan    PT Assessment Patient needs continued PT services  PT Problem List Decreased strength;Decreased activity tolerance;Decreased balance;Decreased mobility;Decreased knowledge of use of DME;Decreased safety  awareness;Decreased knowledge of precautions;Cardiopulmonary status limiting activity;Obesity       PT Treatment Interventions DME instruction;Gait training;Functional mobility training;Therapeutic activities;Therapeutic exercise;Balance training;Neuromuscular re-education;Patient/family education;Cognitive remediation    PT Goals (Current goals can be found in the Care Plan section)  Acute Rehab PT Goals Patient Stated Goal: None stated PT Goal Formulation: With patient Time For Goal Achievement: 12/13/16 Potential to Achieve Goals: Poor    Frequency Min 3X/week   Barriers to discharge        Co-evaluation               End of Session Equipment Utilized During Treatment: Oxygen Activity Tolerance: Patient limited by lethargy Patient left: in chair;with call bell/phone within reach;with family/visitor present;with nursing/sitter in room Nurse Communication: Mobility status;Need for lift  equipment Michiel Sites(Hoyer lift) PT Visit Diagnosis: Muscle weakness (generalized) (M62.81);Other abnormalities of gait and mobility (R26.89)    Functional Assessment Tool Used: AM-PAC 6 Clicks Basic Mobility;Clinical judgement Mobility: Walking and Moving Around Current Status (Z6109(G8978): At least 60 percent but less than 80 percent impaired, limited or restricted Mobility: Walking and Moving Around Goal Status [phone](G8979): At least 40 percent but less than 60 percent impaired, limited or restricted    Time: 1430-1501 PT Time Calculation (min) (ACUTE ONLY): 31 min   Charges:   PT Evaluation $PT Eval Low Complexity: 1 Procedure PT Treatments $Therapeutic Activity: 8-22 mins   PT G Codes:   PT G-Codes **NOT FOR INPATIENT CLASS** Functional Assessment Tool Used: AM-PAC 6 Clicks Basic Mobility;Clinical judgement Mobility: Walking and Moving Around Current Status (U9811(G8978): At least 60 percent but less than 80 percent impaired, limited or restricted Mobility: Walking and Moving Around Goal Status [phone](G8979):  At least 40 percent but less than 60 percent impaired, limited or restricted      Beth Hadassah Rana, PT, DPT X: [phone]

## 2016-11-29 NOTE — Progress Notes (Signed)
Pharmacy Antibiotic Note  Yvonne Chan is a 83 y.o. female admitted on 11/28/2016 with aspiration pneumonia.  Pharmacy has been consulted for unasyn dosing.  Plan: Unasyn 3gm IV q6h F/U cxs and clinical progress Monitor V/S, labs  Height: 5\' 11"  (180.3 cm) Weight: 235 lb 7.2 oz (106.8 kg) IBW/kg (Calculated) : 70.8  Temp (24hrs), Avg:98.3 F (36.8 C), Min:97.9 F (36.6 C), Max:99 F (37.2 C)   Recent Labs Lab 11/24/16 1822 11/27/16 0730 11/28/16 1617 11/28/16 1646 11/29/16 0426 11/29/16 1045  WBC  --  10.1 10.8*  --  11.8*  --   CREATININE 0.71 0.63 0.71  --  0.67  --   LATICACIDVEN  --   --   --  2.1*  --  1.4    Normalized CrCl 2665mls/min  Estimated Creatinine Clearance: 69.2 mL/min (by C-G formula based on SCr of 0.67 mg/dL).    No Known Allergies  Antimicrobials this admission: Unasyn 2/22>>  Vancomycin 2/21 >> 2/22  Cefepime 2/21>>2/22 Zosyn 2/21 x 1 in ED  Dose adjustments this admission: N/A  Microbiology results: 2/21 BCx: ngtd 2/1 MRSA PCR: negative  Thank you for allowing pharmacy to be a part of this patient's care.  Elder CyphersLorie Lenka Zhao, BS Loura Backharm D, [place] Clinical Pharmacist Pager [phone] 11/29/2016 3:16 PM

## 2016-11-29 NOTE — Consult Note (Signed)
CARDIOLOGY CONSULT NOTE   Patient ID: Yvonne Chan MRN: [id number] DOB/AGE: Feb 26, 1929 83 y.o.  Admit Date: 11/28/2016 Referring Physician: TRH-Hernandez MD Primary Physician: Abran Richard, PA-C Consulting Cardiologist: Prentice Docker MD Primary Cardiologist: New Reason for Consultation:   Clinical Summary Yvonne Chan is a 83 y.o.female with reported history of HTN, PAF on Eliquis, chronic diastolic CHF, and hypothyroidism who is a resident of the [facility], who presented to ER with complaints of edema, dyspnea and racing heart rate. She has been recently hospitalized in January of 2018,for influenza and CAP, and again this month for influenza.    On arrival to ER, she was found to be hypotensive, 099/62, HR 112 bpm with EKG indicating atrial fib with RVR, HR 122 bpm. Pertinent labs Na 133, glucose 135, Creatinine  0.71. Leukocytosis, with WBC 10.8, BNP 240.0  CXR was non-specific, reporting pneumonia or possibly mild asymmetric interstitial edema, with underlying vascular congestion and mild cardiomegaly.   She was treated with diltiazem IV bolus and started on a gtt. Also started on IV vancomycin and Zosyn. She is now admitted to ICU. She is confused and is a poor historian, therefore history is obtained from current and past medical history.   No Known Allergies  Medications Scheduled Medications: . acidophilus  1 capsule Oral Daily  . apixaban  5 mg Oral BID  . ceFEPime (MAXIPIME) IV  2 g Intravenous Q8H  . furosemide  20 mg Intravenous BID  . guaiFENesin  600 mg Oral BID  . levothyroxine  50 mcg Oral QAC breakfast  . omega-3 acid ethyl esters  1 g Oral Daily  . potassium chloride  10 mEq Oral Daily  . sodium chloride  1 g Oral BID WC  . vancomycin  750 mg Intravenous Q12H     Infusions: . diltiazem (CARDIZEM) infusion 10 mg/hr (11/29/16 0118)     PRN Medications:  acetaminophen, ipratropium-albuterol, ondansetron (ZOFRAN) IV   Past Medical History:    Diagnosis Date  . Atrial fibrillation (HCC)   . Essential hypertension   . Hypothyroidism   . Lumbago   . Neuralgia   . Neuritis     Past Surgical History:  Procedure Laterality Date  . ABDOMINAL HYSTERECTOMY    . APPENDECTOMY      History reviewed. No pertinent family history.   Social History Yvonne Chan reports that she has never smoked. She has never used smokeless tobacco. Yvonne Chan reports that she does not drink alcohol.  Review of Systems Complete review of systems are found to be negative unless outlined in H&P above.  Physical Examination Blood pressure (!) 81/54, pulse (!) 101, temperature 98.7 F (37.1 C), temperature source Oral, resp. rate (!) 21, height 5\' 11"  (1.803 m), weight 235 lb 7.2 oz (106.8 kg), SpO2 98 %.  Intake/Output Summary (Last 24 hours) at 11/29/16 0908 Last data filed at 11/29/16 0500  Gross per 24 hour  Intake           827.75 ml  Output             1450 ml  Net          -622.25 ml    Telemetry: Atrial fib with one 1.26 second pause in the setting of O2 desatruation. HR is in the 70's to 90's.   GEN: Responsive, confused.  HEENT: Conjunctiva and lids normal, oropharynx clear with moist mucosa. Neck: Supple, no elevated JVP or carotid bruits, no thyromegaly. Lungs: Bilateral crackles with frequent coughing.  Upper airway congestion.  Cardiac: Regular rate and rhythm, no S3 or significant systolic murmur, no pericardial rub. Abdomen: Soft, nontender, no hepatomegaly, bowel sounds present, no guarding or rebound. Extremities: No pitting edema, distal pulses 2+. Skin: Warm and dry. Musculoskeletal: No kyphosis. Neuropsychiatric: No oriented to place, time, poor historian.   Prior Cardiac Testing/Procedures Echocardiogram 11/02/2016  Left ventricle: The cavity size was normal. Wall thickness was   increased in a pattern of moderate LVH. Systolic function was   normal. The estimated ejection fraction was in the range of 60%   to 65%. -  Aortic valve: Mildly calcified annulus. Mildly thickened   leaflets. Valve area (VTI): 2.22 cm^2. Valve area (Vmax): 2.15   cm^2. - Mitral valve: Mildly calcified annulus. Mildly thickened leaflets   . There was mild regurgitation. - Left atrium: The atrium was moderately dilated. - Right ventricle: The cavity size was mildly dilated. - Right atrium: The atrium was moderately dilated. - Technically difficult study.  Lab Results  Basic Metabolic Panel:  Recent Labs Lab 11/24/16 1822 11/27/16 0730 11/28/16 1617 11/29/16 0426  NA 129* 132* 133* 133*  K 3.8 3.7 4.0 3.6  CL 90* 91* 91* 92*  CO2 32 33* 33* 32  GLUCOSE 140* 105* 135* 101*  BUN 20 21* 23* 22*  CREATININE 0.71 0.63 0.71 0.67  CALCIUM 7.7* 7.8* 8.1* 7.9*  MG  --   --   --  1.6*    Liver Function Tests:  Recent Labs Lab 11/24/16 1822 11/28/16 1617  AST 29 32  ALT 17 21  ALKPHOS 64 55  BILITOT 1.0 0.7  PROT 6.4* 6.7  ALBUMIN 2.2* 2.1*    CBC:  Recent Labs Lab 11/27/16 0730 11/28/16 1617 11/29/16 0426  WBC 10.1 10.8* 11.8*  NEUTROABS 8.3* 9.0*  --   HGB 12.6 12.9 12.3  HCT 38.6 40.5 37.3  MCV 92.6 94.0 92.3  PLT 328 375 345    Cardiac Enzymes:  Recent Labs Lab 11/28/16 1617  TROPONINI <0.03    Radiology: Dg Chest Port 1 View  Result Date: 11/28/2016 CLINICAL DATA:  Acute onset of shortness of breath and tachycardia. Right-sided soft tissue edema. Initial encounter. EXAM: PORTABLE CHEST 1 VIEW COMPARISON:  Chest radiograph performed 11/17/2016 FINDINGS: The lungs are well-aerated. Left basilar opacity may reflect a combination of airspace opacification and small left pleural effusion. Underlying vascular congestion is noted. No pneumothorax is seen. Right paratracheal prominence is again noted. If not previously assessed, CT of the chest could be considered for further evaluation on an elective nonemergent basis. The heart is mildly enlarged. No acute osseous abnormalities are seen.  IMPRESSION: 1. Left basilar opacity may reflect a combination of airspace opacification and small left pleural effusion. Pneumonia or possibly mild asymmetric interstitial edema could have such an appearance. 2. Underlying vascular congestion and mild cardiomegaly. 3. Right paratracheal prominence again noted. If not previously assessed, CT of the chest could be considered for further evaluation on an elective nonemergent basis. Electronically Signed   By: Roanna Raider M.D.   On: 11/28/2016 16:35     ECG: Atrial fib with RVR rate of 122 bpm.    Impression and Recommendations  1. Atrial fib with RVR: Multifactorial in the setting of HCAP. CHADS VASC Score of 3, on Eliquis. Now on diltiazem gtt. Uncertain if she is swallowing pills well. Will try to transition to po. Not restarted on metoprolol or amlodipine at this time. Will evaluate her response to oral diltiazem.  2. HCAP: With frequent admissions for same, consider aspiration as potential etiology. Nurses say that they are seeing her swallow pills but with frequent admissions for same, concern about aspiration pneumonia. Will defer to Dr. Ardyth HarpsHernandez to work up. Abx per PCP.   3.  Hypertension: Soft today. Will take her off of IV diltiazem and begin po 30 mg Q 6 hours.    Signed: Bettey MareKathryn M. Lawrence NP AACC  11/29/2016, 9:08 AM Co-Sign MD  The patient was seen and examined, and I agree with the history, physical exam, assessment and plan as documented above, with modifications as noted below. 83 yr old woman admitted with pneumonia (presumably due to aspiration) and rapid atrial fibrillation, anticoagulated with Eliquis. Recently hospitalized for influenza A. Pt's daughter said the patient had a good appetite previously but only wants soft foods.  Echocardiogram 11/02/16 demonstrated normal LV systolic function, EF 60-65%. Also mild MR and moderate biatrial dilatation. BNP mildly elevated at 240. One troponin was normal. Chest xray  showed left basilar opacity suspicious for pneumonia and underlying vascular congestion. Currently on IV vancomycin and cefepime. On IV Lasix 20 mg bid as well. Currently BP low normal, previously hypotensive. Will switch IV diltiazem to oral and monitor HR.   Prentice DockerSuresh Elzena Muston, MD, San Joaquin General HospitalFACC  11/29/2016 10:53 AM

## 2016-11-29 NOTE — Evaluation (Signed)
Clinical/Bedside Swallow Evaluation Patient Details  Name: Yvonne BellingMary Chan MRN: [id number] Date of Birth: [birth year]  Today's Date: 11/29/2016 Time: SLP Start Time (ACUTE ONLY): 1300 SLP Stop Time (ACUTE ONLY): 1330 SLP Time Calculation (min) (ACUTE ONLY): 30 min  Past Medical History:  Past Medical History:  Diagnosis Date  . Atrial fibrillation (HCC)   . Essential hypertension   . Hypothyroidism   . Lumbago   . Neuralgia   . Neuritis    Past Surgical History:  Past Surgical History:  Procedure Laterality Date  . ABDOMINAL HYSTERECTOMY    . APPENDECTOMY     HPI:  Yvonne SneddonMary Walkeris a 83 y.o.femalewith medical history significant ofHTN, PAF on Eliquis, diastolic CHF, and hypothyroidism; [facility] for a fastirregular heartbeat noticed this afternoon by staff. According to family present at bedside she has had increasing swelling in her right arms and bilateral legs. Patient complains of feeling short of breath. Review of records shows that the patient has beenhospitalized multiple times within the last month1/25-1/30, 1/31-2/9, and then 2/9-2/10. Treated for atrial fibrillation, diastolic CHF, influenza, and community-acquired pneumonia. Other associated symptoms include decreased appetite and generalized weakness. Patient denies any chest pain, dysuria, nausea, vomiting, orabdominal pain. Review of records shows the patient weight back on 2/6was 224 pounds. Upon admission into the emergency department patient was seen to be afebrile, pulse 120-140s, respirations 20-34, blood pressure 96/71-131/80, and O2 saturations maintained at room air. Lab work revealed WBC 10.8, sodium 133, chloride 91, CO2 33, BUN 23, creatinine 0.71, calcium 8.1, glucose 135, troponin 0.03, andBNP 240. Due to the patient's elevated heart rates she was placed on a diltiazem drip. TRH called to admit. Recommended empiric antibiotics of vancomycin and Zosyn for possibility of a healthcare  associated pneumonia. SLP ordered for BSE due to concerns about aspiration risk.   Assessment / Plan / Recommendation Clinical Impression  Pt presents with positive signs/symptoms of aspiration at bedside with thin liquids characterized by congested coughing following cup and straw sips. Pt with wet, congested cough prior to po so it is difficult to determine whether cough is related to po intake. Given numerous hospitalizations and suspicious chest x-ray findings, objective assessment via MBSS is prudent. Pt's daughter states that Pt was living at home up until recent hospitalizations and typically ate her meals up in a chair. MBSS to be completed shortly. SLP Visit Diagnosis: Dysphagia, oral phase (R13.11)    Aspiration Risk  Mild aspiration risk    Diet Recommendation  Pending MBSS later today        Other  Recommendations     Follow up Recommendations        Frequency and Duration            Prognosis Prognosis for Safe Diet Advancement: Fair Barriers to Reach Goals: Behavior      Swallow Study   General Date of Onset: 11/28/16 HPI: Yvonne SneddonMary Walkeris a 83 y.o.femalewith medical history significant ofHTN, PAF on Eliquis, diastolic CHF, and hypothyroidism; [facility] for a fastirregular heartbeat noticed this afternoon by staff. According to family present at bedside she has had increasing swelling in her right arms and bilateral legs. Patient complains of feeling short of breath. Review of records shows that the patient has beenhospitalized multiple times within the last month1/25-1/30, 1/31-2/9, and then 2/9-2/10. Treated for atrial fibrillation, diastolic CHF, influenza, and community-acquired pneumonia. Other associated symptoms include decreased appetite and generalized weakness. Patient denies any chest pain, dysuria, nausea, vomiting,  orabdominal pain. Review of records shows the patient weight back on 2/6was 224 pounds. Upon admission into the  emergency department patient was seen to be afebrile, pulse 120-140s, respirations 20-34, blood pressure 96/71-131/80, and O2 saturations maintained at room air. Lab work revealed WBC 10.8, sodium 133, chloride 91, CO2 33, BUN 23, creatinine 0.71, calcium 8.1, glucose 135, troponin 0.03, andBNP 240. Due to the patient's elevated heart rates she was placed on a diltiazem drip. TRH called to admit. Recommended empiric antibiotics of vancomycin and Zosyn for possibility of a healthcare associated pneumonia. SLP ordered for BSE due to concerns about aspiration risk. Type of Study: Bedside Swallow Evaluation Previous Swallow Assessment: None on record Diet Prior to this Study: Regular;Thin liquids Temperature Spikes Noted: No Respiratory Status: Room air History of Recent Intubation: No Behavior/Cognition: Alert Oral Cavity Assessment:  (lingual coating) Oral Care Completed by SLP: Recent completion by staff Oral Cavity - Dentition: Missing dentition;Dentures, top Vision: Functional for self-feeding Self-Feeding Abilities: Needs assist Patient Positioning: Upright in bed Baseline Vocal Quality: Wet;Low vocal intensity Volitional Cough: Strong;Congested (unproductive) Volitional Swallow: Able to elicit    Oral/Motor/Sensory Function Overall Oral Motor/Sensory Function: Mild impairment Facial ROM: Within Functional Limits Facial Symmetry: Within Functional Limits Facial Strength: Reduced right;Reduced left Facial Sensation: Within Functional Limits   Ice Chips Ice chips: Within functional limits Presentation: Spoon   Thin Liquid Thin Liquid: Impaired (delayed swallow timing) Presentation: Spoon;Cup;Straw Oral Phase Impairments: Reduced lingual movement/coordination Oral Phase Functional Implications: Prolonged oral transit;Oral residue (Mild oral residue) Pharyngeal  Phase Impairments: Wet Vocal Quality;Cough - Delayed    Nectar Thick Nectar Thick Liquid: Not tested   Honey Thick Honey  Thick Liquid: Not tested   Puree Puree: Within functional limits Presentation: Spoon   Solid   GO   Solid: Not tested    Functional Assessment Tool Used: clinical judgment Functional Limitations: Swallowing Swallow Current Status (Z6109): At least 20 percent but less than 40 percent impaired, limited or restricted Swallow Goal Status [phone]): At least 1 percent but less than 20 percent impaired, limited or restricted  Thank you,  Havery Moros, CCC-SLP [phone]  Santina Trillo 11/29/2016,1:40 PM

## 2016-11-29 NOTE — Progress Notes (Signed)
Modified Barium Swallow Progress Note  Patient Details  Name: Yvonne Chan MRN: [id number] Date of Birth: Jul 23, 1929  Today's Date: 11/29/2016  Modified Barium Swallow completed.  Full report located under Chart Review in the Imaging Section.  Brief recommendations include the following:  Clinical Impression  Pt presents with mild/mod oral phase, mild pharyngeal phase, and suspected primary esophageal phase dysphagia. Oral phase is marked by reduced lingual manipulation of bolus resulting in decreased bolus cohesiveness with decreased anterior posterior oral transit, premature spillage over base of tongue with liquids, piecemeal deglutition with liquid oral residues after initial/primary swallow. Swallow initiation is triggered after spilling/filling the pyriforms with cup/straw sips of thin and cup sips of NTL. Pt demonstrates adequate hyolaryngeal excursion, epiglottic deflection, and airway protection with no penetration or aspiration observed over course of study. Pt does have oral residuals after initial bolus/swallow of liquids which trickle to valleculae and pyriforms after the primary swallow which pt does not immediately sense, however these were never penetrated or aspirated when SLP observed without cuing her to swallow to clear. She does eventually spontaneously clear, however these residuals do pose a risk for penetration/aspiration. These residuals in pharynx can be cleared with caregiver cue to swallow two times for each sip of liquid. Esophageal sweep revealed stasis of barium/bolus in distal third of esophagus likely due to dysmotility. The barium tablet traversed esophagus and through LES without significant delay. Occasionally, backflow of bolus noted to move from cervical esophagus to UES, but was not noted to reflux back into pyriforms. Pt's greatest risks for aspiration appear related to liquid oral residuals pooling in pharynx after the primary swallow and also stasis in esophagus  (threat of laryngopharyngeal reflux), however not observed today. Risks can be minimized by consuming soft textures (ok for mech soft however pt seems to prefer puree clinically) and thin liquids in upright position and remaining upright at least 30 minutes after meals with caregiver cues to swallow twice for each sip (whether cup or straw). Pt was assessed with nectar-thick liquids and found not to be better tolerated than thins. Results of MBSS reviewed with Pt's daughter.   Swallow Evaluation Recommendations       SLP Diet Recommendations: Dysphagia 2 (Fine chop) solids;Thin liquid   Liquid Administration via: Cup;Straw   Medication Administration: Whole meds with liquid   Supervision: Full supervision/cueing for compensatory strategies;Full assist for feeding   Compensations: Slow rate;Multiple dry swallows after each bite/sip (ensure clear oral cavity after meals)   Postural Changes: Remain semi-upright after after feeds/meals (Comment);Seated upright at 90 degrees   Oral Care Recommendations: Oral care BID;Staff/trained caregiver to provide oral care (oral care after meals or swab to ensure clear oral cavity)   Other Recommendations: Clarify dietary restrictions   Thank you,  Havery MorosDabney Emiliya Chretien, CCC-SLP [phone]   Jeremey Bascom 11/29/2016,8:10 PM

## 2016-11-30 DIAGNOSIS — J690 Pneumonitis due to inhalation of food and vomit: Secondary | ICD-10-CM

## 2016-11-30 LAB — BASIC METABOLIC PANEL
ANION GAP: 9 (ref 5–15)
BUN: 20 mg/dL (ref 6–20)
CO2: 32 mmol/L (ref 22–32)
Calcium: 7.7 mg/dL — ABNORMAL LOW (ref 8.9–10.3)
Chloride: 92 mmol/L — ABNORMAL LOW (ref 101–111)
Creatinine, Ser: 0.61 mg/dL (ref 0.44–1.00)
GFR calc Af Amer: >60 mL/min (ref >60)
GLUCOSE: 111 mg/dL — AB (ref 65–99)
POTASSIUM: 3.6 mmol/L (ref 3.5–5.1)
Sodium: 133 mmol/L — ABNORMAL LOW (ref 135–145)

## 2016-11-30 MED ORDER — AMIODARONE HCL IN DEXTROSE 360-4.14 MG/200ML-% IV SOLN
30.0000 mg/h | INTRAVENOUS | Status: DC
Start: 2016-11-30 — End: 2016-12-03
  Administered 2016-11-30 – 2016-12-03 (×7): 30 mg/h via INTRAVENOUS
  Filled 2016-11-30 (×6): qty 200

## 2016-11-30 MED ORDER — AMIODARONE LOAD VIA INFUSION
150.0000 mg | Freq: Once | INTRAVENOUS | Status: AC
  Administered 2016-11-30: 150 mg via INTRAVENOUS
  Filled 2016-11-30: qty 83.34

## 2016-11-30 MED ORDER — AMIODARONE HCL IN DEXTROSE 360-4.14 MG/200ML-% IV SOLN
60.0000 mg/h | INTRAVENOUS | Status: AC
  Administered 2016-11-30 (×2): 60 mg/h via INTRAVENOUS
  Filled 2016-11-30 (×2): qty 200

## 2016-11-30 NOTE — Care Management Important Message (Signed)
Important Message  Patient Details  Name: Yvonne BellingMary Register MRN: [id number] Date of Birth: Oct 29, 1928   Medicare Important Message Given:  Yes    Jazzma Neidhardt, Chrystine OilerSharley Diane, RN 11/30/2016, 3:31 PM

## 2016-11-30 NOTE — Progress Notes (Signed)
Progress Note  Patient Name: Yvonne Chan Date of Encounter: 11/30/2016  Primary Cardiologist: Prentice Docker MD  Subjective   Continues pleasantly confused. She denies pain.   Inpatient Medications    Scheduled Meds: . acidophilus  1 capsule Oral Daily  . ampicillin-sulbactam (UNASYN) IV  3 g Intravenous Q6H  . apixaban  5 mg Oral BID  . diltiazem  30 mg Oral Q6H  . furosemide  20 mg Intravenous BID  . guaiFENesin  600 mg Oral BID  . levothyroxine  50 mcg Oral QAC breakfast  . omega-3 acid ethyl esters  1 g Oral Daily  . potassium chloride  10 mEq Oral Daily  . sodium chloride  1 g Oral BID WC   Continuous Infusions:  PRN Meds: acetaminophen, ipratropium-albuterol, ondansetron (ZOFRAN) IV   Vital Signs    Vitals:   11/30/16 0300 11/30/16 0400 11/30/16 0500 11/30/16 0600  BP: 115/69 109/75 111/77 110/61  Pulse: 98  (!) 128 (!) 111  Resp: (!) 25 (!) 29 (!) 33 (!) 28  Temp:  98.3 F (36.8 C)    TempSrc:  Oral    SpO2: 96%  96% 98%  Weight:   235 lb 3.7 oz (106.7 kg)   Height:        Intake/Output Summary (Last 24 hours) at 11/30/16 0836 Last data filed at 11/30/16 0507  Gross per 24 hour  Intake             1395 ml  Output             2150 ml  Net             -755 ml   Filed Weights   11/28/16 2054 11/29/16 0500 11/30/16 0500  Weight: 238 lb 15.7 oz (108.4 kg) 235 lb 7.2 oz (106.8 kg) 235 lb 3.7 oz (106.7 kg)    Telemetry     Atrial fib rates 100-120 bpm - Personally Reviewed  ECG    - Personally Reviewed  Physical Exam   GEN: No acute distress.   Neck: No JVD Cardiac: IRRR, tachycardic, no murmurs, rubs, or gallops.  Respiratory: Bilatral wheezes, upper airway congestion.  GI: Soft, nontender, non-distended  MS: Right arm lymphedema; No deformity. Neuro:  Nonfocal  Psych: Normal affect,not oriented to time, place, person, or date   Labs    Chemistry Recent Labs Lab 11/24/16 1822  11/28/16 1617 11/29/16 0426 11/30/16 0419  NA  129*  < > 133* 133* 133*  K 3.8  < > 4.0 3.6 3.6  CL 90*  < > 91* 92* 92*  CO2 32  < > 33* 32 32  GLUCOSE 140*  < > 135* 101* 111*  BUN 20  < > 23* 22* 20  CREATININE 0.71  < > 0.71 0.67 0.61  CALCIUM 7.7*  < > 8.1* 7.9* 7.7*  PROT 6.4*  --  6.7  --   --   ALBUMIN 2.2*  --  2.1*  --   --   AST 29  --  32  --   --   ALT 17  --  21  --   --   ALKPHOS 64  --  55  --   --   BILITOT 1.0  --  0.7  --   --   GFRNONAA >60  < > >60 >60 >60  GFRAA >60  < > >60 >60 >60  ANIONGAP 7  < > 9 9 9   < > = values  in this interval not displayed.   Hematology Recent Labs Lab 11/27/16 0730 11/28/16 1617 11/29/16 0426  WBC 10.1 10.8* 11.8*  RBC 4.17 4.31 4.04  HGB 12.6 12.9 12.3  HCT 38.6 40.5 37.3  MCV 92.6 94.0 92.3  MCH 30.2 29.9 30.4  MCHC 32.6 31.9 33.0  RDW 13.9 13.9 13.9  PLT 328 375 345    Cardiac Enzymes Recent Labs Lab 11/28/16 1617  TROPONINI <0.03   No results for input(s): TROPIPOC in the last 168 hours.   BNP Recent Labs Lab 11/27/16 0730 11/28/16 1617  BNP 258.0* 240.0*     DDimer No results for input(s): DDIMER in the last 168 hours.   Radiology    US Venous Img Upper Uni Right  Result Date: 11/29/2016 CLINICAL DATA:  Right arm swelling EXAM: Right UPPER EXTREMITY VENOUS DOPPLER ULTRASOUND TECHNIQUE: Gray-scale sonography with graded compression, as well as color Doppler and duplex ultrasound were performed to evaluate the upper extremity deep venous system from the level of the subclavian vein and including the jugular, axillary, basilic, radial, ulnar and upper cephalic vein. Spectral Doppler was utilized to evaluate flow at rest and with distal augmentation maneuvers. COMPARISON:  None. FINDINGS: Contralateral Subclavian Vein: Respiratory phasicity is normal and symmetric with the symptomatic side. No evidence of thrombus. Normal compressibility. Internal Jugular Vein: No evidence of thrombus. Normal compressibility, respiratory phasicity and response to  augmentation. Subclavian Vein: No evidence of thrombus. Normal compressibility, respiratory phasicity and response to augmentation. Axillary Vein: No evidence of thrombus. Normal compressibility, respiratory phasicity and response to augmentation. Cephalic Vein: No evidence of thrombus. Normal compressibility, respiratory phasicity and response to augmentation. Basilic Vein: No evidence of thrombus. Normal compressibility, respiratory phasicity and response to augmentation. Brachial Veins: No evidence of thrombus. Normal compressibility, respiratory phasicity and response to augmentation. Radial Veins: No evidence of thrombus. Normal compressibility, respiratory phasicity and response to augmentation. Ulnar Veins: No evidence of thrombus. Normal compressibility, respiratory phasicity and response to augmentation. Venous Reflux:  None visualized. Other Findings:  None visualized. IMPRESSION: No evidence of deep venous thrombosis. Electronically Signed   By: Marlan Palau M.D.   On: 11/29/2016 11:58   Dg Chest Port 1 View  Result Date: 11/28/2016 CLINICAL DATA:  Acute onset of shortness of breath and tachycardia. Right-sided soft tissue edema. Initial encounter. EXAM: PORTABLE CHEST 1 VIEW COMPARISON:  Chest radiograph performed 11/17/2016 FINDINGS: The lungs are well-aerated. Left basilar opacity may reflect a combination of airspace opacification and small left pleural effusion. Underlying vascular congestion is noted. No pneumothorax is seen. Right paratracheal prominence is again noted. If not previously assessed, CT of the chest could be considered for further evaluation on an elective nonemergent basis. The heart is mildly enlarged. No acute osseous abnormalities are seen. IMPRESSION: 1. Left basilar opacity may reflect a combination of airspace opacification and small left pleural effusion. Pneumonia or possibly mild asymmetric interstitial edema could have such an appearance. 2. Underlying vascular  congestion and mild cardiomegaly. 3. Right paratracheal prominence again noted. If not previously assessed, CT of the chest could be considered for further evaluation on an elective nonemergent basis. Electronically Signed   By: Roanna Raider M.D.   On: 11/28/2016 16:35   Dg Swallowing Func-speech Pathology  Result Date: 11/29/2016 Objective Swallowing Evaluation: Type of Study: MBS-Modified Barium Swallow Study Patient Details Name: Annina Piotrowski MRN: [id number] Date of Birth: 21-Feb-1929 Today's Date: 11/29/2016 Time: SLP Start Time (ACUTE ONLY): 1337-SLP Stop Time (ACUTE ONLY): 1405 SLP Time Calculation (min) (  ACUTE ONLY): 28 min Past Medical History: Past Medical History: Diagnosis Date . Atrial fibrillation (HCC)  . Essential hypertension  . Hypothyroidism  . Lumbago  . Neuralgia  . Neuritis  Past Surgical History: Past Surgical History: Procedure Laterality Date . ABDOMINAL HYSTERECTOMY   . APPENDECTOMY   HPI: Arelia SneddonMary Walkeris a 83 y.o.femalewith medical history significant ofHTN, PAF on Eliquis, diastolic CHF, and hypothyroidism; who [facility] for a fastirregular heartbeat noticed this afternoon by staff. According to family present at bedside she has had increasing swelling in her right arms and bilateral legs. Patient complains of feeling short of breath. Review of records shows that the patient has beenhospitalized multiple times within the last month1/25-1/30, 1/31-2/9, and then 2/9-2/10. Treated for atrial fibrillation, diastolic CHF, influenza, and community-acquired pneumonia. Other associated symptoms include decreased appetite and generalized weakness. Patient denies any chest pain, dysuria, nausea, vomiting, orabdominal pain. Review of records shows the patient weight back on 2/6was 224 pounds. Upon admission into the emergency department patient was seen to be afebrile, pulse 120-140s, respirations 20-34, blood pressure 96/71-131/80, and O2 saturations maintained at room  air. Lab work revealed WBC 10.8, sodium 133, chloride 91, CO2 33, BUN 23, creatinine 0.71, calcium 8.1, glucose 135, troponin 0.03, andBNP 240. Due to the patient's elevated heart rates she was placed on a diltiazem drip. TRH called to admit. Recommended empiric antibiotics of vancomycin and Zosyn for possibility of a healthcare associated pneumonia. SLP ordered for BSE due to concerns about aspiration risk. Subjective: "Okay." Assessment / Plan / Recommendation CHL IP CLINICAL IMPRESSIONS 11/29/2016 Clinical Impression Pt presents with mild/mod oral phase, mild pharyngeal phase, and suspected primary esophageal phase dysphagia. Oral phase is marked by reduced lingual manipulation of bolus resulting in decreased bolus cohesiveness with decreased anterior posterior oral transit, premature spillage over base of tongue with liquids, piecemeal deglutition with liquid oral residues after initial/primary swallow. Swallow initiation is triggered after spilling/filling the pyriforms with cup/straw sips of thin and cup sips of NTL. Pt demonstrates adequate hyolaryngeal excursion, epiglottic deflection, and airway protection with no penetration or aspiration observed over course of study. Pt does have oral residuals after initial bolus/swallow of liquids which trickle to valleculae and pyriforms after the primary swallow which pt does not immediately sense, however these were never penetrated or aspirated when SLP observed without cuing her to swallow to clear. She does eventually spontaneously clear, however these residuals do pose a risk for penetration/aspiration. These residuals in pharynx can be cleared with caregiver cue to swallow two times for each sip of liquid. Esophageal sweep revealed stasis of barium/bolus in distal third of esophagus likely due to dysmotility. The barium tablet traversed esophagus and through LES without significant delay. Occasionally, backflow of bolus noted to move from cervical esophagus to  UES, but was not noted to reflux back into pyriforms. Pt's greatest risks for aspiration appear related to liquid oral residuals pooling in pharynx after the primary swallow and also stasis in esophagus (threat of laryngopharyngeal reflux), however not observed today. Risks can be minimized by consuming soft textures (ok for mech soft however pt seems to prefer puree clinically) and thin liquids in upright position and remaining upright at least 30 minutes after meals with caregiver cues to swallow twice for each sip (whether cup or straw). Pt was assessed with nectar-thick liquids and found not to be better tolerated than thins. Results of MBSS reviewed with Pt's daughter. SLP Visit Diagnosis Dysphagia, oropharyngeal phase (R13.12);Dysphagia, pharyngeal phase (R13.13) Attention  and concentration deficit following -- Frontal lobe and executive function deficit following -- Impact on safety and function Mild aspiration risk   CHL IP TREATMENT RECOMMENDATION 11/29/2016 Treatment Recommendations Therapy as outlined in treatment plan below   Prognosis 11/29/2016 Prognosis for Safe Diet Advancement Fair Barriers to Reach Goals Behavior Barriers/Prognosis Comment -- CHL IP DIET RECOMMENDATION 11/29/2016 SLP Diet Recommendations Dysphagia 2 (Fine chop) solids;Thin liquid Liquid Administration via Cup;Straw Medication Administration Whole meds with liquid Compensations Slow rate;Multiple dry swallows after each bite/sip Postural Changes Remain semi-upright after after feeds/meals (Comment);Seated upright at 90 degrees   CHL IP OTHER RECOMMENDATIONS 11/29/2016 Recommended Consults -- Oral Care Recommendations Oral care BID;Staff/trained caregiver to provide oral care Other Recommendations Clarify dietary restrictions   CHL IP FOLLOW UP RECOMMENDATIONS 11/29/2016 Follow up Recommendations Skilled Nursing facility   [facility] IP FREQUENCY AND DURATION 11/29/2016 Speech Therapy Frequency (ACUTE ONLY) min 2x/week Treatment Duration 1 week       CHL IP ORAL PHASE 11/29/2016 Oral Phase Impaired Oral - Pudding Teaspoon -- Oral - Pudding Cup -- Oral - Honey Teaspoon -- Oral - Honey Cup -- Oral - Nectar Teaspoon -- Oral - Nectar Cup Reduced posterior propulsion;Lingual/palatal residue;Delayed oral transit Oral - Nectar Straw -- Oral - Thin Teaspoon -- Oral - Thin Cup Reduced posterior propulsion;Lingual/palatal residue;Piecemeal swallowing;Delayed oral transit;Premature spillage Oral - Thin Straw -- Oral - Puree WFL;Delayed oral transit Oral - Mech Soft Impaired mastication;Reduced posterior propulsion;Piecemeal swallowing;Delayed oral transit Oral - Regular -- Oral - Multi-Consistency -- Oral - Pill -- Oral Phase - Comment --  CHL IP PHARYNGEAL PHASE 11/29/2016 Pharyngeal Phase Impaired Pharyngeal- Pudding Teaspoon -- Pharyngeal -- Pharyngeal- Pudding Cup -- Pharyngeal -- Pharyngeal- Honey Teaspoon -- Pharyngeal -- Pharyngeal- Honey Cup -- Pharyngeal -- Pharyngeal- Nectar Teaspoon -- Pharyngeal -- Pharyngeal- Nectar Cup Pharyngeal residue - valleculae;Pharyngeal residue - pyriform;Delayed swallow initiation-pyriform sinuses Pharyngeal -- Pharyngeal- Nectar Straw -- Pharyngeal -- Pharyngeal- Thin Teaspoon Delayed swallow initiation-pyriform sinuses Pharyngeal -- Pharyngeal- Thin Cup Delayed swallow initiation-pyriform sinuses;Pharyngeal residue - valleculae;Pharyngeal residue - pyriform Pharyngeal -- Pharyngeal- Thin Straw Delayed swallow initiation-pyriform sinuses;Pharyngeal residue - valleculae;Pharyngeal residue - pyriform Pharyngeal -- Pharyngeal- Puree Delayed swallow initiation-vallecula Pharyngeal -- Pharyngeal- Mechanical Soft Delayed swallow initiation-vallecula;Pharyngeal residue - cp segment Pharyngeal -- Pharyngeal- Regular -- Pharyngeal -- Pharyngeal- Multi-consistency -- Pharyngeal -- Pharyngeal- Pill -- Pharyngeal -- Pharyngeal Comment pharynx clear after primary bolus swallow, but oral residuals (liquids) pool into valleculae and pyriforms  after the swallow  CHL IP CERVICAL ESOPHAGEAL PHASE 11/29/2016 Cervical Esophageal Phase Impaired Pudding Teaspoon -- Pudding Cup -- Honey Teaspoon -- Honey Cup -- Nectar Teaspoon -- Nectar Cup -- Nectar Straw -- Thin Teaspoon -- Thin Cup -- Thin Straw -- Puree -- Mechanical Soft Esophageal backflow into cervical esophagus Regular -- Multi-consistency -- Pill -- Cervical Esophageal Comment mild retention/backflow into UES from cervical esophagus CHL IP GO 11/29/2016 Functional Assessment Tool Used clinical judgment Functional Limitations Swallowing Swallow Current Status (Z6109) CJ Swallow Goal Status (U0454) CI Swallow Discharge Status (U9811) (None) Motor Speech Current Status (B1478) (None) Motor Speech Goal Status (G9562) (None) Motor Speech Goal Status (Z3086) (None) Spoken Language Comprehension Current Status (V7846) (None) Spoken Language Comprehension Goal Status (N6295) (None) Spoken Language Comprehension Discharge Status (M8413) (None) Spoken Language Expression Current Status (K4401) (None) Spoken Language Expression Goal Status (U2725) (None) Spoken Language Expression Discharge Status (D6644) (None) Attention Current Status (I3474) (None) Attention Goal Status (Q5956) (None) Attention Discharge Status (L8756) (None) Memory Current Status (E3329) (None) Memory Goal Status (J1884) (  None) Memory Discharge Status [phone]) (None) Voice Current Status [phone]) (None) Voice Goal Status (X9147) (None) Voice Discharge Status [phone]) (None) Other Speech-Language Pathology Functional Limitation [phone]) (None) Other Speech-Language Pathology Functional Limitation Goal Status (M5784) (None) Other Speech-Language Pathology Functional Limitation Discharge Status [phone]) (None) Thank you, Havery Moros, CCC-SLP [phone] PORTER,DABNEY 11/29/2016, 8:32 PM               Cardiac Studies  Echocardiogram 11/02/2016 Prior Cardiac Testing/Procedures Echocardiogram 11/02/2016  Left ventricle: The cavity size was normal.  Wall thickness was increased in a pattern of moderate LVH. Systolic function was normal. The estimated ejection fraction was in the range of 60% to 65%. - Aortic valve: Mildly calcified annulus. Mildly thickened leaflets. Valve area (VTI): 2.22 cm^2. Valve area (Vmax): 2.15 cm^2. - Mitral valve: Mildly calcified annulus. Mildly thickened leaflets . There was mild regurgitation. - Left atrium: The atrium was moderately dilated. - Right ventricle: The cavity size was mildly dilated. - Right atrium: The atrium was moderately dilated. - Technically difficult study.   Patient Profile     83 y.o. female  with reported history of HTN, PAF on Eliquis, chronic diastolic CHF, and hypothyroidism who is a resident of the [facility], who presented to ER with complaints of edema, dyspnea and racing heart rate. She has been recently hospitalized in January of 2018,for influenza and CAP, and again this month for influenza. Found to have afib with RVR.   Assessment & Plan    1. Atrial fib with RVR: Heart rate is not well controlled. Currently on diltiazem at 30 mg po Q 8 hours. BP is soft. EF normal. Creatinine 0.61. Consider adding digoxin.Creatinine clearance 114 ml/min. Continues on Eliquis 5 mg BID.   2. HCAP: Frequent admissions for same. Swallowing study revealed mild aspiration risk. Treatment per PCP.   3. Hypertension: Continues soft but improved from yesterday. Will not adjust diltiazem at this time.   4. Diastolic CHF: Has diuresed with total output 2. 150 cc. Weight does to appear to be accurate.     Signed, Joni Reining, NP  11/30/2016, 8:36 AM    The patient was seen and examined, and I agree with the history, physical exam, assessment and plan as documented above, with modifications as noted below.  83 yr old woman admitted with pneumonia (presumably due to aspiration) and rapid atrial fibrillation, anticoagulated with Eliquis. Recently hospitalized for influenza  A.  Echocardiogram 11/02/16 demonstrated normal LV systolic function, EF 60-65%. Also mild MR and moderate biatrial dilatation. BNP mildly elevated at 240. One troponin was normal. Chest xray showed left basilar opacity suspicious for pneumonia and underlying vascular congestion. Currently on IV Unasyn. On IV Lasix 20 mg bid as well. BP remains low normal. On oral diltiazem 30 mg q 6 hrs with inadequate HR control.  I will stop diltiazem and attempt IV amiodarone for rate control and perhaps chemical cardioversion.   Prentice Docker, MD, [facility]  11/30/2016 10:53 AM

## 2016-11-30 NOTE — Plan of Care (Signed)
Problem: Skin Integrity: Goal: Risk for impaired skin integrity will decrease Outcome: Progressing Patient will be turned every 2 hours to prevent pressure ulcer formation.

## 2016-11-30 NOTE — Clinical Social Work Note (Signed)
Clinical Social Work Assessment  Patient Details  Name: Yvonne BellingMary Chan MRN: [id number] Date of Birth: April 12, 1929  Date of referral:                  Reason for consult:  Discharge Planning, Legal Concerns                Permission sought to share information with:    Permission granted to share information::     Name::     Jacobo ForestJerry Ditmars, son, listed on chart  Agency::     Relationship::     Contact Information:     Housing/Transportation Living arrangements for the past 2 months:  Single Family Home, Skilled Nursing Facility Source of Information:  Patient, Adult Children Patient Interpreter Needed:  None Criminal Activity/Legal Involvement Pertinent to Current Situation/Hospitalization:  No - Comment as needed Significant Relationships:  Adult Children Lives with:  Adult Children, Facility Resident Do you feel safe going back to the place where you live?  Yes Need for family participation in patient care:  Yes (Comment)  Care giving concerns:  Facility resident.    Social Worker assessment / plan: [facility]. [facility]  At baseline she was able to ambulate independently.  Her family is very supportive. Patient can return to the facility, no FL2 is needed. Patient's son, Dorene SorrowJerry, advised confirmed facility's statements. He advised that the family wanted patient to go back to the facility.    Employment status:  Retired Database administratornsurance information:  Managed Medicare PT Recommendations:  Not assessed at this time Information / Referral to community resources:     Patient/Family's Response to care:  Family is agreeable for patient to return at discharge.  Patient/Family's Understanding of and Emotional Response to Diagnosis, Current Treatment, and Prognosis: Family understands patient's diagnosis, treatment and prognosis.   Emotional Assessment Appearance:  Appears stated  age Attitude/Demeanor/Rapport:  Unable to Assess Affect (typically observed):  Unable to Assess Orientation:  Oriented to Self, Oriented to Place, Oriented to Situation Alcohol / Substance use:  Not Applicable Psych involvement (Current and /or in the community):  No (Comment)  Discharge Needs  Concerns to be addressed:  Discharge Planning Concerns Readmission within the last 30 days:  Yes Current discharge risk:  Physical Impairment Barriers to Discharge:  No Barriers Identified   Annice NeedySettle, Laruen Risser D, LCSW 11/30/2016, 10:34 AM

## 2016-11-30 NOTE — Progress Notes (Signed)
PT Cancellation Note  Patient Details Name: Yvonne Chan MRN: [id number] DOB: 12/08/1928   Cancelled Treatment:    Reason Eval/Treat Not Completed: Medical issues which prohibited therapy  Held PT session following discussion with RN due to HR at 120/130 at rest following change in medication.  [address]Casey Cockerham, LPTA; CBIS [phone]  Juel BurrowCockerham, Casey Jo 11/30/2016, 5:31 PM

## 2016-11-30 NOTE — Progress Notes (Signed)
PROGRESS NOTE    Yvonne Chan  ZOX:[id number] DOB: September 08, 1929 DOA: 11/28/2016 PCP: Phyllis Ginger     Brief Narrative:  83 year old [facility] after she was noted to have an elevated heart rate. She has also been noticed to have right upper extremity edema and some mild shortness of breath. She was recently hospitalized for influenza and has had a few recent admissions for pneumonia, presumed hospital-acquired. Admission has been requested for further evaluation and management.   Assessment & Plan:   Principal Problem:   A-fib (HCC) Active Problems:   Hypothyroidism   Acute diastolic CHF (congestive heart failure) (HCC)   Hyponatremia   Weakness   Swelling of right upper extremity   Atrial fibrillation with rapid ventricular response -Rates remain uncontrolled in the 120s despite soft blood pressure in the 90s to low 100s. -Discussed with cardiology plan to hold oral Cardizem and place on IV amiodarone. -Patient is anticoagulated on Eliquis and this will be continued.  Right upper extremity edema -Doppler without evidence for DVT.  Hospital-acquired pneumonia/aspiration pneumonia -Has had frequent admissions for same. -I suspect there may be a component of aspiration and have requested a speech therapy evaluation. They're recommending a dysphagia 2 diet. -MRSA PCR is negative, will discontinue vancomycin for now.  -Continue Unasyn to cover aspiration pathogens.  Acute on chronic diastolic CHF -Patient appears mildly volume overloaded on exam. Echo in January 2018 shows an ejection fraction of 60-65%. -Suspect pulmonary edema noted on chest x-ray is likely related to rapid rate with her A. Fib. -Continue low dose Lasix of 20 mg IV twice a day due to her soft blood pressures, she is 1.3 L negative since admission.   DVT prophylaxis: Eliquis Code Status: Full Code Family Communication: patient only Disposition Plan: Keep in  SDU today due to amiodarone drip  Consultants:   Cardiology  Procedures:   None  Antimicrobials:  Anti-infectives    Start     Dose/Rate Route Frequency Ordered Stop   11/29/16 1800  Ampicillin-Sulbactam (UNASYN) 3 g in sodium chloride 0.9 % 100 mL IVPB     3 g 200 mL/hr over 30 Minutes Intravenous Every 6 hours 11/29/16 1519     11/29/16 1000  vancomycin (VANCOCIN) IVPB 750 mg/150 ml premix  Status:  Discontinued     750 mg 150 mL/hr over 60 Minutes Intravenous Every 12 hours 11/28/16 2130 11/29/16 1510   11/29/16 0000  ceFEPIme (MAXIPIME) 2 g in dextrose 5 % 50 mL IVPB  Status:  Discontinued     2 g 100 mL/hr over 30 Minutes Intravenous Every 8 hours 11/28/16 2130 11/29/16 1510   11/28/16 2200  vancomycin (VANCOCIN) IVPB 1000 mg/200 mL premix     1,000 mg 200 mL/hr over 60 Minutes Intravenous  Once 11/28/16 2130 11/28/16 2310   11/28/16 1830  vancomycin (VANCOCIN) IVPB 1000 mg/200 mL premix     1,000 mg 200 mL/hr over 60 Minutes Intravenous  Once 11/28/16 1821 11/28/16 2112   11/28/16 1830  piperacillin-tazobactam (ZOSYN) IVPB 3.375 g     3.375 g 12.5 mL/hr over 240 Minutes Intravenous  Once 11/28/16 1821 11/28/16 1933       Subjective: Alert to person only, appears mildly confused, able to answer simple questions  Objective: Vitals:   11/30/16 0900 11/30/16 1000 11/30/16 1100 11/30/16 1200  BP: 127/87 96/66 105/65 108/69  Pulse: (!) 122 (!) 41 62 (!) 118  Resp: (!) 29 (!) 31 (!)  30 (!) 31  Temp:    98.1 F (36.7 C)  TempSrc:    Oral  SpO2: 91% 97% 95% 99%  Weight:      Height:        Intake/Output Summary (Last 24 hours) at 11/30/16 1656 Last data filed at 11/30/16 1204  Gross per 24 hour  Intake           810.01 ml  Output             1400 ml  Net          -589.99 ml   Filed Weights   11/28/16 2054 11/29/16 0500 11/30/16 0500  Weight: 108.4 kg (238 lb 15.7 oz) 106.8 kg (235 lb 7.2 oz) 106.7 kg (235 lb 3.7 oz)    Examination:  General exam:  awake,  oriented x 1 Respiratory system: Mild bibasilar crackles Cardiovascular system: irregular rate and rhythm, no murmurs, rubs or gallops auscultated Gastrointestinal system: Abdomen is nondistended, soft and nontender. No organomegaly or masses felt. Normal bowel sounds heard. Central nervous system: Moves all 4 spontaneously Extremities: Right greater than left upper extremity edema, 1+ bilateral pitting edema Skin: No rashes, lesions or ulcers    Data Reviewed: I have personally reviewed following labs and imaging studies  CBC:  Recent Labs Lab 11/27/16 0730 11/28/16 1617 11/29/16 0426  WBC 10.1 10.8* 11.8*  NEUTROABS 8.3* 9.0*  --   HGB 12.6 12.9 12.3  HCT 38.6 40.5 37.3  MCV 92.6 94.0 92.3  PLT 328 375 345   Basic Metabolic Panel:  Recent Labs Lab 11/24/16 1822 11/27/16 0730 11/28/16 1617 11/29/16 0426 11/30/16 0419  NA 129* 132* 133* 133* 133*  K 3.8 3.7 4.0 3.6 3.6  CL 90* 91* 91* 92* 92*  CO2 32 33* 33* 32 32  GLUCOSE 140* 105* 135* 101* 111*  BUN 20 21* 23* 22* 20  CREATININE 0.71 0.63 0.71 0.67 0.61  CALCIUM 7.7* 7.8* 8.1* 7.9* 7.7*  MG  --   --   --  1.6*  --    GFR: Estimated Creatinine Clearance: 69.2 mL/min (by C-G formula based on SCr of 0.61 mg/dL). Liver Function Tests:  Recent Labs Lab 11/24/16 1822 11/28/16 1617  AST 29 32  ALT 17 21  ALKPHOS 64 55  BILITOT 1.0 0.7  PROT 6.4* 6.7  ALBUMIN 2.2* 2.1*   No results for input(s): LIPASE, AMYLASE in the last 168 hours. No results for input(s): AMMONIA in the last 168 hours. Coagulation Profile: No results for input(s): INR, PROTIME in the last 168 hours. Cardiac Enzymes:  Recent Labs Lab 11/28/16 1617  TROPONINI <0.03   BNP (last 3 results) No results for input(s): PROBNP in the last 8760 hours. HbA1C: No results for input(s): HGBA1C in the last 72 hours. CBG: No results for input(s): GLUCAP in the last 168 hours. Lipid Profile: No results for input(s): CHOL, HDL, LDLCALC, TRIG,  CHOLHDL, LDLDIRECT in the last 72 hours. Thyroid Function Tests: No results for input(s): TSH, T4TOTAL, FREET4, T3FREE, THYROIDAB in the last 72 hours. Anemia Panel: No results for input(s): VITAMINB12, FOLATE, FERRITIN, TIBC, IRON, RETICCTPCT in the last 72 hours. Urine analysis:    Component Value Date/Time   COLORURINE YELLOW 11/29/2016 0400   APPEARANCEUR CLEAR 11/29/2016 0400   LABSPEC 1.010 11/29/2016 0400   PHURINE 5.5 11/29/2016 0400   GLUCOSEU NEGATIVE 11/29/2016 0400   HGBUR TRACE (A) 11/29/2016 0400   BILIRUBINUR NEGATIVE 11/29/2016 0400   KETONESUR NEGATIVE 11/29/2016 0400  PROTEINUR NEGATIVE 11/29/2016 0400   NITRITE NEGATIVE 11/29/2016 0400   LEUKOCYTESUR NEGATIVE 11/29/2016 0400   Sepsis Labs: @LABRCNTIP (procalcitonin:4,lacticidven:4)  ) Recent Results (from the past 240 hour(s))  Culture, blood (routine x 2)     Status: None (Preliminary result)   Collection Time: 11/28/16  7:57 PM  Result Value Ref Range Status   Specimen Description LEFT ANTECUBITAL  Final   Special Requests BOTTLES DRAWN AEROBIC AND ANAEROBIC 6CC  Final   Culture NO GROWTH 2 DAYS  Final   Report Status PENDING  Incomplete  Culture, blood (routine x 2)     Status: None (Preliminary result)   Collection Time: 11/28/16  8:08 PM  Result Value Ref Range Status   Specimen Description BLOOD LEFT HAND  Final   Special Requests BOTTLES DRAWN AEROBIC ONLY 6CC  Final   Culture NO GROWTH 2 DAYS  Final   Report Status PENDING  Incomplete  MRSA PCR Screening     Status: None   Collection Time: 11/28/16  8:41 PM  Result Value Ref Range Status   MRSA by PCR NEGATIVE NEGATIVE Final    Comment:        The GeneXpert MRSA Assay (FDA approved for NASAL specimens only), is one component of a comprehensive MRSA colonization surveillance program. It is not intended to diagnose MRSA infection nor to guide or monitor treatment for MRSA infections.          Radiology Studies: US Venous Img Upper  Uni Right  Result Date: 11/29/2016 CLINICAL DATA:  Right arm swelling EXAM: Right UPPER EXTREMITY VENOUS DOPPLER ULTRASOUND TECHNIQUE: Gray-scale sonography with graded compression, as well as color Doppler and duplex ultrasound were performed to evaluate the upper extremity deep venous system from the level of the subclavian vein and including the jugular, axillary, basilic, radial, ulnar and upper cephalic vein. Spectral Doppler was utilized to evaluate flow at rest and with distal augmentation maneuvers. COMPARISON:  None. FINDINGS: Contralateral Subclavian Vein: Respiratory phasicity is normal and symmetric with the symptomatic side. No evidence of thrombus. Normal compressibility. Internal Jugular Vein: No evidence of thrombus. Normal compressibility, respiratory phasicity and response to augmentation. Subclavian Vein: No evidence of thrombus. Normal compressibility, respiratory phasicity and response to augmentation. Axillary Vein: No evidence of thrombus. Normal compressibility, respiratory phasicity and response to augmentation. Cephalic Vein: No evidence of thrombus. Normal compressibility, respiratory phasicity and response to augmentation. Basilic Vein: No evidence of thrombus. Normal compressibility, respiratory phasicity and response to augmentation. Brachial Veins: No evidence of thrombus. Normal compressibility, respiratory phasicity and response to augmentation. Radial Veins: No evidence of thrombus. Normal compressibility, respiratory phasicity and response to augmentation. Ulnar Veins: No evidence of thrombus. Normal compressibility, respiratory phasicity and response to augmentation. Venous Reflux:  None visualized. Other Findings:  None visualized. IMPRESSION: No evidence of deep venous thrombosis. Electronically Signed   By: Marlan Palau M.D.   On: 11/29/2016 11:58   Dg Swallowing Func-speech Pathology  Result Date: 11/29/2016 Objective Swallowing Evaluation: Type of Study: MBS-Modified  Barium Swallow Study Patient Details Name: Yvonne Chan MRN: [id number] Date of Birth: 10-02-1929 Today's Date: 11/29/2016 Time: SLP Start Time (ACUTE ONLY): 1337-SLP Stop Time (ACUTE ONLY): 1405 SLP Time Calculation (min) (ACUTE ONLY): 28 min Past Medical History: Past Medical History: Diagnosis Date . Atrial fibrillation (HCC)  . Essential hypertension  . Hypothyroidism  . Lumbago  . Neuralgia  . Neuritis  Past Surgical History: Past Surgical History: Procedure Laterality Date . ABDOMINAL HYSTERECTOMY   . APPENDECTOMY   HPI:  Endia Moncur a 83 y.o.femalewith medical history significant ofHTN, PAF on Eliquis, diastolic CHF, and hypothyroidism; who presents from the [facility] for a fastirregular heartbeat noticed this afternoon by staff. According to family present at bedside she has had increasing swelling in her right arms and bilateral legs. Patient complains of feeling short of breath. Review of records shows that the patient has beenhospitalized multiple times within the last month1/25-1/30, 1/31-2/9, and then 2/9-2/10. Treated for atrial fibrillation, diastolic CHF, influenza, and community-acquired pneumonia. Other associated symptoms include decreased appetite and generalized weakness. Patient denies any chest pain, dysuria, nausea, vomiting, orabdominal pain. Review of records shows the patient weight back on 2/6was 224 pounds. Upon admission into the emergency department patient was seen to be afebrile, pulse 120-140s, respirations 20-34, blood pressure 96/71-131/80, and O2 saturations maintained at room air. Lab work revealed WBC 10.8, sodium 133, chloride 91, CO2 33, BUN 23, creatinine 0.71, calcium 8.1, glucose 135, troponin 0.03, andBNP 240. Due to the patient's elevated heart rates she was placed on a diltiazem drip. TRH called to admit. Recommended empiric antibiotics of vancomycin and Zosyn for possibility of a healthcare associated pneumonia. SLP ordered for BSE due to concerns about  aspiration risk. Subjective: "Okay." Assessment / Plan / Recommendation CHL IP CLINICAL IMPRESSIONS 11/29/2016 Clinical Impression Pt presents with mild/mod oral phase, mild pharyngeal phase, and suspected primary esophageal phase dysphagia. Oral phase is marked by reduced lingual manipulation of bolus resulting in decreased bolus cohesiveness with decreased anterior posterior oral transit, premature spillage over base of tongue with liquids, piecemeal deglutition with liquid oral residues after initial/primary swallow. Swallow initiation is triggered after spilling/filling the pyriforms with cup/straw sips of thin and cup sips of NTL. Pt demonstrates adequate hyolaryngeal excursion, epiglottic deflection, and airway protection with no penetration or aspiration observed over course of study. Pt does have oral residuals after initial bolus/swallow of liquids which trickle to valleculae and pyriforms after the primary swallow which pt does not immediately sense, however these were never penetrated or aspirated when SLP observed without cuing her to swallow to clear. She does eventually spontaneously clear, however these residuals do pose a risk for penetration/aspiration. These residuals in pharynx can be cleared with caregiver cue to swallow two times for each sip of liquid. Esophageal sweep revealed stasis of barium/bolus in distal third of esophagus likely due to dysmotility. The barium tablet traversed esophagus and through LES without significant delay. Occasionally, backflow of bolus noted to move from cervical esophagus to UES, but was not noted to reflux back into pyriforms. Pt's greatest risks for aspiration appear related to liquid oral residuals pooling in pharynx after the primary swallow and also stasis in esophagus (threat of laryngopharyngeal reflux), however not observed today. Risks can be minimized by consuming soft textures (ok for mech soft however pt seems to prefer puree clinically) and thin  liquids in upright position and remaining upright at least 30 minutes after meals with caregiver cues to swallow twice for each sip (whether cup or straw). Pt was assessed with nectar-thick liquids and found not to be better tolerated than thins. Results of MBSS reviewed with Pt's daughter. SLP Visit Diagnosis Dysphagia, oropharyngeal phase (R13.12);Dysphagia, pharyngeal phase (R13.13) Attention and concentration deficit following -- Frontal lobe and executive function deficit following -- Impact on safety and function Mild aspiration risk   CHL IP TREATMENT RECOMMENDATION 11/29/2016 Treatment Recommendations Therapy as outlined in treatment plan below   Prognosis 11/29/2016 Prognosis for Safe Diet Advancement Fair Barriers to Reach Goals Behavior  Barriers/Prognosis Comment -- CHL IP DIET RECOMMENDATION 11/29/2016 SLP Diet Recommendations Dysphagia 2 (Fine chop) solids;Thin liquid Liquid Administration via Cup;Straw Medication Administration Whole meds with liquid Compensations Slow rate;Multiple dry swallows after each bite/sip Postural Changes Remain semi-upright after after feeds/meals (Comment);Seated upright at 90 degrees   CHL IP OTHER RECOMMENDATIONS 11/29/2016 Recommended Consults -- Oral Care Recommendations Oral care BID;Staff/trained caregiver to provide oral care Other Recommendations Clarify dietary restrictions   CHL IP FOLLOW UP RECOMMENDATIONS 11/29/2016 Follow up Recommendations [facility] Nursing [facility] F[facility]QU[facility] D[facility]ATION 11/29/2016 Speech Therapy Frequency (ACUTE ONLY) min 2x/week Treatment Duration 1 week      CHL IP ORAL PHASE 11/29/2016 Oral Phase Impaired Oral - Pudding Teaspoon -- Oral - Pudding Cup -- Oral - Honey Teaspoon -- Oral - Honey Cup -- Oral - Nectar Teaspoon -- Oral - Nectar Cup Reduced posterior propulsion;Lingual/palatal residue;Delayed oral transit Oral - Nectar Straw -- Oral - Thin Teaspoon -- Oral - Thin Cup Reduced posterior propulsion;Lingual/palatal residue;Piecemeal  swallowing;Delayed oral transit;Premature spillage Oral - Thin Straw -- Oral - Puree WFL;Delayed oral transit Oral - Mech Soft Impaired mastication;Reduced posterior propulsion;Piecemeal swallowing;Delayed oral transit Oral - Regular -- Oral - Multi-Consistency -- Oral - Pill -- Oral Phase - Comment --  CHL IP PHARYNGEAL PHASE 11/29/2016 Pharyngeal Phase Impaired Pharyngeal- Pudding Teaspoon -- Pharyngeal -- Pharyngeal- Pudding Cup -- Pharyngeal -- Pharyngeal- Honey Teaspoon -- Pharyngeal -- Pharyngeal- Honey Cup -- Pharyngeal -- Pharyngeal- Nectar Teaspoon -- Pharyngeal -- Pharyngeal- Nectar Cup Pharyngeal residue - valleculae;Pharyngeal residue - pyriform;Delayed swallow initiation-pyriform sinuses Pharyngeal -- Pharyngeal- Nectar Straw -- Pharyngeal -- Pharyngeal- Thin Teaspoon Delayed swallow initiation-pyriform sinuses Pharyngeal -- Pharyngeal- Thin Cup Delayed swallow initiation-pyriform sinuses;Pharyngeal residue - valleculae;Pharyngeal residue - pyriform Pharyngeal -- Pharyngeal- Thin Straw Delayed swallow initiation-pyriform sinuses;Pharyngeal residue - valleculae;Pharyngeal residue - pyriform Pharyngeal -- Pharyngeal- Puree Delayed swallow initiation-vallecula Pharyngeal -- Pharyngeal- Mechanical Soft Delayed swallow initiation-vallecula;Pharyngeal residue - cp segment Pharyngeal -- Pharyngeal- Regular -- Pharyngeal -- Pharyngeal- Multi-consistency -- Pharyngeal -- Pharyngeal- Pill -- Pharyngeal -- Pharyngeal Comment pharynx clear after primary bolus swallow, but oral residuals (liquids) pool into valleculae and pyriforms after the swallow  CHL IP CERVICAL ESOPHAGEAL PHASE 11/29/2016 Cervical Esophageal Phase Impaired Pudding Teaspoon -- Pudding Cup -- Honey Teaspoon -- Honey Cup -- Nectar Teaspoon -- Nectar Cup -- Nectar Straw -- Thin Teaspoon -- Thin Cup -- Thin Straw -- Puree -- Mechanical Soft Esophageal backflow into cervical esophagus Regular -- Multi-consistency -- Pill -- Cervical Esophageal Comment  mild retention/backflow into UES from cervical esophagus CHL IP GO 11/29/2016 Functional Assessment Tool Used clinical judgment Functional Limitations Swallowing Swallow Current Status (Z6109(G8996) CJ Swallow Goal Status (U0454(G8997) CI Swallow Discharge Status (U9811(G8998) (None) Motor Speech Current Status (B1478(G8999) (None) Motor Speech Goal Status (G9562(G9186) (None) Motor Speech Goal Status (Z3086(G9158) (None) Spoken Language Comprehension Current Status (V7846(G9159) (None) Spoken Language Comprehension Goal Status (N6295(G9160) (None) Spoken Language Comprehension Discharge Status (M8413(G9161) (None) Spoken Language Expression Current Status (K4401(G9162) (None) Spoken Language Expression Goal Status (U2725(G9163) (None) Spoken Language Expression Discharge Status [phone]([phone]) (None) Attention Current Status (I3474(G9165) (None) Attention Goal Status (Q5956(G9166) (None) Attention Discharge Status (L8756(G9167) (None) Memory Current Status (E3329(G9168) (None) Memory Goal Status (J1884(G9169) (None) Memory Discharge Status (Z6606(G9170) (None) Voice Current Status (T0160(G9171) (None) Voice Goal Status (F0932(G9172) (None) Voice Discharge Status (T5573(G9173) (None) Other Speech-Language Pathology Functional Limitation (U2025(G9174) (None) Other Speech-Language Pathology Functional Limitation Goal Status (K2706(G9175) (None) Other Speech-Language Pathology Functional Limitation Discharge Status [phone](G9176) (None) Thank you, Havery MorosDabney Porter, CCC-SLP [phone]  PORTER,DABNEY 11/29/2016, 8:32 PM                   Scheduled Meds: . acidophilus  1 capsule Oral Daily  . ampicillin-sulbactam (UNASYN) IV  3 g Intravenous Q6H  . apixaban  5 mg Oral BID  . furosemide  20 mg Intravenous BID  . guaiFENesin  600 mg Oral BID  . levothyroxine  50 mcg Oral QAC breakfast  . omega-3 acid ethyl esters  1 g Oral Daily  . potassium chloride  10 mEq Oral Daily  . sodium chloride  1 g Oral BID WC   Continuous Infusions: . amiodarone 60 mg/hr (11/30/16 1340)   Followed by  . amiodarone       LOS: 1 day    Time spent: 25 minutes. Greater  than 50% of this time was spent in direct contact with the patient coordinating care.     Chaya Jan, MD [facility] Pager [phone]  If 7PM-7AM, please contact night-coverage [email or web address] Password [facility] 11/30/2016, 4:56 PM

## 2016-12-01 LAB — LEGIONELLA PNEUMOPHILA SEROGP 1 UR AG: L. pneumophila Serogp 1 Ur Ag: NEGATIVE

## 2016-12-01 NOTE — Progress Notes (Signed)
PROGRESS NOTE    Yvonne Chan  ZOX:[id number] DOB: 25-Sep-1929 DOA: 11/28/2016 PCP: Phyllis Ginger     Brief Narrative:  83 year old [facility] after she was noted to have an elevated heart rate. She has also been noticed to have right upper extremity edema and some mild shortness of breath. She was recently hospitalized for influenza and has had a few recent admissions for pneumonia, presumed hospital-acquired. Admission has been requested for further evaluation and management.   Assessment & Plan:   Principal Problem:   A-fib (HCC) Active Problems:   Hypothyroidism   Acute diastolic CHF (congestive heart failure) (HCC)   Hyponatremia   Weakness   Swelling of right upper extremity   Atrial fibrillation with rapid ventricular response -Continue amiodarone drip as heart rate remains uncontrolled in the 120s. -Patient is anticoagulated on Eliquis and this will be continued.  Right upper extremity edema -Doppler without evidence for DVT.  Hospital-acquired pneumonia/aspiration pneumonia -Has had frequent admissions for same. -I suspect there may be a component of aspiration and have requested a speech therapy evaluation. They're recommending a dysphagia 2 diet. -MRSA PCR is negative, will discontinue vancomycin for now.  -Continue Unasyn to cover aspiration pathogens.  Acute on chronic diastolic CHF -Patient appears mildly volume overloaded on exam. Echo in January 2018 shows an ejection fraction of 60-65%. -Suspect pulmonary edema noted on chest x-ray is likely related to rapid rate with her A. Fib. -Continue low dose Lasix of 20 mg IV twice a day due to her soft blood pressures, she is 1.3 L negative since admission.   DVT prophylaxis: Eliquis Code Status: Full Code Family Communication: patient only Disposition Plan: Keep in SDU today due to amiodarone drip  Consultants:   Cardiology  Procedures:    None  Antimicrobials:  Anti-infectives    Start     Dose/Rate Route Frequency Ordered Stop   11/29/16 1800  Ampicillin-Sulbactam (UNASYN) 3 g in sodium chloride 0.9 % 100 mL IVPB     3 g 200 mL/hr over 30 Minutes Intravenous Every 6 hours 11/29/16 1519     11/29/16 1000  vancomycin (VANCOCIN) IVPB 750 mg/150 ml premix  Status:  Discontinued     750 mg 150 mL/hr over 60 Minutes Intravenous Every 12 hours 11/28/16 2130 11/29/16 1510   11/29/16 0000  ceFEPIme (MAXIPIME) 2 g in dextrose 5 % 50 mL IVPB  Status:  Discontinued     2 g 100 mL/hr over 30 Minutes Intravenous Every 8 hours 11/28/16 2130 11/29/16 1510   11/28/16 2200  vancomycin (VANCOCIN) IVPB 1000 mg/200 mL premix     1,000 mg 200 mL/hr over 60 Minutes Intravenous  Once 11/28/16 2130 11/28/16 2310   11/28/16 1830  vancomycin (VANCOCIN) IVPB 1000 mg/200 mL premix     1,000 mg 200 mL/hr over 60 Minutes Intravenous  Once 11/28/16 1821 11/28/16 2112   11/28/16 1830  piperacillin-tazobactam (ZOSYN) IVPB 3.375 g     3.375 g 12.5 mL/hr over 240 Minutes Intravenous  Once 11/28/16 1821 11/28/16 1933       Subjective: Alert to person only, appears mildly confused, able to answer simple questions  Objective: Vitals:   12/01/16 1000 12/01/16 1100 12/01/16 1200 12/01/16 1300  BP: 121/89 93/66 128/74 100/68  Pulse: (!) 122 100 (!) 119 (!) 122  Resp: (!) 31 (!) 24 (!) 26 (!) 30  Temp:      TempSrc:      SpO2: 94% Marland Kitchen)  87%    Weight:      Height:        Intake/Output Summary (Last 24 hours) at 12/01/16 1712 Last data filed at 12/01/16 1220  Gross per 24 hour  Intake          1236.77 ml  Output             1050 ml  Net           186.77 ml   Filed Weights   11/29/16 0500 11/30/16 0500 12/01/16 0400  Weight: 106.8 kg (235 lb 7.2 oz) 106.7 kg (235 lb 3.7 oz) 106.7 kg (235 lb 3.7 oz)    Examination:  General exam:  awake, oriented x 1 Respiratory system: Mild bibasilar crackles Cardiovascular system: irregular rate and  rhythm, no murmurs, rubs or gallops auscultated Gastrointestinal system: Abdomen is nondistended, soft and nontender. No organomegaly or masses felt. Normal bowel sounds heard. Central nervous system: Moves all 4 spontaneously Extremities: Right greater than left upper extremity edema, 1+ bilateral pitting edema Skin: No rashes, lesions or ulcers    Data Reviewed: I have personally reviewed following labs and imaging studies  CBC:  Recent Labs Lab 11/27/16 0730 11/28/16 1617 11/29/16 0426  WBC 10.1 10.8* 11.8*  NEUTROABS 8.3* 9.0*  --   HGB 12.6 12.9 12.3  HCT 38.6 40.5 37.3  MCV 92.6 94.0 92.3  PLT 328 375 345   Basic Metabolic Panel:  Recent Labs Lab 11/24/16 1822 11/27/16 0730 11/28/16 1617 11/29/16 0426 11/30/16 0419  NA 129* 132* 133* 133* 133*  K 3.8 3.7 4.0 3.6 3.6  CL 90* 91* 91* 92* 92*  CO2 32 33* 33* 32 32  GLUCOSE 140* 105* 135* 101* 111*  BUN 20 21* 23* 22* 20  CREATININE 0.71 0.63 0.71 0.67 0.61  CALCIUM 7.7* 7.8* 8.1* 7.9* 7.7*  MG  --   --   --  1.6*  --    GFR: Estimated Creatinine Clearance: 69.2 mL/min (by C-G formula based on SCr of 0.61 mg/dL). Liver Function Tests:  Recent Labs Lab 11/24/16 1822 11/28/16 1617  AST 29 32  ALT 17 21  ALKPHOS 64 55  BILITOT 1.0 0.7  PROT 6.4* 6.7  ALBUMIN 2.2* 2.1*   No results for input(s): LIPASE, AMYLASE in the last 168 hours. No results for input(s): AMMONIA in the last 168 hours. Coagulation Profile: No results for input(s): INR, PROTIME in the last 168 hours. Cardiac Enzymes:  Recent Labs Lab 11/28/16 1617  TROPONINI <0.03   BNP (last 3 results) No results for input(s): PROBNP in the last 8760 hours. HbA1C: No results for input(s): HGBA1C in the last 72 hours. CBG: No results for input(s): GLUCAP in the last 168 hours. Lipid Profile: No results for input(s): CHOL, HDL, LDLCALC, TRIG, CHOLHDL, LDLDIRECT in the last 72 hours. Thyroid Function Tests: No results for input(s): TSH,  T4TOTAL, FREET4, T3FREE, THYROIDAB in the last 72 hours. Anemia Panel: No results for input(s): VITAMINB12, FOLATE, FERRITIN, TIBC, IRON, RETICCTPCT in the last 72 hours. Urine analysis:    Component Value Date/Time   COLORURINE YELLOW 11/29/2016 0400   APPEARANCEUR CLEAR 11/29/2016 0400   LABSPEC 1.010 11/29/2016 0400   PHURINE 5.5 11/29/2016 0400   GLUCOSEU NEGATIVE 11/29/2016 0400   HGBUR TRACE (A) 11/29/2016 0400   BILIRUBINUR NEGATIVE 11/29/2016 0400   KETONESUR NEGATIVE 11/29/2016 0400   PROTEINUR NEGATIVE 11/29/2016 0400   NITRITE NEGATIVE 11/29/2016 0400   LEUKOCYTESUR NEGATIVE 11/29/2016 0400   Sepsis Labs: @  LABRCNTIP(procalcitonin:4,lacticidven:4)  ) Recent Results (from the past 240 hour(s))  Culture, blood (routine x 2)     Status: None (Preliminary result)   Collection Time: 11/28/16  7:57 PM  Result Value Ref Range Status   Specimen Description LEFT ANTECUBITAL  Final   Special Requests BOTTLES DRAWN AEROBIC AND ANAEROBIC 6CC  Final   Culture NO GROWTH 3 DAYS  Final   Report Status PENDING  Incomplete  Culture, blood (routine x 2)     Status: None (Preliminary result)   Collection Time: 11/28/16  8:08 PM  Result Value Ref Range Status   Specimen Description BLOOD LEFT HAND  Final   Special Requests BOTTLES DRAWN AEROBIC ONLY 6CC  Final   Culture NO GROWTH 3 DAYS  Final   Report Status PENDING  Incomplete  MRSA PCR Screening     Status: None   Collection Time: 11/28/16  8:41 PM  Result Value Ref Range Status   MRSA by PCR NEGATIVE NEGATIVE Final    Comment:        The GeneXpert MRSA Assay (FDA approved for NASAL specimens only), is one component of a comprehensive MRSA colonization surveillance program. It is not intended to diagnose MRSA infection nor to guide or monitor treatment for MRSA infections.          Radiology Studies: No results found.      Scheduled Meds: . acidophilus  1 capsule Oral Daily  . ampicillin-sulbactam (UNASYN)  IV  3 g Intravenous Q6H  . apixaban  5 mg Oral BID  . furosemide  20 mg Intravenous BID  . guaiFENesin  600 mg Oral BID  . levothyroxine  50 mcg Oral QAC breakfast  . omega-3 acid ethyl esters  1 g Oral Daily  . potassium chloride  10 mEq Oral Daily  . sodium chloride  1 g Oral BID WC   Continuous Infusions: . amiodarone 30 mg/hr (12/01/16 1100)     LOS: 2 days    Time spent: 25 minutes. Greater than 50% of this time was spent in direct contact with the patient coordinating care.     Chaya JanHERNANDEZ ACOSTA,Krystian Ferrentino, MD [facility] Pager [phone]  If 7PM-7AM, please contact night-coverage [email or web address] Password [email or web address] 12/01/2016, 5:12 PM

## 2016-12-01 NOTE — Plan of Care (Signed)
Problem: Pain Managment: Goal: General experience of comfort will improve Outcome: Completed/Met Date Met: 12/01/16 No c/o pain   Problem: Nutrition: Goal: Adequate nutrition will be maintained Outcome: Progressing Pt needs encouragement to feed herself

## 2016-12-02 LAB — BASIC METABOLIC PANEL
Anion gap: 9 (ref 5–15)
BUN: 16 mg/dL (ref 6–20)
CALCIUM: 7.6 mg/dL — AB (ref 8.9–10.3)
CHLORIDE: 92 mmol/L — AB (ref 101–111)
CO2: 31 mmol/L (ref 22–32)
CREATININE: 0.61 mg/dL (ref 0.44–1.00)
Glucose, Bld: 105 mg/dL — ABNORMAL HIGH (ref 65–99)
Potassium: 3.8 mmol/L (ref 3.5–5.1)
SODIUM: 132 mmol/L — AB (ref 135–145)

## 2016-12-02 MED ORDER — FUROSEMIDE 10 MG/ML IJ SOLN
40.0000 mg | Freq: Two times a day (BID) | INTRAMUSCULAR | Status: DC
  Administered 2016-12-02 – 2016-12-04 (×4): 40 mg via INTRAVENOUS
  Filled 2016-12-02 (×4): qty 4

## 2016-12-02 NOTE — Progress Notes (Signed)
  Speech Language Pathology Treatment: Dysphagia  Patient Details Name: Yvonne Chan MRN: [id number] DOB: 14-Sep-1929 Today's Date: 12/02/2016 Time: [id number] SLP Time Calculation (min) (ACUTE ONLY): 25 min  Assessment / Plan / Recommendation Clinical Impression  Pt seen while upright in bed with AM meal. SLP reviewed results of MBSS completed on Thursday with reminders to swallow 2x for each sip of liquid. MBSS showed primarily oral phase dysphagia (residuals spill down into valleculae) and esophageal phase (delayed emptying and risk for reflux). Pt appears visibly tired, but willing to consume oatmeal, applesauce, and liquids. Pt with limited intake of foods, however she readily drank liquids. Consider offering Ensure if recommended by RD for energy conservation and fatigue. Continue diet as ordered.   HPI HPI: Yvonne SneddonMary Walkeris a 83 y.o.femalewith medical history significant ofHTN, PAF on Eliquis, diastolic CHF, and hypothyroidism; [facility] a fastirregular heartbeat noticed this afternoon by staff. According to family present at bedside she has had increasing swelling in her right arms and bilateral legs. Patient complains of feeling short of breath. Review of records shows that the patient has beenhospitalized multiple times within the last month1/25-1/30, 1/31-2/9, and then 2/9-2/10. Treated for atrial fibrillation, diastolic CHF, influenza, and community-acquired pneumonia. Other associated symptoms include decreased appetite and generalized weakness. Patient denies any chest pain, dysuria, nausea, vomiting, orabdominal pain. Review of records shows the patient weight back on 2/6was 224 pounds. Upon admission into the emergency department patient was seen to be afebrile, pulse 120-140s, respirations 20-34, blood pressure 96/71-131/80, and O2 saturations maintained at room air. Lab work revealed WBC 10.8, sodium 133, chloride 91, CO2 33, BUN 23, creatinine 0.71, calcium  8.1, glucose 135, troponin 0.03, andBNP 240. Due to the patient's elevated heart rates she was placed on a diltiazem drip. TRH called to admit. Recommended empiric antibiotics of vancomycin and Zosyn for possibility of a healthcare associated pneumonia. SLP ordered for BSE due to concerns about aspiration risk.      SLP Plan  Continue with current plan of care       Recommendations  Diet recommendations: Dysphagia 2 (fine chop);Thin liquid Liquids provided via: Cup;Straw Medication Administration: Whole meds with liquid Supervision: Staff to assist with self feeding;Full supervision/cueing for compensatory strategies Compensations: Slow rate;Multiple dry swallows after each bite/sip Postural Changes and/or Swallow Maneuvers: Seated upright 90 degrees;Upright 30-60 min after meal                Oral Care Recommendations: Oral care BID;Staff/trained caregiver to provide oral care Follow up Recommendations: Skilled Nursing facility SLP Visit Diagnosis: Dysphagia, oropharyngeal phase (R13.12);Dysphagia, pharyngeal phase (R13.13) Plan: Continue with current plan of care       Thank you,  Havery MorosDabney Porter, CCC-SLP [phone]                 PORTER,DABNEY 12/02/2016, 10:33 AM

## 2016-12-02 NOTE — Progress Notes (Signed)
Pharmacy Antibiotic Note  Yvonne Chan is a 83 y.o. female admitted on 11/28/2016 with aspiration pneumonia.  Pharmacy has been consulted for unasyn dosing.  Plan: Unasyn 3gm IV q6h F/U cxs and clinical progress Monitor V/S, labs  Height: 5\' 11"  (180.3 cm) Weight: 238 lb 5.1 oz (108.1 kg) IBW/kg (Calculated) : 70.8  Temp (24hrs), Avg:99.3 F (37.4 C), Min:99.2 F (37.3 C), Max:99.4 F (37.4 C)   Recent Labs Lab 11/27/16 0730 11/28/16 1617 11/28/16 1646 11/29/16 0426 11/29/16 1045 11/30/16 0419 12/02/16 0446  WBC 10.1 10.8*  --  11.8*  --   --   --   CREATININE 0.63 0.71  --  0.67  --  0.61 0.61  LATICACIDVEN  --   --  2.1*  --  1.4  --   --     Normalized CrCl 4465mls/min  Estimated Creatinine Clearance: 69.6 mL/min (by C-G formula based on SCr of 0.61 mg/dL).    No Known Allergies  Antimicrobials this admission: Unasyn 2/22>>  Vancomycin 2/21 >> 2/22  Cefepime 2/21>>2/22 Zosyn 2/21 x 1 in ED  Dose adjustments this admission: N/A  Microbiology results: 2/21 BCx: ngtd 2/1 MRSA PCR: negative  Thank you for allowing pharmacy to be a part of this patient's care.  Valrie HartScott Tomeshia Pizzi, PharmD Clinical Pharmacist Pager:  [phone] 12/02/2016   12/02/2016 9:48 AM

## 2016-12-02 NOTE — Progress Notes (Signed)
PROGRESS NOTE    Yvonne Chan  KGM:[id number] DOB: 1928/10/29 DOA: 11/28/2016 PCP: Phyllis Ginger     Brief Narrative:  83 year old [facility] after she was noted to have an elevated heart rate. She has also been noticed to have right upper extremity edema and some mild shortness of breath. She was recently hospitalized for influenza and has had a few recent admissions for pneumonia, presumed hospital-acquired. Admission has been requested for further evaluation and management.   Assessment & Plan:   Principal Problem:   A-fib (HCC) Active Problems:   Hypothyroidism   Acute diastolic CHF (congestive heart failure) (HCC)   Hyponatremia   Weakness   Swelling of right upper extremity   Atrial fibrillation with rapid ventricular response -Continue amiodarone drip as heart rate remains uncontrolled in the 110-120s. -Patient is anticoagulated on Eliquis and this will be continued.  Right upper extremity edema -Doppler without evidence for DVT.  Hospital-acquired pneumonia/aspiration pneumonia -Has had frequent admissions for same. -I suspect there may be a component of aspiration and have requested a speech therapy evaluation. They're recommending a dysphagia 2 diet. -MRSA PCR is negative, will discontinue vancomycin for now.  -Continue Unasyn to cover aspiration pathogens.  Acute on chronic diastolic CHF -Patient appears volume overloaded on exam. Echo in January 2018 shows an ejection fraction of 60-65%. -Suspect pulmonary edema noted on chest x-ray is likely related to rapid rate with her A. Fib. -As blood pressures improved and she still appears hypervolemic on exam we'll increase Lasix to 40 mg IV twice a day. she is 1.3 L negative since admission.   DVT prophylaxis: Eliquis Code Status: Full Code Family Communication: patient only Disposition Plan: Keep in SDU today due to amiodarone drip  Consultants:    Cardiology  Procedures:   None  Antimicrobials:  Anti-infectives    Start     Dose/Rate Route Frequency Ordered Stop   11/29/16 1800  Ampicillin-Sulbactam (UNASYN) 3 g in sodium chloride 0.9 % 100 mL IVPB     3 g 200 mL/hr over 30 Minutes Intravenous Every 6 hours 11/29/16 1519     11/29/16 1000  vancomycin (VANCOCIN) IVPB 750 mg/150 ml premix  Status:  Discontinued     750 mg 150 mL/hr over 60 Minutes Intravenous Every 12 hours 11/28/16 2130 11/29/16 1510   11/29/16 0000  ceFEPIme (MAXIPIME) 2 g in dextrose 5 % 50 mL IVPB  Status:  Discontinued     2 g 100 mL/hr over 30 Minutes Intravenous Every 8 hours 11/28/16 2130 11/29/16 1510   11/28/16 2200  vancomycin (VANCOCIN) IVPB 1000 mg/200 mL premix     1,000 mg 200 mL/hr over 60 Minutes Intravenous  Once 11/28/16 2130 11/28/16 2310   11/28/16 1830  vancomycin (VANCOCIN) IVPB 1000 mg/200 mL premix     1,000 mg 200 mL/hr over 60 Minutes Intravenous  Once 11/28/16 1821 11/28/16 2112   11/28/16 1830  piperacillin-tazobactam (ZOSYN) IVPB 3.375 g     3.375 g 12.5 mL/hr over 240 Minutes Intravenous  Once 11/28/16 1821 11/28/16 1933       Subjective: Alert to person only, appears mildly confused, able to answer simple questions  Objective: Vitals:   12/02/16 1430 12/02/16 1500 12/02/16 1530 12/02/16 1600  BP:  102/70 94/75 108/70  Pulse:  (!) 112 (!) 123 (!) 103  Resp:   (!) 24 (!) 28  Temp:    98.4 F (36.9 C)  TempSrc:  Oral  SpO2: 98% 97% 96% 95%  Weight:      Height:        Intake/Output Summary (Last 24 hours) at 12/02/16 1722 Last data filed at 12/02/16 1600  Gross per 24 hour  Intake           1127.4 ml  Output              600 ml  Net            527.4 ml   Filed Weights   11/30/16 0500 12/01/16 0400 12/02/16 0500  Weight: 106.7 kg (235 lb 3.7 oz) 106.7 kg (235 lb 3.7 oz) 108.1 kg (238 lb 5.1 oz)    Examination:  General exam:  awake, oriented x 1 Respiratory system: Mild bibasilar  crackles Cardiovascular system: irregular rate and rhythm, no murmurs, rubs or gallops auscultated Gastrointestinal system: Abdomen is nondistended, soft and nontender. No organomegaly or masses felt. Normal bowel sounds heard. Central nervous system: Moves all 4 spontaneously Extremities: Right greater than left upper extremity edema, 1+ bilateral pitting edema Skin: No rashes, lesions or ulcers    Data Reviewed: I have personally reviewed following labs and imaging studies  CBC:  Recent Labs Lab 11/27/16 0730 11/28/16 1617 11/29/16 0426  WBC 10.1 10.8* 11.8*  NEUTROABS 8.3* 9.0*  --   HGB 12.6 12.9 12.3  HCT 38.6 40.5 37.3  MCV 92.6 94.0 92.3  PLT 328 375 345   Basic Metabolic Panel:  Recent Labs Lab 11/27/16 0730 11/28/16 1617 11/29/16 0426 11/30/16 0419 12/02/16 0446  NA 132* 133* 133* 133* 132*  K 3.7 4.0 3.6 3.6 3.8  CL 91* 91* 92* 92* 92*  CO2 33* 33* 32 32 31  GLUCOSE 105* 135* 101* 111* 105*  BUN 21* 23* 22* 20 16  CREATININE 0.63 0.71 0.67 0.61 0.61  CALCIUM 7.8* 8.1* 7.9* 7.7* 7.6*  MG  --   --  1.6*  --   --    GFR: Estimated Creatinine Clearance: 69.6 mL/min (by C-G formula based on SCr of 0.61 mg/dL). Liver Function Tests:  Recent Labs Lab 11/28/16 1617  AST 32  ALT 21  ALKPHOS 55  BILITOT 0.7  PROT 6.7  ALBUMIN 2.1*   No results for input(s): LIPASE, AMYLASE in the last 168 hours. No results for input(s): AMMONIA in the last 168 hours. Coagulation Profile: No results for input(s): INR, PROTIME in the last 168 hours. Cardiac Enzymes:  Recent Labs Lab 11/28/16 1617  TROPONINI <0.03   BNP (last 3 results) No results for input(s): PROBNP in the last 8760 hours. HbA1C: No results for input(s): HGBA1C in the last 72 hours. CBG: No results for input(s): GLUCAP in the last 168 hours. Lipid Profile: No results for input(s): CHOL, HDL, LDLCALC, TRIG, CHOLHDL, LDLDIRECT in the last 72 hours. Thyroid Function Tests: No results for  input(s): TSH, T4TOTAL, FREET4, T3FREE, THYROIDAB in the last 72 hours. Anemia Panel: No results for input(s): VITAMINB12, FOLATE, FERRITIN, TIBC, IRON, RETICCTPCT in the last 72 hours. Urine analysis:    Component Value Date/Time   COLORURINE YELLOW 11/29/2016 0400   APPEARANCEUR CLEAR 11/29/2016 0400   LABSPEC 1.010 11/29/2016 0400   PHURINE 5.5 11/29/2016 0400   GLUCOSEU NEGATIVE 11/29/2016 0400   HGBUR TRACE (A) 11/29/2016 0400   BILIRUBINUR NEGATIVE 11/29/2016 0400   KETONESUR NEGATIVE 11/29/2016 0400   PROTEINUR NEGATIVE 11/29/2016 0400   NITRITE NEGATIVE 11/29/2016 0400   LEUKOCYTESUR NEGATIVE 11/29/2016 0400   Sepsis Labs: @LABRCNTIP (procalcitonin:4,lacticidven:4)  )  Recent Results (from the past 240 hour(s))  Culture, blood (routine x 2)     Status: None (Preliminary result)   Collection Time: 11/28/16  7:57 PM  Result Value Ref Range Status   Specimen Description LEFT ANTECUBITAL  Final   Special Requests BOTTLES DRAWN AEROBIC AND ANAEROBIC 6CC  Final   Culture NO GROWTH 3 DAYS  Final   Report Status PENDING  Incomplete  Culture, blood (routine x 2)     Status: None (Preliminary result)   Collection Time: 11/28/16  8:08 PM  Result Value Ref Range Status   Specimen Description BLOOD LEFT HAND  Final   Special Requests BOTTLES DRAWN AEROBIC ONLY 6CC  Final   Culture NO GROWTH 3 DAYS  Final   Report Status PENDING  Incomplete  MRSA PCR Screening     Status: None   Collection Time: 11/28/16  8:41 PM  Result Value Ref Range Status   MRSA by PCR NEGATIVE NEGATIVE Final    Comment:        The GeneXpert MRSA Assay (FDA approved for NASAL specimens only), is one component of a comprehensive MRSA colonization surveillance program. It is not intended to diagnose MRSA infection nor to guide or monitor treatment for MRSA infections.          Radiology Studies: No results found.      Scheduled Meds: . acidophilus  1 capsule Oral Daily  .  ampicillin-sulbactam (UNASYN) IV  3 g Intravenous Q6H  . apixaban  5 mg Oral BID  . furosemide  20 mg Intravenous BID  . guaiFENesin  600 mg Oral BID  . levothyroxine  50 mcg Oral QAC breakfast  . omega-3 acid ethyl esters  1 g Oral Daily  . potassium chloride  10 mEq Oral Daily  . sodium chloride  1 g Oral BID WC   Continuous Infusions: . amiodarone 30 mg/hr (12/02/16 1600)     LOS: 3 days    Time spent: 25 minutes. Greater than 50% of this time was spent in direct contact with the patient coordinating care.     Chaya Jan, MD [facility] Pager [phone]  If 7PM-7AM, please contact night-coverage [email or web address] Password [email or web address] 12/02/2016, 5:22 PM

## 2016-12-03 ENCOUNTER — Encounter (HOSPITAL_COMMUNITY)
Admission: RE | Admit: 2016-12-03 | Discharge: 2016-12-03 | Disposition: A | Discharge status: Home / Self Care | Payer: Medicare HMO | Source: Skilled Nursing Facility | Attending: Internal Medicine | Admitting: Internal Medicine

## 2016-12-03 DIAGNOSIS — M792 Neuralgia and neuritis, unspecified: Secondary | ICD-10-CM | POA: Insufficient documentation

## 2016-12-03 DIAGNOSIS — I4891 Unspecified atrial fibrillation: Secondary | ICD-10-CM | POA: Insufficient documentation

## 2016-12-03 DIAGNOSIS — I10 Essential (primary) hypertension: Secondary | ICD-10-CM | POA: Insufficient documentation

## 2016-12-03 DIAGNOSIS — E039 Hypothyroidism, unspecified: Secondary | ICD-10-CM | POA: Insufficient documentation

## 2016-12-03 DIAGNOSIS — J111 Influenza due to unidentified influenza virus with other respiratory manifestations: Secondary | ICD-10-CM | POA: Insufficient documentation

## 2016-12-03 DIAGNOSIS — E871 Hypo-osmolality and hyponatremia: Secondary | ICD-10-CM | POA: Insufficient documentation

## 2016-12-03 LAB — CULTURE, BLOOD (ROUTINE X 2)
CULTURE: NO GROWTH
Culture: NO GROWTH

## 2016-12-03 MED ORDER — AMIODARONE HCL 200 MG PO TABS
400.0000 mg | ORAL_TABLET | Freq: Two times a day (BID) | ORAL | Status: DC
  Administered 2016-12-03 – 2016-12-05 (×6): 400 mg via ORAL
  Filled 2016-12-03 (×6): qty 2

## 2016-12-03 MED ORDER — METOPROLOL TARTRATE 25 MG PO TABS
25.0000 mg | ORAL_TABLET | Freq: Two times a day (BID) | ORAL | Status: DC
  Administered 2016-12-03 (×2): 25 mg via ORAL
  Filled 2016-12-03 (×2): qty 1

## 2016-12-03 MED ORDER — AMIODARONE HCL 200 MG PO TABS: 200.0000 mg | ORAL_TABLET | Freq: Two times a day (BID) | ORAL | Status: DC

## 2016-12-03 MED ORDER — METOPROLOL TARTRATE 25 MG PO TABS: 12.5000 mg | ORAL_TABLET | Freq: Two times a day (BID) | ORAL | Status: DC

## 2016-12-03 NOTE — Progress Notes (Signed)
Progress Note  Patient Name: Romero BellingMary Nakatani Date of Encounter: 12/03/2016  Primary Cardiologist: Prentice DockerKoneswaran, Suresh MD  Subjective   Denies chest pain. Breathing better. No palpitations.  Inpatient Medications    Scheduled Meds: . acidophilus  1 capsule Oral Daily  . ampicillin-sulbactam (UNASYN) IV  3 g Intravenous Q6H  . apixaban  5 mg Oral BID  . furosemide  40 mg Intravenous BID  . guaiFENesin  600 mg Oral BID  . levothyroxine  50 mcg Oral QAC breakfast  . omega-3 acid ethyl esters  1 g Oral Daily  . potassium chloride  10 mEq Oral Daily  . sodium chloride  1 g Oral BID WC   Continuous Infusions: . amiodarone 30 mg/hr (12/03/16 0415)   PRN Meds: acetaminophen, ipratropium-albuterol, ondansetron (ZOFRAN) IV   Vital Signs    Vitals:   12/03/16 0300 12/03/16 0400 12/03/16 0500 12/03/16 0750  BP: 114/71 101/76 118/84   Pulse: (!) 119 (!) 118 (!) 127 (!) 110  Resp: 20 (!) 27 (!) 24 (!) 26  Temp:  98.8 F (37.1 C)  98.4 F (36.9 C)  TempSrc:  Oral  Axillary  SpO2: 99% 98% 99% 99%  Weight:   239 lb 6.7 oz (108.6 kg)   Height:        Intake/Output Summary (Last 24 hours) at 12/03/16 0828 Last data filed at 12/03/16 0500  Gross per 24 hour  Intake            617.3 ml  Output              900 ml  Net           -282.7 ml   Filed Weights   12/01/16 0400 12/02/16 0500 12/03/16 0500  Weight: 235 lb 3.7 oz (106.7 kg) 238 lb 5.1 oz (108.1 kg) 239 lb 6.7 oz (108.6 kg)    Telemetry    I personally reviewed telemetry which shows atrial fibrillation with heart rate 100-120 range.  ECG    I personally reviewed the tracing from 11/28/2016 which showed atrial fibrillation with low voltage and nonspecific T-wave changes.  Physical Exam   GEN: No acute distress.   Neck: No JVD Cardiac: IRRR, tachycardic,  no murmurs, rubs, or gallops.  Respiratory: Diminished in the bases. No wheezes GI: Soft, nontender, non-distended  MS: No edema; No deformity.Bilateral SCD    Neuro:  Nonfocal   Labs    Chemistry Recent Labs Lab 11/28/16 1617 11/29/16 0426 11/30/16 0419 12/02/16 0446  NA 133* 133* 133* 132*  K 4.0 3.6 3.6 3.8  CL 91* 92* 92* 92*  CO2 33* 32 32 31  GLUCOSE 135* 101* 111* 105*  BUN 23* 22* 20 16  CREATININE 0.71 0.67 0.61 0.61  CALCIUM 8.1* 7.9* 7.7* 7.6*  PROT 6.7  --   --   --   ALBUMIN 2.1*  --   --   --   AST 32  --   --   --   ALT 21  --   --   --   ALKPHOS 55  --   --   --   BILITOT 0.7  --   --   --   GFRNONAA >60 >60 >60 >60  GFRAA >60 >60 >60 >60  ANIONGAP 9 9 9 9      Hematology Recent Labs Lab 11/27/16 0730 11/28/16 1617 11/29/16 0426  WBC 10.1 10.8* 11.8*  RBC 4.17 4.31 4.04  HGB 12.6 12.9 12.3  HCT 38.6 40.5  37.3  MCV 92.6 94.0 92.3  MCH 30.2 29.9 30.4  MCHC 32.6 31.9 33.0  RDW 13.9 13.9 13.9  PLT 328 375 345    Cardiac Enzymes Recent Labs Lab 11/28/16 1617  TROPONINI <0.03   No results for input(s): TROPIPOC in the last 168 hours.   BNP Recent Labs Lab 11/27/16 0730 11/28/16 1617  BNP 258.0* 240.0*     Radiology    No results found.  Cardiac Studies   Echocardiogram 11/02/2016 Left ventricle: The cavity size was normal. Wall thickness was increased in a pattern of moderate LVH. Systolic function was normal. The estimated ejection fraction was in the range of 60% to 65%. - Aortic valve: Mildly calcified annulus. Mildly thickened leaflets. Valve area (VTI): 2.22 cm^2. Valve area (Vmax): 2.15 cm^2. - Mitral valve: Mildly calcified annulus. Mildly thickened leaflets . There was mild regurgitation. - Left atrium: The atrium was moderately dilated. - Right ventricle: The cavity size was mildly dilated. - Right atrium: The atrium was moderately dilated. - Technically difficult study.   Patient Profile     83 y.o. female with reported history of HTN, atrial fibrillation on Eliquis, chronic diastolic CHF, and hypothyroidism who is a resident of the [facility], who  presented to ER with complaints of edema, dyspnea and racing heart rate. She has been recently hospitalized in January of 2018,for influenza and CAP, and again this month for influenza. Found to have atrial fibrillation with RVR. Placed on amiodarone gtt on Friday.   Assessment & Plan    1. Atrial fib with RVR: Remains on amiodarone gtt at 30 mg/hr over the weekend after initial bolus and loading, along with Eliquis. Do not plan cardioversion at this point. Will focus on returning to an oral rate control regimen. Will add metoprolol 45m mg BID, will transition to po amiodarone 400 mg BID.  2. Hypertension: BP is soft. Stopping amiodarone and starting po. Add low dose metoprolol.   3. Diastolic CHF: No evidence of decompensation at this time.   4. HCAP: Per PCP  Signed, Joni Reining, NP  12/03/2016, 8:28 AM     Attending note:  Patient seen and examined. Reviewed records and hospital course. Patient has persistent atrial fibrillation with elevated heart rate, recent pneumonia and influenza likely contributing to some degree. She was placed on IV amiodarone through the weekend without much positive effect on heart rate control. She has been on Eliquis. Plan at this time is to initiate metoprolol and up titrate as blood pressure will allow. Also convert to oral amiodarone which we will continue at loading dose at least for the time being. Cardioversion is not planned at this time.  Jonelle Sidle, M.D., F.A.C.C.

## 2016-12-03 NOTE — Progress Notes (Signed)
PROGRESS NOTE    Yvonne Chan  ZOX:[id number] DOB: Jun 03, 1929 DOA: 11/28/2016 PCP: Phyllis Ginger     Brief Narrative:  83 [facility] old [facility] after she was noted to have an elevated heart rate. She has also been noticed to have right upper extremity edema and some mild shortness of breath. She was recently hospitalized for influenza and has had a few recent admissions for pneumonia, presumed hospital-acquired. Admission has been requested for further evaluation and management.   Assessment & Plan:   Principal Problem:   A-fib (HCC) Active Problems:   Hypothyroidism   Acute diastolic CHF (congestive heart failure) (HCC)   Hyponatremia   Weakness   Swelling of right upper extremity   Atrial fibrillation with rapid ventricular response -Heart rate remains uncontrolled. -As per cardiology recommendations: Transition amiodarone to by mouth, add low-dose metoprolol and uptitrate as   blood pressure allows. -Patient is anticoagulated on Eliquis and this will be continued.  Right upper extremity edema -Doppler without evidence for DVT.  Hospital-acquired pneumonia/aspiration pneumonia -Has had frequent admissions for same. -I suspect there may be a component of aspiration and have requested a speech therapy evaluation. They're recommending a dysphagia 2 diet. -MRSA PCR is negative, will discontinue vancomycin for now.  -Continue Unasyn to cover aspiration pathogens.  Acute on chronic diastolic CHF -Patient appears volume overloaded on exam. Echo in January 2018 shows an ejection fraction of 60-65%. -Suspect pulmonary edema noted on chest x-ray is likely related to rapid rate with her A. Fib. -As blood pressures have improved and she still appears hypervolemic on exam we'll increase Lasix to 40 mg IV twice a day. she is 1.3 L negative since admission.   DVT prophylaxis: Eliquis Code Status: Full Code Family Communication: patient  only Disposition Plan: Keep in SDU today, consider transfer to floor in 24 hours depending on HR control  Consultants:   Cardiology  Procedures:   None  Antimicrobials:  Anti-infectives    Start     Dose/Rate Route Frequency Ordered Stop   11/29/16 1800  Ampicillin-Sulbactam (UNASYN) 3 g in sodium chloride 0.9 % 100 mL IVPB     3 g 200 mL/hr over 30 Minutes Intravenous Every 6 hours 11/29/16 1519     11/29/16 1000  vancomycin (VANCOCIN) IVPB 750 mg/150 ml premix  Status:  Discontinued     750 mg 150 mL/hr over 60 Minutes Intravenous Every 12 hours 11/28/16 2130 11/29/16 1510   11/29/16 0000  ceFEPIme (MAXIPIME) 2 g in dextrose 5 % 50 mL IVPB  Status:  Discontinued     2 g 100 mL/hr over 30 Minutes Intravenous Every 8 hours 11/28/16 2130 11/29/16 1510   11/28/16 2200  vancomycin (VANCOCIN) IVPB 1000 mg/200 mL premix     1,000 mg 200 mL/hr over 60 Minutes Intravenous  Once 11/28/16 2130 11/28/16 2310   11/28/16 1830  vancomycin (VANCOCIN) IVPB 1000 mg/200 mL premix     1,000 mg 200 mL/hr over 60 Minutes Intravenous  Once 11/28/16 1821 11/28/16 2112   11/28/16 1830  piperacillin-tazobactam (ZOSYN) IVPB 3.375 g     3.375 g 12.5 mL/hr over 240 Minutes Intravenous  Once 11/28/16 1821 11/28/16 1933       Subjective: Alert to person only, appears mildly confused, able to answer simple questions  Objective: Vitals:   12/03/16 0800 12/03/16 1122 12/03/16 1214 12/03/16 1625  BP: 102/73  117/75   Pulse: (!) 107 (!) 127 (!) 119 99  Resp: (!) 25 20  19   Temp:  97.7 F (36.5 C)  97.7 F (36.5 C)  TempSrc:  Oral  Oral  SpO2: 100% 98%  99%  Weight:      Height:        Intake/Output Summary (Last 24 hours) at 12/03/16 1728 Last data filed at 12/03/16 1704  Gross per 24 hour  Intake          1503.34 ml  Output             1800 ml  Net          -296.66 ml   Filed Weights   12/01/16 0400 12/02/16 0500 12/03/16 0500  Weight: 106.7 kg (235 lb 3.7 oz) 108.1 kg (238 lb 5.1 oz)  108.6 kg (239 lb 6.7 oz)    Examination:  General exam:  awake, oriented x 1 Respiratory system: Mild bibasilar crackles Cardiovascular system: irregular rate and rhythm, no murmurs, rubs or gallops auscultated Gastrointestinal system: Abdomen is nondistended, soft and nontender. No organomegaly or masses felt. Normal bowel sounds heard. Central nervous system: Moves all 4 spontaneously Extremities: Right greater than left upper extremity edema, 1+ bilateral pitting edema Skin: No rashes, lesions or ulcers    Data Reviewed: I have personally reviewed following labs and imaging studies  CBC:  Recent Labs Lab 11/27/16 0730 11/28/16 1617 11/29/16 0426  WBC 10.1 10.8* 11.8*  NEUTROABS 8.3* 9.0*  --   HGB 12.6 12.9 12.3  HCT 38.6 40.5 37.3  MCV 92.6 94.0 92.3  PLT 328 375 345   Basic Metabolic Panel:  Recent Labs Lab 11/27/16 0730 11/28/16 1617 11/29/16 0426 11/30/16 0419 12/02/16 0446  NA 132* 133* 133* 133* 132*  K 3.7 4.0 3.6 3.6 3.8  CL 91* 91* 92* 92* 92*  CO2 33* 33* 32 32 31  GLUCOSE 105* 135* 101* 111* 105*  BUN 21* 23* 22* 20 16  CREATININE 0.63 0.71 0.67 0.61 0.61  CALCIUM 7.8* 8.1* 7.9* 7.7* 7.6*  MG  --   --  1.6*  --   --    GFR: Estimated Creatinine Clearance: 69.7 mL/min (by C-G formula based on SCr of 0.61 mg/dL). Liver Function Tests:  Recent Labs Lab 11/28/16 1617  AST 32  ALT 21  ALKPHOS 55  BILITOT 0.7  PROT 6.7  ALBUMIN 2.1*   No results for input(s): LIPASE, AMYLASE in the last 168 hours. No results for input(s): AMMONIA in the last 168 hours. Coagulation Profile: No results for input(s): INR, PROTIME in the last 168 hours. Cardiac Enzymes:  Recent Labs Lab 11/28/16 1617  TROPONINI <0.03   BNP (last 3 results) No results for input(s): PROBNP in the last 8760 hours. HbA1C: No results for input(s): HGBA1C in the last 72 hours. CBG: No results for input(s): GLUCAP in the last 168 hours. Lipid Profile: No results for  input(s): CHOL, HDL, LDLCALC, TRIG, CHOLHDL, LDLDIRECT in the last 72 hours. Thyroid Function Tests: No results for input(s): TSH, T4TOTAL, FREET4, T3FREE, THYROIDAB in the last 72 hours. Anemia Panel: No results for input(s): VITAMINB12, FOLATE, FERRITIN, TIBC, IRON, RETICCTPCT in the last 72 hours. Urine analysis:    Component Value Date/Time   COLORURINE YELLOW 11/29/2016 0400   APPEARANCEUR CLEAR 11/29/2016 0400   LABSPEC 1.010 11/29/2016 0400   PHURINE 5.5 11/29/2016 0400   GLUCOSEU NEGATIVE 11/29/2016 0400   HGBUR TRACE (A) 11/29/2016 0400   BILIRUBINUR NEGATIVE 11/29/2016 0400   KETONESUR NEGATIVE 11/29/2016 0400   PROTEINUR NEGATIVE  11/29/2016 0400   NITRITE NEGATIVE 11/29/2016 0400   LEUKOCYTESUR NEGATIVE 11/29/2016 0400   Sepsis Labs: @LABRCNTIP (procalcitonin:4,lacticidven:4)  ) Recent Results (from the past 240 hour(s))  Culture, blood (routine x 2)     Status: None   Collection Time: 11/28/16  7:57 PM  Result Value Ref Range Status   Specimen Description LEFT ANTECUBITAL  Final   Special Requests BOTTLES DRAWN AEROBIC AND ANAEROBIC 6CC  Final   Culture NO GROWTH 5 DAYS  Final   Report Status 12/03/2016 FINAL  Final  Culture, blood (routine x 2)     Status: None   Collection Time: 11/28/16  8:08 PM  Result Value Ref Range Status   Specimen Description BLOOD LEFT HAND  Final   Special Requests BOTTLES DRAWN AEROBIC ONLY 6CC  Final   Culture NO GROWTH 5 DAYS  Final   Report Status 12/03/2016 FINAL  Final  MRSA PCR Screening     Status: None   Collection Time: 11/28/16  8:41 PM  Result Value Ref Range Status   MRSA by PCR NEGATIVE NEGATIVE Final    Comment:        The GeneXpert MRSA Assay (FDA approved for NASAL specimens only), is one component of a comprehensive MRSA colonization surveillance program. It is not intended to diagnose MRSA infection nor to guide or monitor treatment for MRSA infections.          Radiology Studies: No results  found.      Scheduled Meds: . acidophilus  1 capsule Oral Daily  . amiodarone  400 mg Oral BID  . ampicillin-sulbactam (UNASYN) IV  3 g Intravenous Q6H  . apixaban  5 mg Oral BID  . furosemide  40 mg Intravenous BID  . guaiFENesin  600 mg Oral BID  . levothyroxine  50 mcg Oral QAC breakfast  . metoprolol tartrate  25 mg Oral BID  . omega-3 acid ethyl esters  1 g Oral Daily  . potassium chloride  10 mEq Oral Daily  . sodium chloride  1 g Oral BID WC   Continuous Infusions:    LOS: 4 days    Time spent: 25 minutes. Greater than 50% of this time was spent in direct contact with the patient coordinating care.     Chaya Jan, MD [facility] Pager [phone]  If 7PM-7AM, please contact night-coverage [email or web address] Password [facility] 12/03/2016, 5:28 PM

## 2016-12-04 DIAGNOSIS — I5032 Chronic diastolic (congestive) heart failure: Secondary | ICD-10-CM

## 2016-12-04 MED ORDER — METOPROLOL TARTRATE 25 MG PO TABS
25.0000 mg | ORAL_TABLET | Freq: Three times a day (TID) | ORAL | Status: DC
  Administered 2016-12-04 – 2016-12-05 (×3): 25 mg via ORAL
  Filled 2016-12-04 (×4): qty 1

## 2016-12-04 MED ORDER — FUROSEMIDE 40 MG PO TABS
40.0000 mg | ORAL_TABLET | Freq: Every day | ORAL | Status: DC
  Administered 2016-12-04 – 2016-12-06 (×3): 40 mg via ORAL
  Filled 2016-12-04 (×2): qty 1

## 2016-12-04 NOTE — Progress Notes (Signed)
Report given Yvonne Matenne Rn. Pt transported via bed to 331

## 2016-12-04 NOTE — Progress Notes (Signed)
PROGRESS NOTE    Yvonne Chan  KGM:[id number] DOB: 08-11-1929 DOA: 11/28/2016 PCP: Phyllis GingerBAUCOM, JENNY B, PA-C     Brief Narrative:  83 year old woman admitted [facility] after she was noted to have an elevated heart rate. She has also been noticed to have right upper extremity edema and some mild shortness of breath. She was recently hospitalized for influenza and has had a few recent admissions for pneumonia, presumed hospital-acquired. Admission has been requested for further evaluation and management.   Assessment & Plan:   Principal Problem:   A-fib (HCC) Active Problems:   Hypothyroidism   Acute diastolic CHF (congestive heart failure) (HCC)   Hyponatremia   Weakness   Swelling of right upper extremity   Atrial fibrillation with rapid ventricular response -Heart rate remains uncontrolled. BP remains low-normal. -Continue amiodarone PO. -Cardiology is increasing lopressor dose and decreasing lasix to allow for BB titration. -Patient is anticoagulated on Eliquis and this will be continued.  Right upper extremity edema -Doppler without evidence for DVT.  Hospital-acquired pneumonia/aspiration pneumonia -Has had frequent admissions for same. -I suspect there may be a component of aspiration and have requested a speech therapy evaluation. They're recommending a dysphagia 2 diet. -MRSA PCR is negative, will discontinue vancomycin for now.  -Continue Unasyn to cover aspiration pathogens. -Can transition over to PO over next 24-48 hours for an extra 2-3 days.  Acute on chronic diastolic CHF -Patient appears volume overloaded on exam. Echo in January 2018 shows an ejection fraction of 60-65%. -Suspect pulmonary edema noted on chest x-ray is likely related to rapid rate with her A. Fib. - she is 1.9 L negative since admission. Lasix dose has been decreased today.   DVT prophylaxis: Eliquis Code Status: Full Code Family Communication: patient  only Disposition Plan:Transfer to floor. Anticipate DC SNF in 24-48 hours  Consultants:   Cardiology  Procedures:   None  Antimicrobials:  Anti-infectives    Start     Dose/Rate Route Frequency Ordered Stop   11/29/16 1800  Ampicillin-Sulbactam (UNASYN) 3 g in sodium chloride 0.9 % 100 mL IVPB     3 g 200 mL/hr over 30 Minutes Intravenous Every 6 hours 11/29/16 1519     11/29/16 1000  vancomycin (VANCOCIN) IVPB 750 mg/150 ml premix  Status:  Discontinued     750 mg 150 mL/hr over 60 Minutes Intravenous Every 12 hours 11/28/16 2130 11/29/16 1510   11/29/16 0000  ceFEPIme (MAXIPIME) 2 g in dextrose 5 % 50 mL IVPB  Status:  Discontinued     2 g 100 mL/hr over 30 Minutes Intravenous Every 8 hours 11/28/16 2130 11/29/16 1510   11/28/16 2200  vancomycin (VANCOCIN) IVPB 1000 mg/200 mL premix     1,000 mg 200 mL/hr over 60 Minutes Intravenous  Once 11/28/16 2130 11/28/16 2310   11/28/16 1830  vancomycin (VANCOCIN) IVPB 1000 mg/200 mL premix     1,000 mg 200 mL/hr over 60 Minutes Intravenous  Once 11/28/16 1821 11/28/16 2112   11/28/16 1830  piperacillin-tazobactam (ZOSYN) IVPB 3.375 g     3.375 g 12.5 mL/hr over 240 Minutes Intravenous  Once 11/28/16 1821 11/28/16 1933       Subjective: Alert to person only, appears mildly confused, able to answer simple questions  Objective: Vitals:   12/04/16 0800 12/04/16 1026 12/04/16 1058 12/04/16 1408  BP: 115/72 96/68  105/63  Pulse: 96 (!) 103 93 (!) 105  Resp: (!) 31  (!) 28 18  Temp:  99.1 F (37.3 C) 98.4 F (36.9 C)  TempSrc:   Axillary Oral  SpO2: 100%  99% 100%  Weight:      Height:        Intake/Output Summary (Last 24 hours) at 12/04/16 1630 Last data filed at 12/04/16 1200  Gross per 24 hour  Intake             1900 ml  Output             2450 ml  Net             -550 ml   Filed Weights   12/02/16 0500 12/03/16 0500 12/04/16 0500  Weight: 108.1 kg (238 lb 5.1 oz) 108.6 kg (239 lb 6.7 oz) 110.8 kg (244 lb 4.3  oz)    Examination:  General exam:  awake, oriented x 1 Respiratory system: Mild bibasilar crackles Cardiovascular system: irregular rate and rhythm, no murmurs, rubs or gallops auscultated Gastrointestinal system: Abdomen is nondistended, soft and nontender. No organomegaly or masses felt. Normal bowel sounds heard. Central nervous system: Moves all 4 spontaneously Extremities: Right greater than left upper extremity edema, 1+ bilateral pitting edema Skin: No rashes, lesions or ulcers    Data Reviewed: I have personally reviewed following labs and imaging studies  CBC:  Recent Labs Lab 11/28/16 1617 11/29/16 0426  WBC 10.8* 11.8*  NEUTROABS 9.0*  --   HGB 12.9 12.3  HCT 40.5 37.3  MCV 94.0 92.3  PLT 375 345   Basic Metabolic Panel:  Recent Labs Lab 11/28/16 1617 11/29/16 0426 11/30/16 0419 12/02/16 0446  NA 133* 133* 133* 132*  K 4.0 3.6 3.6 3.8  CL 91* 92* 92* 92*  CO2 33* 32 32 31  GLUCOSE 135* 101* 111* 105*  BUN 23* 22* 20 16  CREATININE 0.71 0.67 0.61 0.61  CALCIUM 8.1* 7.9* 7.7* 7.6*  MG  --  1.6*  --   --    GFR: Estimated Creatinine Clearance: 70.4 mL/min (by C-G formula based on SCr of 0.61 mg/dL). Liver Function Tests:  Recent Labs Lab 11/28/16 1617  AST 32  ALT 21  ALKPHOS 55  BILITOT 0.7  PROT 6.7  ALBUMIN 2.1*   No results for input(s): LIPASE, AMYLASE in the last 168 hours. No results for input(s): AMMONIA in the last 168 hours. Coagulation Profile: No results for input(s): INR, PROTIME in the last 168 hours. Cardiac Enzymes:  Recent Labs Lab 11/28/16 1617  TROPONINI <0.03   BNP (last 3 results) No results for input(s): PROBNP in the last 8760 hours. HbA1C: No results for input(s): HGBA1C in the last 72 hours. CBG: No results for input(s): GLUCAP in the last 168 hours. Lipid Profile: No results for input(s): CHOL, HDL, LDLCALC, TRIG, CHOLHDL, LDLDIRECT in the last 72 hours. Thyroid Function Tests: No results for  input(s): TSH, T4TOTAL, FREET4, T3FREE, THYROIDAB in the last 72 hours. Anemia Panel: No results for input(s): VITAMINB12, FOLATE, FERRITIN, TIBC, IRON, RETICCTPCT in the last 72 hours. Urine analysis:    Component Value Date/Time   COLORURINE YELLOW 11/29/2016 0400   APPEARANCEUR CLEAR 11/29/2016 0400   LABSPEC 1.010 11/29/2016 0400   PHURINE 5.5 11/29/2016 0400   GLUCOSEU NEGATIVE 11/29/2016 0400   HGBUR TRACE (A) 11/29/2016 0400   BILIRUBINUR NEGATIVE 11/29/2016 0400   KETONESUR NEGATIVE 11/29/2016 0400   PROTEINUR NEGATIVE 11/29/2016 0400   NITRITE NEGATIVE 11/29/2016 0400   LEUKOCYTESUR NEGATIVE 11/29/2016 0400   Sepsis Labs: @LABRCNTIP (procalcitonin:4,lacticidven:4)  ) Recent Results (from the  past 240 hour(s))  Culture, blood (routine x 2)     Status: None   Collection Time: 11/28/16  7:57 PM  Result Value Ref Range Status   Specimen Description LEFT ANTECUBITAL  Final   Special Requests BOTTLES DRAWN AEROBIC AND ANAEROBIC 6CC  Final   Culture NO GROWTH 5 DAYS  Final   Report Status 12/03/2016 FINAL  Final  Culture, blood (routine x 2)     Status: None   Collection Time: 11/28/16  8:08 PM  Result Value Ref Range Status   Specimen Description BLOOD LEFT HAND  Final   Special Requests BOTTLES DRAWN AEROBIC ONLY 6CC  Final   Culture NO GROWTH 5 DAYS  Final   Report Status 12/03/2016 FINAL  Final  MRSA PCR Screening     Status: None   Collection Time: 11/28/16  8:41 PM  Result Value Ref Range Status   MRSA by PCR NEGATIVE NEGATIVE Final    Comment:        The GeneXpert MRSA Assay (FDA approved for NASAL specimens only), is one component of a comprehensive MRSA colonization surveillance program. It is not intended to diagnose MRSA infection nor to guide or monitor treatment for MRSA infections.          Radiology Studies: No results found.      Scheduled Meds: . acidophilus  1 capsule Oral Daily  . amiodarone  400 mg Oral BID  .  ampicillin-sulbactam (UNASYN) IV  3 g Intravenous Q6H  . apixaban  5 mg Oral BID  . furosemide  40 mg Oral Daily  . guaiFENesin  600 mg Oral BID  . levothyroxine  50 mcg Oral QAC breakfast  . metoprolol tartrate  25 mg Oral TID  . omega-3 acid ethyl esters  1 g Oral Daily  . potassium chloride  10 mEq Oral Daily  . sodium chloride  1 g Oral BID WC   Continuous Infusions:    LOS: 5 days    Time spent: 25 minutes. Greater than 50% of this time was spent in direct contact with the patient coordinating care.     Chaya Jan, MD [facility] Pager [phone]  If 7PM-7AM, please contact night-coverage [email or web address] Password [facility] 12/04/2016, 4:30 PM

## 2016-12-04 NOTE — Care Management Note (Signed)
Case Management Note  Patient Details  Name: Yvonne BellingMary Chan MRN: [id number] Date of Birth: Apr 05, 1929  If discussed at Long Length of Stay Meetings, dates discussed:  12/04/2016  Additional Comments:  Calvert Charland, Chrystine OilerSharley Diane, RN 12/04/2016, 12:31 PM

## 2016-12-04 NOTE — Progress Notes (Signed)
Progress Note  Patient Name: Yvonne Chan Date of Encounter: 12/04/2016  Primary Cardiologist: Prentice Docker MD  Subjective   Denies chest pain. Breathing better. Feels weak.  Inpatient Medications    Scheduled Meds: . acidophilus  1 capsule Oral Daily  . amiodarone  400 mg Oral BID  . ampicillin-sulbactam (UNASYN) IV  3 g Intravenous Q6H  . apixaban  5 mg Oral BID  . furosemide  40 mg Intravenous BID  . guaiFENesin  600 mg Oral BID  . levothyroxine  50 mcg Oral QAC breakfast  . metoprolol tartrate  25 mg Oral BID  . omega-3 acid ethyl esters  1 g Oral Daily  . potassium chloride  10 mEq Oral Daily  . sodium chloride  1 g Oral BID WC    PRN Meds: acetaminophen, ipratropium-albuterol, ondansetron (ZOFRAN) IV   Vital Signs    Vitals:   12/04/16 0500 12/04/16 0600 12/04/16 0739 12/04/16 0800  BP: 108/69 110/76  115/72  Pulse: 87 99 95 96  Resp: (!) 25   (!) 31  Temp:   98.1 F (36.7 C)   TempSrc:   Oral   SpO2: 99% 100% 100% 100%  Weight: 244 lb 4.3 oz (110.8 kg)     Height:        Intake/Output Summary (Last 24 hours) at 12/04/16 0833 Last data filed at 12/04/16 0800  Gross per 24 hour  Intake          2036.34 ml  Output             2350 ml  Net          -313.66 ml   Filed Weights   12/02/16 0500 12/03/16 0500 12/04/16 0500  Weight: 238 lb 5.1 oz (108.1 kg) 239 lb 6.7 oz (108.6 kg) 244 lb 4.3 oz (110.8 kg)    Telemetry    Atrial fibrillation with heart rate 90's to 110 bpm  ECG    I personally reviewed the tracing from 11/28/2016 which showed atrial fibrillation with low voltage and nonspecific T-wave changes.  Physical Exam   GEN: No acute distress.   Neck: No JVD Cardiac: IRRR, tachycardic,  no murmurs, rubs, or gallops.  Respiratory: Diminished in the bases. No wheezes, Difficult respiratory assessment, unable to auscultate bases.  GI: Soft, nontender, non-distended  MS: No edema; No deformity.Bilateral SCD.   Labs     Chemistry  Recent Labs Lab 11/28/16 1617 11/29/16 0426 11/30/16 0419 12/02/16 0446  NA 133* 133* 133* 132*  K 4.0 3.6 3.6 3.8  CL 91* 92* 92* 92*  CO2 33* 32 32 31  GLUCOSE 135* 101* 111* 105*  BUN 23* 22* 20 16  CREATININE 0.71 0.67 0.61 0.61  CALCIUM 8.1* 7.9* 7.7* 7.6*  PROT 6.7  --   --   --   ALBUMIN 2.1*  --   --   --   AST 32  --   --   --   ALT 21  --   --   --   ALKPHOS 55  --   --   --   BILITOT 0.7  --   --   --   GFRNONAA >60 >60 >60 >60  GFRAA >60 >60 >60 >60  ANIONGAP 9 9 9 9      Hematology  Recent Labs Lab 11/28/16 1617 11/29/16 0426  WBC 10.8* 11.8*  RBC 4.31 4.04  HGB 12.9 12.3  HCT 40.5 37.3  MCV 94.0 92.3  MCH 29.9 30.4  MCHC 31.9 33.0  RDW 13.9 13.9  PLT 375 345    Cardiac Enzymes  Recent Labs Lab 11/28/16 1617  TROPONINI <0.03    BNP  Recent Labs Lab 11/28/16 1617  BNP 240.0*     Radiology    No results found.  Cardiac Studies   Echocardiogram 11/02/2016 Left ventricle: The cavity size was normal. Wall thickness was increased in a pattern of moderate LVH. Systolic function was normal. The estimated ejection fraction was in the range of 60% to 65%. - Aortic valve: Mildly calcified annulus. Mildly thickened leaflets. Valve area (VTI): 2.22 cm^2. Valve area (Vmax): 2.15 cm^2. - Mitral valve: Mildly calcified annulus. Mildly thickened leaflets . There was mild regurgitation. - Left atrium: The atrium was moderately dilated. - Right ventricle: The cavity size was mildly dilated. - Right atrium: The atrium was moderately dilated. - Technically difficult study.   Patient Profile     83 y.o. female with reported history of HTN, atrial fibrillation on Eliquis, chronic diastolic CHF, and hypothyroidism who is a resident of the [facility], who presented to ER with complaints of edema, dyspnea and racing heart rate. She has been recently hospitalized in January of 2018,for influenza and CAP, and again this  month for influenza. Found to have atrial fibrillation with RVR. Placed on amiodarone gtt on Friday.   Assessment & Plan    1. Atrial fib with RVR: Heart rate is better controlled. She is now on po amiodarone 400 mg BID, and metoprolol 25 mg BID. No wheezing is noted. Remains on Eliquis 5 mg BID CHADSVASC Score of 3.   2. Hypertension: BP is soft but stable.   3. Diastolic CHF: No evidence of decompensation at this time.   4. HCAP: Per PCP  5. Deconditioning: Recommend PT for strengthening.   Signed, Joni ReiningKathryn Lawrence, NP  12/04/2016, 8:33 AM     Attending note:  Patient seen and examined. She has been converted to an oral cardiac regimen for heart rate control. Blood pressure still low normal to low limiting titration somewhat, but she has tolerated Lopressor. Would reduce Lasix dose and change to oral. Increase Lopressor to 3 times a day. Lungs exhibit decreased breath sounds without wheezing. Cardiac exam with irregularly irregular rhythm. Lab work shows stable renal function.  Jonelle SidleSamuel G. McDowell, M.D., F.A.C.C.

## 2016-12-04 NOTE — Progress Notes (Signed)
Physical Therapy Treatment Patient Details Name: Romero BellingMary Engel MRN: [id number] DOB: 1929-08-28 Today's Date: 12/04/2016    History of Present Illness 83 y.o. female with medical history significant of HTN, PAF on Eliquis, diastolic CHF, and hypothyroidism; who presents from the [facility] for a fast irregular heartbeat noticed this afternoon by staff. According to family present at bedside she has had increasing swelling in her right arms and bilateral legs. Patient complains of feeling short of breath. Review of records shows that the patient has been hospitalized multiple times within the last month1/25-1/30, 1/31-2/9, and then 2/9-2/10. Treated for atrial fibrillation, diastolic CHF, influenza, and community-acquired pneumonia. Other associated symptoms include decreased appetite and generalized weakness. Patient denies any chest pain, dysuria, nausea, vomiting, or abdominal pain. Review of records shows the patient weight back on 2/6 was 224 pounds.   Dx: Diastolic CHF exacerbation/ pulmonary edema: Acute on chronic, and possible HCAP.     PT Comments    Pt received in bed, dtr present, and pt is agreeable to PT tx.  Pt continues to seem withdrawn during PT session today.  When PT asks pt if she can move her LE's, pt's states "uh-huh" however no movement occurs despite PT assisting her with movement, no muscle contraction is palpated.  She requires Max A +2/Total A for transfer supine<>sit, and once sitting on the EOB, she requires Min A to maintain static sitting balance due to posterior lean.  Pt's R UE is extremely edematous, and she does not move the R UE on command.  She will move it when she wants something - per dtr - such as something to drink.  She required a Maxi move/hoyer lift for transfer bed<>chair today due to poor static sitting balance and poor initiation of movement with B LE's.  She is recommended for SNF.      Follow Up Recommendations  SNF     Equipment Recommendations   None recommended by PT    Recommendations for Other Services       Precautions / Restrictions Precautions Precautions: Fall Precaution Comments: Due to immobility and poor safety awareness during mobiltiy.  Restrictions Weight Bearing Restrictions: No    Mobility  Bed Mobility Overal bed mobility: Needs Assistance Bed Mobility: Supine to Sit     Supine to sit: Total assist;Max assist;+2 for physical assistance;+2 for safety/equipment;HOB elevated     General bed mobility comments: Pt requires constant Min A for static sitting balance due to posterior lean.  Transfers Overall transfer level: Needs assistance               General transfer comment: Lift used due to pt's inability to move LE's on her own, and poor static sitting balance.    Ambulation/Gait                 Stairs            Wheelchair Mobility    Modified Rankin (Stroke Patients Only)       Balance Overall balance assessment: Needs assistance Sitting-balance support: Bilateral upper extremity supported;Feet supported Sitting balance-Leahy Scale: Fair Sitting balance - Comments: Pt with extreme head forward position with chin practically resting on chest while sitting on EOB. She demonstrates posterior lean.   Postural control: Posterior lean                          Cognition Arousal/Alertness: Awake/alert Behavior During Therapy: Flat affect Overall Cognitive Status: Difficult to assess  General Comments: Pt is not very talkative, still seems withdrawn even with dtr present.  Pt expresses "uh huh" to all questions.      Exercises General Exercises - Upper Extremity Wrist Extension: AROM;Right;Other (comment) (Pt was able to move fingers and wrist 7x's with extensive encouragement. )    General Comments General comments (skin integrity, edema, etc.): R UE is extremely edematous.        Pertinent Vitals/Pain Pain Assessment: No/denies pain     Home Living                      Prior Function            PT Goals (current goals can now be found in the care plan section) Acute Rehab PT Goals Patient Stated Goal: None stated PT Goal Formulation: With patient Time For Goal Achievement: 12/13/16 Potential to Achieve Goals: Poor Progress towards PT goals: Not progressing toward goals - comment    Frequency    Min 3X/week      PT Plan Current plan remains appropriate    Co-evaluation             End of Session Equipment Utilized During Treatment: Oxygen Activity Tolerance: Patient limited by fatigue Patient left: in chair;with call bell/phone within reach;with family/visitor present;with nursing/sitter in room Nurse Communication: Mobility status;Need for lift equipment (Maxi move sling under patient in the chair. ) PT Visit Diagnosis: Muscle weakness (generalized) (M62.81);Other abnormalities of gait and mobility (R26.89)     Time: 1610-9604 PT Time Calculation (min) (ACUTE ONLY): 26 min  Charges:  $Therapeutic Activity: 23-37 mins                    G Codes:      Beth Alwin Lanigan, PT, DPT X: [phone]

## 2016-12-05 DIAGNOSIS — R531 Weakness: Secondary | ICD-10-CM

## 2016-12-05 DIAGNOSIS — E038 Other specified hypothyroidism: Secondary | ICD-10-CM

## 2016-12-05 DIAGNOSIS — E871 Hypo-osmolality and hyponatremia: Secondary | ICD-10-CM

## 2016-12-05 DIAGNOSIS — I481 Persistent atrial fibrillation: Secondary | ICD-10-CM

## 2016-12-05 DIAGNOSIS — I5033 Acute on chronic diastolic (congestive) heart failure: Secondary | ICD-10-CM

## 2016-12-05 MED ORDER — BISACODYL 10 MG RE SUPP: 10.0000 mg | Freq: Every day | RECTAL | Status: DC | PRN

## 2016-12-05 MED ORDER — AMOXICILLIN-POT CLAVULANATE 875-125 MG PO TABS
1.0000 | ORAL_TABLET | Freq: Two times a day (BID) | ORAL | Status: DC
  Administered 2016-12-06: 1 via ORAL
  Filled 2016-12-05: qty 1

## 2016-12-05 MED ORDER — SENNOSIDES-DOCUSATE SODIUM 8.6-50 MG PO TABS
1.0000 | ORAL_TABLET | Freq: Two times a day (BID) | ORAL | Status: DC
Start: 2016-12-05 — End: 2016-12-06
  Administered 2016-12-05 – 2016-12-06 (×2): 1 via ORAL
  Filled 2016-12-05 (×3): qty 1

## 2016-12-05 MED ORDER — METOPROLOL TARTRATE 25 MG PO TABS
37.5000 mg | ORAL_TABLET | Freq: Three times a day (TID) | ORAL | Status: DC
  Administered 2016-12-05 (×2): 37.5 mg via ORAL
  Filled 2016-12-05 (×2): qty 2

## 2016-12-05 MED ORDER — POLYETHYLENE GLYCOL 3350 17 G PO PACK
17.0000 g | PACK | Freq: Every day | ORAL | Status: DC
  Administered 2016-12-05: 17 g via ORAL
  Filled 2016-12-05: qty 1

## 2016-12-05 NOTE — Progress Notes (Addendum)
Progress Note  Patient Name: Yvonne Chan Date of Encounter: 12/05/2016  Primary Cardiologist: Dr. Prentice Docker  Subjective   Generally weak. No chest pain or breathlessness at rest. Moved out from ICU to telemetry yesterday.  Inpatient Medications    Scheduled Meds: . acidophilus  1 capsule Oral Daily  . amiodarone  400 mg Oral BID  . ampicillin-sulbactam (UNASYN) IV  3 g Intravenous Q6H  . apixaban  5 mg Oral BID  . furosemide  40 mg Oral Daily  . guaiFENesin  600 mg Oral BID  . levothyroxine  50 mcg Oral QAC breakfast  . metoprolol tartrate  25 mg Oral TID  . omega-3 acid ethyl esters  1 g Oral Daily  . potassium chloride  10 mEq Oral Daily  . sodium chloride  1 g Oral BID WC    PRN Meds: acetaminophen, ipratropium-albuterol, ondansetron (ZOFRAN) IV   Vital Signs    Vitals:   12/04/16 1731 12/04/16 2053 12/05/16 0500 12/05/16 0538  BP: 115/73 105/72  111/70  Pulse: 92 90  99  Resp:  18    Temp:  98.7 F (37.1 C)  98.1 F (36.7 C)  TempSrc:  Oral  Oral  SpO2:  97%  96%  Weight:   244 lb 4.3 oz (110.8 kg)   Height:        Intake/Output Summary (Last 24 hours) at 12/05/16 1004 Last data filed at 12/04/16 2200  Gross per 24 hour  Intake              740 ml  Output             1350 ml  Net             -610 ml   Filed Weights   12/03/16 0500 12/04/16 0500 12/05/16 0500  Weight: 239 lb 6.7 oz (108.6 kg) 244 lb 4.3 oz (110.8 kg) 244 lb 4.3 oz (110.8 kg)    Telemetry    Telemetry shows atrial fibrillation, heart rate 90-105. Personally reviewed.  Physical Exam   GEN: Elderly obese woman. No acute distress.   Neck: No JVD. Cardiac:  Irregularly irregular, distant, no murmur.  Respiratory: Nonlabored. Decreased breath sounds at the bases. GI: Soft, nontender, bowel sounds present. MS: No edema; No deformity.  Labs    Chemistry Recent Labs Lab 11/28/16 1617 11/29/16 0426 11/30/16 0419 12/02/16 0446  NA 133* 133* 133* 132*  K 4.0 3.6 3.6  3.8  CL 91* 92* 92* 92*  CO2 33* 32 32 31  GLUCOSE 135* 101* 111* 105*  BUN 23* 22* 20 16  CREATININE 0.71 0.67 0.61 0.61  CALCIUM 8.1* 7.9* 7.7* 7.6*  PROT 6.7  --   --   --   ALBUMIN 2.1*  --   --   --   AST 32  --   --   --   ALT 21  --   --   --   ALKPHOS 55  --   --   --   BILITOT 0.7  --   --   --   GFRNONAA >60 >60 >60 >60  GFRAA >60 >60 >60 >60  ANIONGAP 9 9 9 9      Hematology Recent Labs Lab 11/28/16 1617 11/29/16 0426  WBC 10.8* 11.8*  RBC 4.31 4.04  HGB 12.9 12.3  HCT 40.5 37.3  MCV 94.0 92.3  MCH 29.9 30.4  MCHC 31.9 33.0  RDW 13.9 13.9  PLT 375 345  Radiology    No results found.  Cardiac Studies   Echocardiogram 11/02/2016: Study Conclusions  - Left ventricle: The cavity size was normal. Wall thickness was   increased in a pattern of moderate LVH. Systolic function was   normal. The estimated ejection fraction was in the range of 60%   to 65%. - Aortic valve: Mildly calcified annulus. Mildly thickened   leaflets. Valve area (VTI): 2.22 cm^2. Valve area (Vmax): 2.15   cm^2. - Mitral valve: Mildly calcified annulus. Mildly thickened leaflets   . There was mild regurgitation. - Left atrium: The atrium was moderately dilated. - Right ventricle: The cavity size was mildly dilated. - Right atrium: The atrium was moderately dilated. - Technically difficult study.  Patient Profile     83 y.o. female with history of HTN, atrial fibrillation on Eliquis, chronic diastolic CHF, and hypothyroidism who is a [facility] of the [facility], presented to ER with complaints of edema, dyspnea and racing heart rate. She has been recently hospitalized in January of 2018 for influenza and CAP, and again this month for influenza. Found to have atrial fibrillation with RVR. Medications are being adjusted for better heart rate control.  Assessment & Plan    1. Persistent atrial fibrillation with RVR. Heart rate control has improved, now on oral amiodarone load and  metoprolol which is being uptitrated. She also continues on Eliquis for stroke prophylaxis with CHADSVASC score of 3.  2. Recent diagnoses including community-acquired pneumonia and influenza.  3. Deconditioning. PT working with patient as tolerated.  4. Acute on chronic diastolic heart failure, transitioned to oral Lasix. LVEF 60-65%. Continues to diurese slowly with stable renal function.  Continue amiodarone 400 mg twice daily, will change to 200 mg twice daily on Friday. Continue Eliquis for stroke prophylaxis. Lopressor will be increased to 37.5 mg TID. No change in current diuretic regimen.  Signed, Nona DellSamuel Lycia Sachdeva, MD  12/05/2016, 10:04 AM

## 2016-12-05 NOTE — Progress Notes (Signed)
PROGRESS NOTE    Yvonne Chan  WJX:[id number] DOB: 1929/07/26 DOA: 11/28/2016 PCP: Phyllis Ginger     Brief Narrative:  83 year old woman admitted from the [facility] on 2/21 after she was noted to have an elevated heart rate. She has also been noticed to have right upper extremity edema and some mild shortness of breath. She was recently hospitalized for influenza and has had a few recent admissions for pneumonia, presumed hospital-acquired. Admission has been requested for further evaluation and management.   Assessment & Plan:   Principal Problem:   A-fib (HCC) Active Problems:   Hypothyroidism   Acute diastolic CHF (congestive heart failure) (HCC)   Hyponatremia   Weakness   Swelling of right upper extremity   Atrial fibrillation with rapid ventricular response -Heart rate remains uncontrolled. BP remains low-normal. -Continue amiodarone PO. -Cardiology is increasing lopressor dose and decreasing lasix to allow for BB titration. -Patient is anticoagulated on Eliquis and this will be continued.  Right upper extremity edema -Doppler without evidence for DVT.  Hospital-acquired pneumonia/aspiration pneumonia -Has had frequent admissions for same. -I suspect there may be a component of aspiration and have requested a speech therapy evaluation. They're recommending a dysphagia 2 diet. -MRSA PCR is negative, will discontinue vancomycin for now.  -Continue Unasyn to cover aspiration pathogens. -Can transition over to PO over next 24-48 hours for an extra 2-3 days.  Acute on chronic diastolic CHF -Patient appears volume overloaded on exam. Echo in January 2018 shows an ejection fraction of 60-65%. -Suspect pulmonary edema noted on chest x-ray is likely related to rapid rate with her A. Fib. - she is 1.9 L negative since admission. Lasix dose has been decreased today.   DVT prophylaxis: Eliquis Code Status: Full Code Family Communication: patient  only Disposition Plan: Hopefully can discharge in next few days  Consultants:   Cardiology  Procedures:   None  Antimicrobials:  Anti-infectives    Start     Dose/Rate Route Frequency Ordered Stop   11/29/16 1800  Ampicillin-Sulbactam (UNASYN) 3 g in sodium chloride 0.9 % 100 mL IVPB     3 g 200 mL/hr over 30 Minutes Intravenous Every 6 hours 11/29/16 1519     11/29/16 1000  vancomycin (VANCOCIN) IVPB 750 mg/150 ml premix  Status:  Discontinued     750 mg 150 mL/hr over 60 Minutes Intravenous Every 12 hours 11/28/16 2130 11/29/16 1510   11/29/16 0000  ceFEPIme (MAXIPIME) 2 g in dextrose 5 % 50 mL IVPB  Status:  Discontinued     2 g 100 mL/hr over 30 Minutes Intravenous Every 8 hours 11/28/16 2130 11/29/16 1510   11/28/16 2200  vancomycin (VANCOCIN) IVPB 1000 mg/200 mL premix     1,000 mg 200 mL/hr over 60 Minutes Intravenous  Once 11/28/16 2130 11/28/16 2310   11/28/16 1830  vancomycin (VANCOCIN) IVPB 1000 mg/200 mL premix     1,000 mg 200 mL/hr over 60 Minutes Intravenous  Once 11/28/16 1821 11/28/16 2112   11/28/16 1830  piperacillin-tazobactam (ZOSYN) IVPB 3.375 g     3.375 g 12.5 mL/hr over 240 Minutes Intravenous  Once 11/28/16 1821 11/28/16 1933       Subjective: Alert to person only, appears mildly confused, able to answer simple questions  Objective: Vitals:   12/04/16 1731 12/04/16 2053 12/05/16 0500 12/05/16 0538  BP: 115/73 105/72  111/70  Pulse: 92 90  99  Resp:  18    Temp:  98.7 F (37.1 C)  98.1 F (36.7 C)  TempSrc:  Oral  Oral  SpO2:  97%  96%  Weight:   110.8 kg (244 lb 4.3 oz)   Height:        Intake/Output Summary (Last 24 hours) at 12/05/16 1202 Last data filed at 12/04/16 2200  Gross per 24 hour  Intake              240 ml  Output              350 ml  Net             -110 ml   Filed Weights   12/03/16 0500 12/04/16 0500 12/05/16 0500  Weight: 108.6 kg (239 lb 6.7 oz) 110.8 kg (244 lb 4.3 oz) 110.8 kg (244 lb 4.3 oz)     Examination:  General exam:  awake, oriented x 1 Respiratory system: Mild bibasilar crackles Cardiovascular system: irregular rate and rhythm, no murmurs, rubs or gallops auscultated Gastrointestinal system: Abdomen is nondistended, soft and nontender. No organomegaly or masses felt. Normal bowel sounds heard. Central nervous system: Moves all 4 spontaneously Extremities: Right greater than left upper extremity edema, 1+ bilateral pitting edema Skin: No rashes, lesions or ulcers  Data Reviewed: I have personally reviewed following labs and imaging studies  CBC:  Recent Labs Lab 11/28/16 1617 11/29/16 0426  WBC 10.8* 11.8*  NEUTROABS 9.0*  --   HGB 12.9 12.3  HCT 40.5 37.3  MCV 94.0 92.3  PLT 375 345   Basic Metabolic Panel:  Recent Labs Lab 11/28/16 1617 11/29/16 0426 11/30/16 0419 12/02/16 0446  NA 133* 133* 133* 132*  K 4.0 3.6 3.6 3.8  CL 91* 92* 92* 92*  CO2 33* 32 32 31  GLUCOSE 135* 101* 111* 105*  BUN 23* 22* 20 16  CREATININE 0.71 0.67 0.61 0.61  CALCIUM 8.1* 7.9* 7.7* 7.6*  MG  --  1.6*  --   --    GFR: Estimated Creatinine Clearance: 70.4 mL/min (by C-G formula based on SCr of 0.61 mg/dL). Liver Function Tests:  Recent Labs Lab 11/28/16 1617  AST 32  ALT 21  ALKPHOS 55  BILITOT 0.7  PROT 6.7  ALBUMIN 2.1*   No results for input(s): LIPASE, AMYLASE in the last 168 hours. No results for input(s): AMMONIA in the last 168 hours. Coagulation Profile: No results for input(s): INR, PROTIME in the last 168 hours. Cardiac Enzymes:  Recent Labs Lab 11/28/16 1617  TROPONINI <0.03   BNP (last 3 results) No results for input(s): PROBNP in the last 8760 hours. HbA1C: No results for input(s): HGBA1C in the last 72 hours. CBG: No results for input(s): GLUCAP in the last 168 hours. Lipid Profile: No results for input(s): CHOL, HDL, LDLCALC, TRIG, CHOLHDL, LDLDIRECT in the last 72 hours. Thyroid Function Tests: No results for input(s): TSH,  T4TOTAL, FREET4, T3FREE, THYROIDAB in the last 72 hours. Anemia Panel: No results for input(s): VITAMINB12, FOLATE, FERRITIN, TIBC, IRON, RETICCTPCT in the last 72 hours. Urine analysis:    Component Value Date/Time   COLORURINE YELLOW 11/29/2016 0400   APPEARANCEUR CLEAR 11/29/2016 0400   LABSPEC 1.010 11/29/2016 0400   PHURINE 5.5 11/29/2016 0400   GLUCOSEU NEGATIVE 11/29/2016 0400   HGBUR TRACE (A) 11/29/2016 0400   BILIRUBINUR NEGATIVE 11/29/2016 0400   KETONESUR NEGATIVE 11/29/2016 0400   PROTEINUR NEGATIVE 11/29/2016 0400   NITRITE NEGATIVE 11/29/2016 0400   LEUKOCYTESUR NEGATIVE 11/29/2016 0400    Recent Results (from the past 240 hour(s))  Culture, blood (routine x 2)     Status: None   Collection Time: 11/28/16  7:57 PM  Result Value Ref Range Status   Specimen Description LEFT ANTECUBITAL  Final   Special Requests BOTTLES DRAWN AEROBIC AND ANAEROBIC 6CC  Final   Culture NO GROWTH 5 DAYS  Final   Report Status 12/03/2016 FINAL  Final  Culture, blood (routine x 2)     Status: None   Collection Time: 11/28/16  8:08 PM  Result Value Ref Range Status   Specimen Description BLOOD LEFT HAND  Final   Special Requests BOTTLES DRAWN AEROBIC ONLY 6CC  Final   Culture NO GROWTH 5 DAYS  Final   Report Status 12/03/2016 FINAL  Final  MRSA PCR Screening     Status: None   Collection Time: 11/28/16  8:41 PM  Result Value Ref Range Status   MRSA by PCR NEGATIVE NEGATIVE Final    Comment:        The GeneXpert MRSA Assay (FDA approved for NASAL specimens only), is one component of a comprehensive MRSA colonization surveillance program. It is not intended to diagnose MRSA infection nor to guide or monitor treatment for MRSA infections.     Radiology Studies: No results found.  Scheduled Meds: . acidophilus  1 capsule Oral Daily  . amiodarone  400 mg Oral BID  . ampicillin-sulbactam (UNASYN) IV  3 g Intravenous Q6H  . apixaban  5 mg Oral BID  . furosemide  40 mg Oral  Daily  . guaiFENesin  600 mg Oral BID  . levothyroxine  50 mcg Oral QAC breakfast  . metoprolol tartrate  37.5 mg Oral TID  . omega-3 acid ethyl esters  1 g Oral Daily  . potassium chloride  10 mEq Oral Daily  . sodium chloride  1 g Oral BID WC   Continuous Infusions:   LOS: 6 days   Time spent: 25 minutes. Greater than 50% of this time was spent in direct contact with the patient coordinating care.  Standley Dakinslanford Keyshon Stein, MD [facility] Pager [phone]  If 7PM-7AM, please contact night-coverage [email or web address] Password [email or web address] 12/05/2016, 12:02 PM

## 2016-12-05 NOTE — Progress Notes (Signed)
Pharmacy Antibiotic Note  Yvonne BellingMary Chan is a 83 y.o. female admitted on 11/28/2016 with aspiration pneumonia.  Pharmacy has been consulted for unasyn dosing.  Plan: Cont Unasyn 3gm IV q6h Noted plan to change to po antibiotics soon  Height: 5\' 11"  (180.3 cm) Weight: 244 lb 4.3 oz (110.8 kg) IBW/kg (Calculated) : 70.8  Temp (24hrs), Avg:98.4 F (36.9 C), Min:98.1 F (36.7 C), Max:98.7 F (37.1 C)   Recent Labs Lab 11/28/16 1617 11/28/16 1646 11/29/16 0426 11/29/16 1045 11/30/16 0419 12/02/16 0446  WBC 10.8*  --  11.8*  --   --   --   CREATININE 0.71  --  0.67  --  0.61 0.61  LATICACIDVEN  --  2.1*  --  1.4  --   --     Normalized CrCl 4865mls/min  Estimated Creatinine Clearance: 70.4 mL/min (by C-G formula based on SCr of 0.61 mg/dL).    No Known Allergies  Antimicrobials this admission: Unasyn 2/22>>  Vancomycin 2/21 >> 2/22  Cefepime 2/21>>2/22 Zosyn 2/21 x 1 in ED  Dose adjustments this admission: N/A  Microbiology results: 2/21 BCx: ng final 2/1 MRSA PCR: negative  Thank you for allowing pharmacy to be a part of this patient's care.  Talbert CageLora Gery Sabedra, PharmD Clinical Pharmacist  12/05/2016 11:04 AM

## 2016-12-06 DIAGNOSIS — M7989 Other specified soft tissue disorders: Secondary | ICD-10-CM

## 2016-12-06 LAB — CBC
HCT: 42.4 % (ref 36.0–46.0)
Hemoglobin: 13.8 g/dL (ref 12.0–15.0)
MCH: 30.1 pg (ref 26.0–34.0)
MCHC: 32.5 g/dL (ref 30.0–36.0)
MCV: 92.4 fL (ref 78.0–100.0)
PLATELETS: 324 10*3/uL (ref 150–400)
RBC: 4.59 MIL/uL (ref 3.87–5.11)
RDW: 13.8 % (ref 11.5–15.5)
WBC: 13.5 10*3/uL — ABNORMAL HIGH (ref 4.0–10.5)

## 2016-12-06 LAB — COMPREHENSIVE METABOLIC PANEL
ALT: 13 U/L — AB (ref 14–54)
ANION GAP: 7 (ref 5–15)
AST: 28 U/L (ref 15–41)
Albumin: 1.6 g/dL — ABNORMAL LOW (ref 3.5–5.0)
Alkaline Phosphatase: 52 U/L (ref 38–126)
BUN: 19 mg/dL (ref 6–20)
CHLORIDE: 90 mmol/L — AB (ref 101–111)
CO2: 37 mmol/L — AB (ref 22–32)
CREATININE: 0.61 mg/dL (ref 0.44–1.00)
Calcium: 7.8 mg/dL — ABNORMAL LOW (ref 8.9–10.3)
Glucose, Bld: 110 mg/dL — ABNORMAL HIGH (ref 65–99)
Potassium: 3.5 mmol/L (ref 3.5–5.1)
Sodium: 134 mmol/L — ABNORMAL LOW (ref 135–145)
Total Bilirubin: 0.7 mg/dL (ref 0.3–1.2)
Total Protein: 5.7 g/dL — ABNORMAL LOW (ref 6.5–8.1)

## 2016-12-06 MED ORDER — POLYETHYLENE GLYCOL 3350 17 G PO PACK: 17.0000 g | PACK | Freq: Every day | ORAL | 0 refills | Status: AC | PRN

## 2016-12-06 MED ORDER — METOPROLOL TARTRATE 50 MG PO TABS
50.0000 mg | ORAL_TABLET | Freq: Three times a day (TID) | ORAL | Status: DC
  Administered 2016-12-06: 50 mg via ORAL
  Filled 2016-12-06: qty 1

## 2016-12-06 MED ORDER — METOPROLOL TARTRATE 25 MG PO TABS: 25.0000 mg | ORAL_TABLET | Freq: Three times a day (TID) | ORAL | 2 refills | Status: DC

## 2016-12-06 MED ORDER — AMIODARONE HCL 200 MG PO TABS: 200.0000 mg | ORAL_TABLET | Freq: Two times a day (BID) | ORAL | Status: DC

## 2016-12-06 MED ORDER — AMIODARONE HCL 200 MG PO TABS: ORAL_TABLET | ORAL | Status: AC

## 2016-12-06 MED ORDER — AMOXICILLIN-POT CLAVULANATE 875-125 MG PO TABS: 1.0000 | ORAL_TABLET | Freq: Two times a day (BID) | ORAL | 0 refills | Status: AC

## 2016-12-06 NOTE — Discharge Summary (Signed)
Physician Discharge Summary  Yvonne Chan ZOX:[id number] DOB: 1929-01-24 DOA: 11/28/2016  PCP: Abran Richard, PA-C  Admit date: 11/28/2016 Discharge date: 12/06/2016  Admitted From: SNF Disposition:  SNF   Recommendations for Outpatient Follow-up:  1. Follow up with PCP in 1-2 weeks 2. Please obtain BMP/CBC in one week  Discharge Condition: STABLE  CODE STATUS: FULL   Brief/Interim Summary: HPI: Yvonne Chan is a 83 y.o. female with medical history significant of HTN, PAF on Eliquis, diastolic CHF, and hypothyroidism; who presents from the [facility] for a fast irregular heartbeat noticed this afternoon by staff. According to family present at bedside she has had increasing swelling in her right arms and bilateral legs. Patient complains of feeling short of breath. Review of records shows that the patient has been hospitalized multiple times within the last month1/25-1/30, 1/31-2/9, and then 2/9-2/10. Treated for atrial fibrillation, diastolic CHF, influenza, and community-acquired pneumonia. Other associated symptoms include decreased appetite and generalized weakness. Patient denies any chest pain, dysuria, nausea, vomiting, or abdominal pain. Review of records shows the patient weight back on 2/6 was 224 pounds.   ED Course: Upon admission into the emergency department patient was seen to be afebrile, pulse 120-140s, respirations 20-34, blood pressure 96/71-131/80, and O2 saturations maintained at room air. Lab work revealed WBC 10.8, sodium 133, chloride 91, CO2 33, BUN 23, creatinine 0.71, calcium 8.1, glucose 135, troponin 0.03, and BNP 240. Due to the patient's elevated heart rates she was placed on a diltiazem drip. TRH called to admit. Recommended empiric antibiotics of vancomycin and Zosyn for possibility of a healthcare associated pneumonia.  Atrial fibrillation with rapid ventricular response (resolved now) -Heart rate remains uncontrolled. BP remains low-normal. -Continue amiodarone  PO. -Cardiology is increasing lopressor dose -Patient is anticoagulated on Eliquis and this will be continued.  Right upper extremity edema -Doppler without evidence for DVT.  Hospital-acquired pneumonia/aspiration pneumonia -Has had frequent admissions for same. -I suspect there may be a component of aspiration and have requested a speech therapy evaluation. They're recommending a dysphagia 2 diet. -MRSA PCR is negative, will discontinue vancomycin for now.  -Given Unasyn to cover aspiration pathogens. -Continue augmentin for next 3 days.  Acute on chronic diastolic CHF -Patient appears volume overloaded on exam. Echo in January 2018 shows an ejection fraction of 60-65%. -Suspect pulmonary edema noted on chest x-ray is likely related to rapid rate with her A. Fib. - she is 2.6 L negative since admission.   DVT prophylaxis: Eliquis Code Status: Full Code Family Communication: patient only Disposition Plan: Hopefully can discharge in next few days  Consultants:   Cardiology (notes below)  1. Persistent atrial fibrillation with RVR. Heart rate control has improved, now on oral amiodarone load and metoprolol which is being further uptitrated - blood pressure stable. She also continues on Eliquis for stroke prophylaxis with CHADSVASC score of 3.  2. Recent diagnoses including community-acquired pneumonia and influenza.  3. Deconditioning. PT working with patient as tolerated.  4. Acute on chronic diastolic heart failure, transitioned to oral Lasix. LVEF 60-65%. Continues to diurese slowly with stable renal function.  Continue amiodarone 400 mg twice daily, will change to 200 mg twice daily on Friday. Continue Eliquis for stroke prophylaxis. Lopressor will be increased to 50 mg TID. No change in current diuretic regimen.  Discharge Diagnoses:  Principal Problem:   A-fib [facility]) Active Problems:   Hypothyroidism   Acute diastolic CHF (congestive heart failure) (HCC)    Hyponatremia   Weakness  Swelling of right upper extremity  Discharge Instructions  Discharge Instructions    Diet - low sodium heart healthy    Complete by:  As directed    Increase activity slowly    Complete by:  As directed      Allergies as of 12/06/2016   No Known Allergies     Medication List    STOP taking these medications   amLODipine 5 MG tablet Commonly known as:  NORVASC   levofloxacin 250 MG tablet Commonly known as:  LEVAQUIN   methyldopa 250 MG tablet Commonly known as:  ALDOMET     TAKE these medications   acetaminophen 325 MG tablet Commonly known as:  TYLENOL Take 650 mg by mouth every 6 (six) hours as needed.   amiodarone 200 MG tablet Commonly known as:  PACERONE Take 1 po BID x 1 week, then 1 po daily   amoxicillin-clavulanate 875-125 MG tablet Commonly known as:  AUGMENTIN Take 1 tablet by mouth every 12 (twelve) hours.   apixaban 5 MG Tabs tablet Commonly known as:  ELIQUIS Take 1 tablet (5 mg total) by mouth 2 (two) times daily.   Fish Oil 1000 MG Caps Take 1 capsule by mouth daily.   furosemide 40 MG tablet Commonly known as:  LASIX Take 1 tablet (40 mg total) by mouth daily.   ipratropium-albuterol 0.5-2.5 (3) MG/3ML Soln Commonly known as:  DUONEB Take 3 mLs by nebulization every 6 (six) hours as needed (shortness of breath).   levothyroxine 50 MCG tablet Commonly known as:  SYNTHROID, LEVOTHROID Take 50 mcg by mouth daily before breakfast.   metoprolol tartrate 25 MG tablet Commonly known as:  LOPRESSOR Take 1 tablet (25 mg total) by mouth 3 (three) times daily. What changed:  when to take this   OXYGEN Inhale 2 L into the lungs daily as needed. To keep sats greater or equal to 90%   polyethylene glycol packet Commonly known as:  MIRALAX / GLYCOLAX Take 17 g by mouth daily as needed for moderate constipation.   potassium chloride 10 MEQ tablet Commonly known as:  K-DUR Take 10 mEq by mouth daily.   RISA-BID  PROBIOTIC Tabs Take 1 tablet by mouth twice a day   sodium chloride 1 g tablet Take 1 tablet (1 g total) by mouth 2 (two) times daily with a meal.      Follow-up Information    BAUCOM, JENNY B, PA-C. Schedule an appointment as soon as possible for a visit in 2 week(s).   Specialty:  Physician Assistant Contact information: [address] [place] [address] [phone]        Prentice Docker, MD. Schedule an appointment as soon as possible for a visit in 2 week(s).   Specialty:  Cardiology Contact information: [address] [place] [address] [phone]          No Known Allergies  Procedures/Studies: Dg Chest 2 View  Result Date: 11/17/2016 CLINICAL DATA:  Recent positive test for flu, shortness of breath EXAM: CHEST  2 VIEW COMPARISON:  11/10/2016 FINDINGS: Stable cardiomegaly and tiny effusions. No interval consolidation. No pneumothorax. Mild linear atelectasis in the left upper lobe. IMPRESSION: Cardiomegaly with tiny effusions. No interval development of acute focal pulmonary infiltrate Electronically Signed   By: Jasmine Pang M.D.   On: 11/17/2016 01:39   Dg Chest 2 View  Result Date: 11/10/2016 CLINICAL DATA:  Heart failure.  Shortness of breath. EXAM: CHEST  2 VIEW COMPARISON:  November 07, 2014 FINDINGS: Cardiomegaly with mild pulmonary venous congestion. Probable tiny effusions. No significant interval change. IMPRESSION: Cardiomegaly with mild pulmonary venous congestion and tiny effusions. Electronically Signed   By: Gerome Samavid  Williams III M.D   On: 11/10/2016 12:47   Dg Chest 2 View  Result Date: 11/07/2016 CLINICAL DATA:  Short of breath and chest pain EXAM: CHEST  2 VIEW COMPARISON:  11/01/2016 FINDINGS: Cardiac enlargement with mild vascular congestion. Negative for edema or effusion. Mild left lower lobe atelectasis. Thoracic degenerative changes. IMPRESSION: Cardiac enlargement with mild vascular congestion. Mild left lower lobe atelectasis  Electronically Signed   By: Marlan Palauharles  Clark M.D.   On: 11/07/2016 15:31   [place] Venous Img Upper Uni Right  Result Date: 11/29/2016 CLINICAL DATA:  Right arm swelling EXAM: Right UPPER EXTREMITY VENOUS DOPPLER ULTRASOUND TECHNIQUE: Gray-scale sonography with graded compression, as well as color Doppler and duplex ultrasound were performed to evaluate the upper extremity deep venous system from the level of the subclavian vein and including the jugular, axillary, basilic, radial, ulnar and upper cephalic vein. Spectral Doppler was utilized to evaluate flow at rest and with distal augmentation maneuvers. COMPARISON:  None. FINDINGS: Contralateral Subclavian Vein: Respiratory phasicity is normal and symmetric with the symptomatic side. No evidence of thrombus. Normal compressibility. Internal Jugular Vein: No evidence of thrombus. Normal compressibility, respiratory phasicity and response to augmentation. Subclavian Vein: No evidence of thrombus. Normal compressibility, respiratory phasicity and response to augmentation. Axillary Vein: No evidence of thrombus. Normal compressibility, respiratory phasicity and response to augmentation. Cephalic Vein: No evidence of thrombus. Normal compressibility, respiratory phasicity and response to augmentation. Basilic Vein: No evidence of thrombus. Normal compressibility, respiratory phasicity and response to augmentation. Brachial Veins: No evidence of thrombus. Normal compressibility, respiratory phasicity and response to augmentation. Radial Veins: No evidence of thrombus. Normal compressibility, respiratory phasicity and response to augmentation. Ulnar Veins: No evidence of thrombus. Normal compressibility, respiratory phasicity and response to augmentation. Venous Reflux:  None visualized. Other Findings:  None visualized. IMPRESSION: No evidence of deep venous thrombosis. Electronically Signed   By: Marlan Palauharles  Clark M.D.   On: 11/29/2016 11:58   Dg Chest Port 1  View  Result Date: 11/28/2016 CLINICAL DATA:  Acute onset of shortness of breath and tachycardia. Right-sided soft tissue edema. Initial encounter. EXAM: PORTABLE CHEST 1 VIEW COMPARISON:  Chest radiograph performed 11/17/2016 FINDINGS: The lungs are well-aerated. Left basilar opacity may reflect a combination of airspace opacification and small left pleural effusion. Underlying vascular congestion is noted. No pneumothorax is seen. Right paratracheal prominence is again noted. If not previously assessed, CT of the chest could be considered for further evaluation on an elective nonemergent basis. The heart is mildly enlarged. No acute osseous abnormalities are seen. IMPRESSION: 1. Left basilar opacity may reflect a combination of airspace opacification and small left pleural effusion. Pneumonia or possibly mild asymmetric interstitial edema could have such an appearance. 2. Underlying vascular congestion and mild cardiomegaly. 3. Right paratracheal prominence again noted. If not previously assessed, CT of the chest could be considered for further evaluation on an elective nonemergent basis. Electronically Signed   By: Roanna RaiderJeffery  Chang M.D.   On: 11/28/2016 16:35   Dg Swallowing Func-speech Pathology  Result Date: 11/29/2016 Objective Swallowing Evaluation: Type of Study: MBS-Modified Barium Swallow Study Patient Details Name: Yvonne BellingMary Bolick MRN: [id number] Date of Birth: 07-07-1929 Today's Date: 11/29/2016 Time: SLP Start Time (ACUTE ONLY): 1337-SLP Stop Time (ACUTE ONLY): 1405 SLP Time Calculation (min) (ACUTE ONLY): 28 min Past  Medical History: Past Medical History: Diagnosis Date . Atrial fibrillation (HCC)  . Essential hypertension  . Hypothyroidism  . Lumbago  . Neuralgia  . Neuritis  Past Surgical History: Past Surgical History: Procedure Laterality Date . ABDOMINAL HYSTERECTOMY   . APPENDECTOMY   HPI: Diamonds Lippard a 83 y.o.femalewith medical history significant ofHTN, PAF on Eliquis, diastolic CHF, and  hypothyroidism; who presents from the [facility] for a fastirregular heartbeat noticed this afternoon by staff. According to family present at bedside she has had increasing swelling in her right arms and bilateral legs. Patient complains of feeling short of breath. Review of records shows that the patient has beenhospitalized multiple times within the last month1/25-1/30, 1/31-2/9, and then 2/9-2/10. Treated for atrial fibrillation, diastolic CHF, influenza, and community-acquired pneumonia. Other associated symptoms include decreased appetite and generalized weakness. Patient denies any chest pain, dysuria, nausea, vomiting, orabdominal pain. Review of records shows the patient weight back on 2/6was 224 pounds. Upon admission into the emergency department patient was seen to be afebrile, pulse 120-140s, respirations 20-34, blood pressure 96/71-131/80, and O2 saturations maintained at room air. Lab work revealed WBC 10.8, sodium 133, chloride 91, CO2 33, BUN 23, creatinine 0.71, calcium 8.1, glucose 135, troponin 0.03, andBNP 240. Due to the patient's elevated heart rates she was placed on a diltiazem drip. TRH called to admit. Recommended empiric antibiotics of vancomycin and Zosyn for possibility of a healthcare associated pneumonia. SLP ordered for BSE due to concerns about aspiration risk. Subjective: "Okay." Assessment / Plan / Recommendation CHL IP CLINICAL IMPRESSIONS 11/29/2016 Clinical Impression Pt presents with mild/mod oral phase, mild pharyngeal phase, and suspected primary esophageal phase dysphagia. Oral phase is marked by reduced lingual manipulation of bolus resulting in decreased bolus cohesiveness with decreased anterior posterior oral transit, premature spillage over base of tongue with liquids, piecemeal deglutition with liquid oral residues after initial/primary swallow. Swallow initiation is triggered after spilling/filling the pyriforms with cup/straw sips of thin and cup sips of NTL.  Pt demonstrates adequate hyolaryngeal excursion, epiglottic deflection, and airway protection with no penetration or aspiration observed over course of study. Pt does have oral residuals after initial bolus/swallow of liquids which trickle to valleculae and pyriforms after the primary swallow which pt does not immediately sense, however these were never penetrated or aspirated when SLP observed without cuing her to swallow to clear. She does eventually spontaneously clear, however these residuals do pose a risk for penetration/aspiration. These residuals in pharynx can be cleared with caregiver cue to swallow two times for each sip of liquid. Esophageal sweep revealed stasis of barium/bolus in distal third of esophagus likely due to dysmotility. The barium tablet traversed esophagus and through LES without significant delay. Occasionally, backflow of bolus noted to move from cervical esophagus to UES, but was not noted to reflux back into pyriforms. Pt's greatest risks for aspiration appear related to liquid oral residuals pooling in pharynx after the primary swallow and also stasis in esophagus (threat of laryngopharyngeal reflux), however not observed today. Risks can be minimized by consuming soft textures (ok for mech soft however pt seems to prefer puree clinically) and thin liquids in upright position and remaining upright at least 30 minutes after meals with caregiver cues to swallow twice for each sip (whether cup or straw). Pt was assessed with nectar-thick liquids and found not to be better tolerated than thins. Results of MBSS reviewed with Pt's daughter. SLP Visit Diagnosis Dysphagia, oropharyngeal phase (R13.12);Dysphagia, pharyngeal phase (R13.13) Attention and concentration deficit following --  Frontal lobe and executive function deficit following -- Impact on safety and function Mild aspiration risk   CHL IP TREATMENT RECOMMENDATION 11/29/2016 Treatment Recommendations Therapy as outlined in treatment  plan below   Prognosis 11/29/2016 Prognosis for Safe Diet Advancement Fair Barriers to Reach Goals Behavior Barriers/Prognosis Comment -- CHL IP DIET RECOMMENDATION 11/29/2016 SLP Diet Recommendations Dysphagia 2 (Fine chop) solids;Thin liquid Liquid Administration via Cup;Straw Medication Administration Whole meds with liquid Compensations Slow rate;Multiple dry swallows after each bite/sip Postural Changes Remain semi-upright after after feeds/meals (Comment);Seated upright at 90 degrees   CHL IP OTHER RECOMMENDATIONS 11/29/2016 Recommended Consults -- Oral Care Recommendations Oral care BID;Staff/trained caregiver to provide oral care Other Recommendations Clarify dietary restrictions   CHL IP FOLLOW UP RECOMMENDATIONS 11/29/2016 Follow up Recommendations Skilled Nursing facility   [facility] IP FREQUENCY AND DURATION 11/29/2016 Speech Therapy Frequency (ACUTE ONLY) min 2x/week Treatment Duration 1 week      CHL IP ORAL PHASE 11/29/2016 Oral Phase Impaired Oral - Pudding Teaspoon -- Oral - Pudding Cup -- Oral - Honey Teaspoon -- Oral - Honey Cup -- Oral - Nectar Teaspoon -- Oral - Nectar Cup Reduced posterior propulsion;Lingual/palatal residue;Delayed oral transit Oral - Nectar Straw -- Oral - Thin Teaspoon -- Oral - Thin Cup Reduced posterior propulsion;Lingual/palatal residue;Piecemeal swallowing;Delayed oral transit;Premature spillage Oral - Thin Straw -- Oral - Puree WFL;Delayed oral transit Oral - Mech Soft Impaired mastication;Reduced posterior propulsion;Piecemeal swallowing;Delayed oral transit Oral - Regular -- Oral - Multi-Consistency -- Oral - Pill -- Oral Phase - Comment --  CHL IP PHARYNGEAL PHASE 11/29/2016 Pharyngeal Phase Impaired Pharyngeal- Pudding Teaspoon -- Pharyngeal -- Pharyngeal- Pudding Cup -- Pharyngeal -- Pharyngeal- Honey Teaspoon -- Pharyngeal -- Pharyngeal- Honey Cup -- Pharyngeal -- Pharyngeal- Nectar Teaspoon -- Pharyngeal -- Pharyngeal- Nectar Cup Pharyngeal residue - valleculae;Pharyngeal  residue - pyriform;Delayed swallow initiation-pyriform sinuses Pharyngeal -- Pharyngeal- Nectar Straw -- Pharyngeal -- Pharyngeal- Thin Teaspoon Delayed swallow initiation-pyriform sinuses Pharyngeal -- Pharyngeal- Thin Cup Delayed swallow initiation-pyriform sinuses;Pharyngeal residue - valleculae;Pharyngeal residue - pyriform Pharyngeal -- Pharyngeal- Thin Straw Delayed swallow initiation-pyriform sinuses;Pharyngeal residue - valleculae;Pharyngeal residue - pyriform Pharyngeal -- Pharyngeal- Puree Delayed swallow initiation-vallecula Pharyngeal -- Pharyngeal- Mechanical Soft Delayed swallow initiation-vallecula;Pharyngeal residue - cp segment Pharyngeal -- Pharyngeal- Regular -- Pharyngeal -- Pharyngeal- Multi-consistency -- Pharyngeal -- Pharyngeal- Pill -- Pharyngeal -- Pharyngeal Comment pharynx clear after primary bolus swallow, but oral residuals (liquids) pool into valleculae and pyriforms after the swallow  CHL IP CERVICAL ESOPHAGEAL PHASE 11/29/2016 Cervical Esophageal Phase Impaired Pudding Teaspoon -- Pudding Cup -- Honey Teaspoon -- Honey Cup -- Nectar Teaspoon -- Nectar Cup -- Nectar Straw -- Thin Teaspoon -- Thin Cup -- Thin Straw -- Puree -- Mechanical Soft Esophageal backflow into cervical esophagus Regular -- Multi-consistency -- Pill -- Cervical Esophageal Comment mild retention/backflow into UES from cervical esophagus CHL IP GO 11/29/2016 Functional Assessment Tool Used clinical judgment Functional Limitations Swallowing Swallow Current Status (W0981) CJ Swallow Goal Status (X9147) CI Swallow Discharge Status (W2956) (None) Motor Speech Current Status (O1308) (None) Motor Speech Goal Status (M5784) (None) Motor Speech Goal Status (O9629) (None) Spoken Language Comprehension Current Status (B2841) (None) Spoken Language Comprehension Goal Status (L2440) (None) Spoken Language Comprehension Discharge Status (N0272) (None) Spoken Language Expression Current Status (Z3664) (None) Spoken Language  Expression Goal Status (Q0347) (None) Spoken Language Expression Discharge Status (Q2595) (None) Attention Current Status (G3875) (None) Attention Goal Status (I4332) (None) Attention Discharge Status (R5188) (None) Memory Current Status (C1660) (None) Memory Goal Status (Y3016) (None) Memory Discharge Status (  Z6109) (None) Voice Current Status [phone] (None) Voice Goal Status [phone]) (None) Voice Discharge Status [phone]) (None) Other Speech-Language Pathology Functional Limitation [phone]) (None) Other Speech-Language Pathology Functional Limitation Goal Status (Z3086) (None) Other Speech-Language Pathology Functional Limitation Discharge Status [phone]) (None) Thank you, Havery Moros, CCC-SLP [phone] PORTER,DABNEY 11/29/2016, 8:32 PM               (Echo, Carotid, EGD, Colonoscopy, ERCP)    Subjective: Pt without complaints.  Ate some oatmeal this morning.    Discharge Exam: Vitals:   12/05/16 2154 12/06/16 0505  BP: 117/81 122/67  Pulse: 90 79  Resp: 18 18  Temp: 97.8 F (36.6 C) 97.5 F (36.4 C)   Vitals:   12/05/16 1300 12/05/16 2154 12/06/16 0419 12/06/16 0505  BP: 106/68 117/81  122/67  Pulse: 91 90  79  Resp: 20 18  18   Temp: 98 F (36.7 C) 97.8 F (36.6 C)  97.5 F (36.4 C)  TempSrc: Oral Oral  Oral  SpO2: 95% 97%  98%  Weight:   110.2 kg (242 lb 15.2 oz)   Height:        General exam:  awake Respiratory system: BBS no increased WOB.  Cardiovascular system: irregular rate and rhythm, no murmurs, rubs or gallops auscultated Gastrointestinal system: Abdomen is nondistended, soft and nontender. No organomegaly or masses felt. Normal bowel sounds heard. Central nervous system: Moves all 4 spontaneously Extremities: Right greater than left upper extremity edema, BLE edema Skin: No rashes, lesions or ulcers  The results of significant diagnostics from this hospitalization (including imaging, microbiology, ancillary and laboratory) are listed below for reference.      Microbiology: Recent Results (from the past 240 hour(s))  Culture, blood (routine x 2)     Status: None   Collection Time: 11/28/16  7:57 PM  Result Value Ref Range Status   Specimen Description LEFT ANTECUBITAL  Final   Special Requests BOTTLES DRAWN AEROBIC AND ANAEROBIC 6CC  Final   Culture NO GROWTH 5 DAYS  Final   Report Status 12/03/2016 FINAL  Final  Culture, blood (routine x 2)     Status: None   Collection Time: 11/28/16  8:08 PM  Result Value Ref Range Status   Specimen Description BLOOD LEFT HAND  Final   Special Requests BOTTLES DRAWN AEROBIC ONLY 6CC  Final   Culture NO GROWTH 5 DAYS  Final   Report Status 12/03/2016 FINAL  Final  MRSA PCR Screening     Status: None   Collection Time: 11/28/16  8:41 PM  Result Value Ref Range Status   MRSA by PCR NEGATIVE NEGATIVE Final    Comment:        The GeneXpert MRSA Assay (FDA approved for NASAL specimens only), is one component of a comprehensive MRSA colonization surveillance program. It is not intended to diagnose MRSA infection nor to guide or monitor treatment for MRSA infections.      Labs: BNP (last 3 results)  Recent Labs  11/17/16 0056 11/27/16 0730 11/28/16 1617  BNP 202.0* 258.0* 240.0*   Basic Metabolic Panel:  Recent Labs Lab 11/30/16 0419 12/02/16 0446 12/06/16 0519  NA 133* 132* 134*  K 3.6 3.8 3.5  CL 92* 92* 90*  CO2 32 31 37*  GLUCOSE 111* 105* 110*  BUN 20 16 19   CREATININE 0.61 0.61 0.61  CALCIUM 7.7* 7.6* 7.8*   Liver Function Tests:  Recent Labs Lab 12/06/16 0519  AST 28  ALT 13*  ALKPHOS 52  BILITOT  0.7  PROT 5.7*  ALBUMIN 1.6*   No results for input(s): LIPASE, AMYLASE in the last 168 hours. No results for input(s): AMMONIA in the last 168 hours. CBC:  Recent Labs Lab 12/06/16 0519  WBC 13.5*  HGB 13.8  HCT 42.4  MCV 92.4  PLT 324   Cardiac Enzymes: No results for input(s): CKTOTAL, CKMB, CKMBINDEX, TROPONINI in the last 168 hours. BNP: Invalid  input(s): POCBNP CBG: No results for input(s): GLUCAP in the last 168 hours. D-Dimer No results for input(s): DDIMER in the last 72 hours. Hgb A1c No results for input(s): HGBA1C in the last 72 hours. Lipid Profile No results for input(s): CHOL, HDL, LDLCALC, TRIG, CHOLHDL, LDLDIRECT in the last 72 hours. Thyroid function studies No results for input(s): TSH, T4TOTAL, T3FREE, THYROIDAB in the last 72 hours.  Invalid input(s): FREET3 Anemia work up No results for input(s): VITAMINB12, FOLATE, FERRITIN, TIBC, IRON, RETICCTPCT in the last 72 hours. Urinalysis    Component Value Date/Time   COLORURINE YELLOW 11/29/2016 0400   APPEARANCEUR CLEAR 11/29/2016 0400   LABSPEC 1.010 11/29/2016 0400   PHURINE 5.5 11/29/2016 0400   GLUCOSEU NEGATIVE 11/29/2016 0400   HGBUR TRACE (A) 11/29/2016 0400   BILIRUBINUR NEGATIVE 11/29/2016 0400   KETONESUR NEGATIVE 11/29/2016 0400   PROTEINUR NEGATIVE 11/29/2016 0400   NITRITE NEGATIVE 11/29/2016 0400   LEUKOCYTESUR NEGATIVE 11/29/2016 0400   Sepsis Labs Invalid input(s): PROCALCITONIN,  WBC,  LACTICIDVEN Microbiology Recent Results (from the past 240 hour(s))  Culture, blood (routine x 2)     Status: None   Collection Time: 11/28/16  7:57 PM  Result Value Ref Range Status   Specimen Description LEFT ANTECUBITAL  Final   Special Requests BOTTLES DRAWN AEROBIC AND ANAEROBIC 6CC  Final   Culture NO GROWTH 5 DAYS  Final   Report Status 12/03/2016 FINAL  Final  Culture, blood (routine x 2)     Status: None   Collection Time: 11/28/16  8:08 PM  Result Value Ref Range Status   Specimen Description BLOOD LEFT HAND  Final   Special Requests BOTTLES DRAWN AEROBIC ONLY 6CC  Final   Culture NO GROWTH 5 DAYS  Final   Report Status 12/03/2016 FINAL  Final  MRSA PCR Screening     Status: None   Collection Time: 11/28/16  8:41 PM  Result Value Ref Range Status   MRSA by PCR NEGATIVE NEGATIVE Final    Comment:        The GeneXpert MRSA Assay  (FDA approved for NASAL specimens only), is one component of a comprehensive MRSA colonization surveillance program. It is not intended to diagnose MRSA infection nor to guide or monitor treatment for MRSA infections.    Time coordinating discharge: 34 minutes  SIGNED:  Standley Dakins, MD  [facility] 12/06/2016, 12:55 PM Pager   If 7PM-7AM, please contact night-coverage [email or web address] Password [email or web address]

## 2016-12-06 NOTE — Progress Notes (Signed)
Progress Note  Patient Name: Yvonne Chan Date of Encounter: 12/06/2016  Primary Cardiologist: Dr. Prentice DockerSuresh Koneswaran  Subjective   Weak, no chest pain or palpitations.  Inpatient Medications    Scheduled Meds: . acidophilus  1 capsule Oral Daily  . amiodarone  400 mg Oral BID  . amoxicillin-clavulanate  1 tablet Oral Q12H  . apixaban  5 mg Oral BID  . furosemide  40 mg Oral Daily  . guaiFENesin  600 mg Oral BID  . levothyroxine  50 mcg Oral QAC breakfast  . metoprolol tartrate  37.5 mg Oral TID  . omega-3 acid ethyl esters  1 g Oral Daily  . polyethylene glycol  17 g Oral Daily  . potassium chloride  10 mEq Oral Daily  . senna-docusate  1 tablet Oral BID  . sodium chloride  1 g Oral BID WC    PRN Meds: acetaminophen, bisacodyl, ipratropium-albuterol, ondansetron (ZOFRAN) IV   Vital Signs    Vitals:   12/05/16 1300 12/05/16 2154 12/06/16 0419 12/06/16 0505  BP: 106/68 117/81  122/67  Pulse: 91 90  79  Resp: 20 18  18   Temp: 98 F (36.7 C) 97.8 F (36.6 C)  97.5 F (36.4 C)  TempSrc: Oral Oral  Oral  SpO2: 95% 97%  98%  Weight:   242 lb 15.2 oz (110.2 kg)   Height:        Intake/Output Summary (Last 24 hours) at 12/06/16 0821 Last data filed at 12/06/16 0500  Gross per 24 hour  Intake              480 ml  Output             1226 ml  Net             -746 ml   Filed Weights   12/04/16 0500 12/05/16 0500 12/06/16 0419  Weight: 244 lb 4.3 oz (110.8 kg) 244 lb 4.3 oz (110.8 kg) 242 lb 15.2 oz (110.2 kg)    Telemetry    Telemetry shows atrial fibrillation, heart rate 100. Personally reviewed.  Physical Exam   GEN: Elderly obese woman. No acute distress.   Neck: No JVD. Cardiac:  Irregularly irregular, distant, no murmur.  Respiratory: Nonlabored. Decreased breath sounds at the bases. GI: Soft, nontender, bowel sounds present. MS: No edema; No deformity.  Labs    Chemistry  Recent Labs Lab 11/30/16 0419 12/02/16 0446 12/06/16 0519  NA 133*  132* 134*  K 3.6 3.8 3.5  CL 92* 92* 90*  CO2 32 31 37*  GLUCOSE 111* 105* 110*  BUN 20 16 19   CREATININE 0.61 0.61 0.61  CALCIUM 7.7* 7.6* 7.8*  PROT  --   --  5.7*  ALBUMIN  --   --  1.6*  AST  --   --  28  ALT  --   --  13*  ALKPHOS  --   --  52  BILITOT  --   --  0.7  GFRNONAA >60 >60 >60  GFRAA >60 >60 >60  ANIONGAP 9 9 7      Hematology  Recent Labs Lab 12/06/16 0519  WBC 13.5*  RBC 4.59  HGB 13.8  HCT 42.4  MCV 92.4  MCH 30.1  MCHC 32.5  RDW 13.8  PLT 324    Radiology    No results found.  Cardiac Studies   Echocardiogram 11/02/2016: Study Conclusions  - Left ventricle: The cavity size was normal. Wall thickness was   increased  in a pattern of moderate LVH. Systolic function was   normal. The estimated ejection fraction was in the range of 60%   to 65%. - Aortic valve: Mildly calcified annulus. Mildly thickened   leaflets. Valve area (VTI): 2.22 cm^2. Valve area (Vmax): 2.15   cm^2. - Mitral valve: Mildly calcified annulus. Mildly thickened leaflets   . There was mild regurgitation. - Left atrium: The atrium was moderately dilated. - Right ventricle: The cavity size was mildly dilated. - Right atrium: The atrium was moderately dilated. - Technically difficult study.  Patient Profile     83 y.o. female with history of HTN, atrial fibrillation on Eliquis, chronic diastolic CHF, and hypothyroidism who is a resident of the [facility], presented to ER with complaints of edema, dyspnea and racing heart rate. She has been recently hospitalized in January of 2018 for influenza and CAP, and again this month for influenza. Found to have atrial fibrillation with RVR. Medications are being adjusted for better heart rate control.  Assessment & Plan    1. Persistent atrial fibrillation with RVR. Heart rate control has improved, now on oral amiodarone load and metoprolol which is being further uptitrated - blood pressure stable. She also continues on Eliquis  for stroke prophylaxis with CHADSVASC score of 3.  2. Recent diagnoses including community-acquired pneumonia and influenza.  3. Deconditioning. PT working with patient as tolerated.  4. Acute on chronic diastolic heart failure, transitioned to oral Lasix. LVEF 60-65%. Continues to diurese slowly with stable renal function.  Continue amiodarone 400 mg twice daily, will change to 200 mg twice daily on Friday. Continue Eliquis for stroke prophylaxis. Lopressor will be increased to 50 mg TID. No change in current diuretic regimen.  Signed, Nona Dell, MD  12/06/2016, 8:21 AM

## 2016-12-06 NOTE — Care Management Note (Signed)
Case Management Note  Patient Details  Name: Romero BellingMary Lewan MRN: [id number] Date of Birth: 12-21-1928  Expected Discharge Date:  12/06/16               Expected Discharge Plan:  Skilled Nursing Facility  In-House Referral:  Clinical Social Work  Discharge planning Services  CM Consult Status of Service:  Completed, signed off  If discussed at [employer] Length of Stay Meetings, dates discussed: 12/06/2016   Additional Comments: [facility]. CSW making arrangements. CM making f/u appointment with cardiologist. Malcolm Metrohildress, Jalien Weakland Demske, RN 12/06/2016, 12:55 PM

## 2016-12-06 NOTE — Discharge Instructions (Signed)
Take amiodarone 200 mg BID for 1 week, then take 200 mg daily.   Take Augmentin for 3 more days then STOP Return if symptoms recur, worsen or new problems develop.

## 2016-12-06 NOTE — Care Management Important Message (Signed)
Important Message  Patient Details  Name: Yvonne BellingMary Chan MRN: [id number] Date of Birth: 02-23-1929   Medicare Important Message Given:  Yes    Malcolm MetroChildress, Bilan Tedesco Demske, RN 12/06/2016, 1:04 PM

## 2016-12-06 NOTE — Progress Notes (Signed)
Physical Therapy Treatment Patient Details Name: Yvonne Chan MRN: [id number] DOB: [birth year] Today's Date: 12/06/2016    History of Present Illness 83 y.o. female with medical history significant of HTN, PAF on Eliquis, diastolic CHF, and hypothyroidism; who presents from the [facility] for a fast irregular heartbeat noticed this afternoon by staff. According to family present at bedside she has had increasing swelling in her right arms and bilateral legs. Patient complains of feeling short of breath. Review of records shows that the patient has been hospitalized multiple times within the last month1/25-1/30, 1/31-2/9, and then 2/9-2/10. Treated for atrial fibrillation, diastolic CHF, influenza, and community-acquired pneumonia. Other associated symptoms include decreased appetite and generalized weakness. Patient denies any chest pain, dysuria, nausea, vomiting, or abdominal pain. Review of records shows the patient weight back on 2/6 was 224 pounds.   Dx: Diastolic CHF exacerbation/ pulmonary edema: Acute on chronic, and possible HCAP.     PT Comments    Pt received in bed, and is agreeable to PT tx.  Son present, but leaves for tx session.  Pt is unable to participate with LE exercises in the bed today.  She states that it is too painful.  She continues to require Max/Total A +2 person for supine<>sit, and then Min A/Min guard for static sitting balance due to posterior and right lean.  She required Max/Total A for sit<>supine and then Max A +2 for rolling each direction for hygiene.  Continue to recommend SNF due to increased need for assist with all levels of functional mobility.     Follow Up Recommendations  SNF     Equipment Recommendations  None recommended by PT    Recommendations for Other Services       Precautions / Restrictions Precautions Precautions: Fall Precaution Comments: Due to immobility and poor safety awareness during mobiltiy.  Restrictions Weight Bearing  Restrictions: No    Mobility  Bed Mobility Overal bed mobility: Needs Assistance Bed Mobility: Supine to Sit;Rolling Rolling: Max assist;+2 for physical assistance;+2 for safety/equipment   Supine to sit: Total assist;Max assist;+2 for physical assistance;+2 for safety/equipment;HOB elevated Sit to supine: Total assist;+2 for safety/equipment;+2 for physical assistance   General bed mobility comments: Pt was able to sit on the EOB for 5 min with constant Min A/Min guard.  Due to posterior and right lean.   Transfers                    Ambulation/Gait                 Stairs            Wheelchair Mobility    Modified Rankin (Stroke Patients Only)       Balance Overall balance assessment: Needs assistance Sitting-balance support: Bilateral upper extremity supported;Feet supported Sitting balance-Leahy Scale: Fair Sitting balance - Comments: Pt was able to improve static sitting posture today, and able to look up towards the doorway insteady of keeping head looking down at the floor.  PT assisted with anterior weight shift using B UE reach Postural control: Posterior lean;Right lateral lean                          Cognition Arousal/Alertness: Awake/alert Behavior During Therapy: Flat affect Overall Cognitive Status: Difficult to assess                 General Comments: Pt continues to answer "uh-huh" to almost all questions, but then does  not follow up with action that was requested.  She was able to lift head up to look forward on several occasions today while sitting on the EOB.     Exercises General Exercises - Upper Extremity Shoulder Flexion: PROM;5 reps;Right (limited due to pain) General Exercises - Lower Extremity Heel Slides: PROM;Both;Other (comment) (3-5 reps - pt not participating in LE exercises.  She states that it is painful.  )    General Comments        Pertinent Vitals/Pain      Home Living                       Prior Function            PT Goals (current goals can now be found in the care plan section) Acute Rehab PT Goals Patient Stated Goal: None stated PT Goal Formulation: With patient Time For Goal Achievement: 12/13/16 Potential to Achieve Goals: Poor Progress towards PT goals: Not progressing toward goals - comment    Frequency    Min 3X/week      PT Plan Current plan remains appropriate    Co-evaluation             End of Session   Activity Tolerance: Patient limited by lethargy;Patient limited by fatigue Patient left: in bed;with call bell/phone within reach Nurse Communication: Mobility status PT Visit Diagnosis: Muscle weakness (generalized) (M62.81);Other abnormalities of gait and mobility (R26.89)     Time: [id number] PT Time Calculation (min) (ACUTE ONLY): 25 min  Charges:  $Therapeutic Activity: 23-37 mins                    G Codes:       Beth Margherita Collyer, PT, DPT X: [phone]

## 2016-12-06 NOTE — Clinical Social Work Note (Signed)
Patient and son are aware of patient's discharge today.  L[facility].   LCSW signing off.     Bayle Calvo, Juleen ChinaHeather D, LCSW

## 2016-12-07 ENCOUNTER — Emergency Department (HOSPITAL_COMMUNITY)
Admission: EM | Admit: 2016-12-07 | Discharge: 2016-12-07 | Disposition: A | Discharge status: Home / Self Care | Payer: Medicare HMO | Attending: Emergency Medicine | Admitting: Emergency Medicine

## 2016-12-07 ENCOUNTER — Encounter: Payer: Self-pay | Admitting: Internal Medicine

## 2016-12-07 ENCOUNTER — Non-Acute Institutional Stay (SKILLED_NURSING_FACILITY): Payer: Medicare HMO | Admitting: Internal Medicine

## 2016-12-07 ENCOUNTER — Encounter (HOSPITAL_COMMUNITY): Payer: Self-pay | Admitting: Emergency Medicine

## 2016-12-07 ENCOUNTER — Emergency Department (HOSPITAL_COMMUNITY): Payer: Medicare HMO

## 2016-12-07 DIAGNOSIS — E039 Hypothyroidism, unspecified: Secondary | ICD-10-CM | POA: Insufficient documentation

## 2016-12-07 DIAGNOSIS — I110 Hypertensive heart disease with heart failure: Secondary | ICD-10-CM | POA: Diagnosis not present

## 2016-12-07 DIAGNOSIS — Z79899 Other long term (current) drug therapy: Secondary | ICD-10-CM | POA: Diagnosis not present

## 2016-12-07 DIAGNOSIS — I5031 Acute diastolic (congestive) heart failure: Secondary | ICD-10-CM | POA: Insufficient documentation

## 2016-12-07 DIAGNOSIS — R4182 Altered mental status, unspecified: Secondary | ICD-10-CM | POA: Diagnosis not present

## 2016-12-07 LAB — CBC WITH DIFFERENTIAL/PLATELET
Basophils Absolute: 0 10*3/uL (ref 0.0–0.1)
Basophils Relative: 0 %
Eosinophils Absolute: 0.1 10*3/uL (ref 0.0–0.7)
Eosinophils Relative: 1 %
HEMATOCRIT: 41.8 % (ref 36.0–46.0)
HEMOGLOBIN: 13.5 g/dL (ref 12.0–15.0)
LYMPHS ABS: 1.4 10*3/uL (ref 0.7–4.0)
LYMPHS PCT: 11 %
MCH: 29.6 pg (ref 26.0–34.0)
MCHC: 32.3 g/dL (ref 30.0–36.0)
MCV: 91.7 fL (ref 78.0–100.0)
MONOS PCT: 5 %
Monocytes Absolute: 0.6 10*3/uL (ref 0.1–1.0)
NEUTROS PCT: 83 %
Neutro Abs: 11 10*3/uL — ABNORMAL HIGH (ref 1.7–7.7)
Platelets: 361 10*3/uL (ref 150–400)
RBC: 4.56 MIL/uL (ref 3.87–5.11)
RDW: 14.1 % (ref 11.5–15.5)
WBC: 13.1 10*3/uL — AB (ref 4.0–10.5)

## 2016-12-07 LAB — COMPREHENSIVE METABOLIC PANEL
ALK PHOS: 51 U/L (ref 38–126)
ALT: 13 U/L — AB (ref 14–54)
AST: 26 U/L (ref 15–41)
Albumin: 1.6 g/dL — ABNORMAL LOW (ref 3.5–5.0)
Anion gap: 6 (ref 5–15)
BILIRUBIN TOTAL: 0.8 mg/dL (ref 0.3–1.2)
BUN: 19 mg/dL (ref 6–20)
CALCIUM: 7.7 mg/dL — AB (ref 8.9–10.3)
CHLORIDE: 91 mmol/L — AB (ref 101–111)
CO2: 36 mmol/L — ABNORMAL HIGH (ref 22–32)
CREATININE: 0.55 mg/dL (ref 0.44–1.00)
GFR calc Af Amer: >60 mL/min (ref >60)
Glucose, Bld: 126 mg/dL — ABNORMAL HIGH (ref 65–99)
Potassium: 3.8 mmol/L (ref 3.5–5.1)
Sodium: 133 mmol/L — ABNORMAL LOW (ref 135–145)
TOTAL PROTEIN: 6 g/dL — AB (ref 6.5–8.1)

## 2016-12-07 LAB — URINALYSIS, ROUTINE W REFLEX MICROSCOPIC
Bilirubin Urine: NEGATIVE
Glucose, UA: NEGATIVE mg/dL
Ketones, ur: NEGATIVE mg/dL
Leukocytes, UA: NEGATIVE
Nitrite: NEGATIVE
PH: 6 (ref 5.0–8.0)
Protein, ur: NEGATIVE mg/dL
SPECIFIC GRAVITY, URINE: 1.011 (ref 1.005–1.030)

## 2016-12-07 LAB — PROTIME-INR
INR: 2.06
PROTHROMBIN TIME: 23.5 s — AB (ref 11.4–15.2)

## 2016-12-07 LAB — I-STAT CG4 LACTIC ACID, ED: LACTIC ACID, VENOUS: 1.84 mmol/L (ref 0.5–1.9)

## 2016-12-07 LAB — AMMONIA: Ammonia: 21 umol/L (ref 9–35)

## 2016-12-07 LAB — CBG MONITORING, ED: Glucose-Capillary: 115 mg/dL — ABNORMAL HIGH (ref 65–99)

## 2016-12-07 LAB — TROPONIN I: Troponin I: <0.03 ng/mL (ref <0.03)

## 2016-12-07 MED ORDER — SODIUM CHLORIDE 0.9 % IV SOLN
INTRAVENOUS | Status: DC
  Administered 2016-12-07: 12:00:00 via INTRAVENOUS

## 2016-12-07 NOTE — ED Notes (Signed)
Report to [place], RN at the [facility]- they will come and pick up

## 2016-12-07 NOTE — ED Triage Notes (Signed)
[facility] for evaluation of altered mental status.  Daughter at bedside states pt normally talkative.  Pt has eyes open, but does not respond.

## 2016-12-07 NOTE — ED Notes (Signed)
Per daughter, pt well last pm and conversant. She is now with pinpoint pupils and obtunded

## 2016-12-07 NOTE — Progress Notes (Signed)
Location:   [facility] Room Number: 131/P Place of Service:  SNF (31) Provider:  Adah PerlArlo Mckena Chern  BAUCOM, JENNY B, PA-C  Patient Care Team: Phyllis GingerJenny B Baucom, PA-C as PCP - General (Physician Assistant)  Extended Emergency Contact Information Primary Emergency Contact: Pilar GrammesGraves,[place]  [place] Home Phone: [phone] Mobile Phone: [phone] Relation: Daughter Secondary Emergency Contact: Fransisco HertzWalker,[place]  [place] Home Phone: [phone] Relation: Son  Code Status: Full Code  Goals of care: Advanced Directive information Advanced Directives 12/07/2016  Does Patient Have a Medical Advance Directive? Yes  Type of Advance Directive Out of facility DNR (pink MOST or yellow form)  Does patient want to make changes to medical advance directive? No - Patient declined  Would patient like information on creating a medical advance directive? No - Patient declined     Chief Complaint  Patient presents with  . Acute Visit    Altered mental status    HPI:  Pt is a 83 y.o. female seen today for an acute visit for Relative unresponsiveness altered mental status.  She was actually just readmitted to facility yesterday after a hospitalization it appears for congestive heart failure where she was diuresed.  She has a very complicated medical history-year-old the last several weeks.  She's been hospitalize several times within the past month-[place] treated for atrial fibrillation-rehospitalized for influenza-she is also has a significant history of diastolic CHF which was treated during her hospitalizations as well as community-acquired pneumonia  During her most recent hospitalization she presented with rapid ventricular response atrial fibrillation-her Lopressor was increased she was continued on amiodarone.  There also was thought to be an element of aspiration pneumonia recommendation was for dysphagia 2 diet.  She was given Unasyn  to cover aspiration pathogens and discharged on Augmentin.  Regards to acute on chronic diastolic CHF but appears she was diuresed.  Apparently she was responsive and alert when she arrived in facility yesterday-however this morning she's been lethargic and minimally responsive vital signs are remarkable for Lopressor pressure of 90/56-she is afebrile.  CBG is in the mid 100s.  Pulse apically appears to be in the 80s.     Past Medical History:  Diagnosis Date  . Atrial fibrillation (HCC)   . Essential hypertension   . Hypothyroidism   . Lumbago   . Neuralgia   . Neuritis    Past Surgical History:  Procedure Laterality Date  . ABDOMINAL HYSTERECTOMY    . APPENDECTOMY      No Known Allergies  Allergies as of 12/07/2016   No Known Allergies     Medication List       Accurate as of 12/07/16  3:15 PM. Always use your most recent med list.          acetaminophen 325 MG tablet Commonly known as:  TYLENOL Take 650 mg by mouth every 6 (six) hours as needed.   amiodarone 200 MG tablet Commonly known as:  PACERONE Take 1 po BID x 1 week, then 1 po daily   amoxicillin-clavulanate 875-125 MG tablet Commonly known as:  AUGMENTIN Take 1 tablet by mouth every 12 (twelve) hours.   apixaban 5 MG Tabs tablet Commonly known as:  ELIQUIS Take 1 tablet (5 mg total) by mouth 2 (two) times daily.   Fish Oil 1000 MG Caps Take 1 capsule by mouth daily.   furosemide 40 MG tablet Commonly known as:  LASIX Take 1 tablet (40 mg total) by mouth daily.  ipratropium-albuterol 0.5-2.5 (3) MG/3ML Soln Commonly known as:  DUONEB Take 3 mLs by nebulization every 6 (six) hours as needed (shortness of breath).   levothyroxine 50 MCG tablet Commonly known as:  SYNTHROID, LEVOTHROID Take 50 mcg by mouth daily before breakfast.   metoprolol tartrate 25 MG tablet Commonly known as:  LOPRESSOR Take 1 tablet (25 mg total) by mouth 3 (three) times daily.   OXYGEN Inhale 2 L into the  lungs daily as needed. To keep sats greater or equal to 90%   polyethylene glycol packet Commonly known as:  MIRALAX / GLYCOLAX Take 17 g by mouth daily as needed for moderate constipation.   potassium chloride 10 MEQ tablet Commonly known as:  K-DUR Take 10 mEq by mouth daily.   RISA-BID PROBIOTIC Tabs Take 1 tablet by mouth twice a day   sodium chloride 1 g tablet Take 1 tablet (1 g total) by mouth 2 (two) times daily with a meal.   VENELEX Oint Apply 1 application topically 3 (three) times daily. Apply to sacrum and bilateral buttocks every shift and as needed.       Review of Systems   Unattainable secondary to patient's relatively unresponsive state   There is no immunization history on file for this patient. Pertinent  Health Maintenance Due  Topic Date Due  . DEXA SCAN  12/29/1995  . INFLUENZA VACCINE  09/07/2017 (Originally 05/08/2016)  . PNA vac Low Risk Adult (1 of 2 - PCV13) 09/07/2017 (Originally 12/29/1995)   No flowsheet data found. Functional Status Survey:    Vitals:   12/07/16 1505  BP: (!) 90/56  Pulse: (!) 104  Resp: 18  Temp: 97.1 F (36.2 C)  TempSrc: Oral  SpO2: 97%   Pulse was auscultated at 86 Physical Exam   In general this is a somewhat obese elderly female she is minimally responsive will open her eyes to tactile stimulation is not really speaking.  Her skin is warm and dry.  Eyes pupils appear to be reactive.  Oropharynx-she would not really open her mouth.  Chest poor respiratory effort but I could not really appreciate overt   congestion or labored breathing.  Heart is irregular irregular rate and rhythm she has 2+ lower extremity edema-has significant edema as well her right upper extremity.  Milder edema of her left upper extremity.  Abdomen is obese soft does not appear to be acutely tender positive bowel sounds.  Musculoskeletal difficult exam since patient does not really respond to verbal commands.  Neurologic is  difficult to fully assess although I do not really see lateralizing findings  difficult exam since patient has noted responsiveness.    Labs reviewed:  Recent Labs  11/13/16 0550 11/14/16 0459 11/15/16 0437 11/16/16 0556  11/29/16 0426  12/02/16 0446 12/06/16 0519 12/07/16 1128  NA 123* 126* 129* 129*  < > 133*  < > 132* 134* 133*  K 3.5 3.9 4.2 4.2  < > 3.6  < > 3.8 3.5 3.8  CL 78* 82* 88* 88*  < > 92*  < > 92* 90* 91*  CO2 35* 30 32 35*  < > 32  < > 31 37* 36*  GLUCOSE 131* 115* 103* 126*  < > 101*  < > 105* 110* 126*  BUN 13 12 12 13   < > 22*  < > 16 19 19   CREATININE 0.56 0.56 0.66 0.63  < > 0.67  < > 0.61 0.61 0.55  CALCIUM 7.8* 8.0* 7.8* 8.2*  < >  7.9*  < > 7.6* 7.8* 7.7*  MG 1.3*  --  1.8  --   --  1.6*  --   --   --   --   PHOS  --  2.6  --  3.4  --   --   --   --   --   --   < > = values in this interval not displayed.  Recent Labs  11/28/16 1617 12/06/16 0519 12/07/16 1128  AST 32 28 26  ALT 21 13* 13*  ALKPHOS 55 52 51  BILITOT 0.7 0.7 0.8  PROT 6.7 5.7* 6.0*  ALBUMIN 2.1* 1.6* 1.6*    Recent Labs  11/27/16 0730 11/28/16 1617 11/29/16 0426 12/06/16 0519 12/07/16 1128  WBC 10.1 10.8* 11.8* 13.5* 13.1*  NEUTROABS 8.3* 9.0*  --   --  11.0*  HGB 12.6 12.9 12.3 13.8 13.5  HCT 38.6 40.5 37.3 42.4 41.8  MCV 92.6 94.0 92.3 92.4 91.7  PLT 328 375 345 324 361   Lab Results  Component Value Date   TSH 1.457 11/24/2016   No results found for: HGBA1C No results found for: CHOL, HDL, LDLCALC, LDLDIRECT, TRIG, CHOLHDL  Significant Diagnostic Results in last 30 days:  Dg Chest 2 View  Result Date: 11/17/2016 CLINICAL DATA:  Recent positive test for flu, shortness of breath EXAM: CHEST  2 VIEW COMPARISON:  11/10/2016 FINDINGS: Stable cardiomegaly and tiny effusions. No interval consolidation. No pneumothorax. Mild linear atelectasis in the left upper lobe. IMPRESSION: Cardiomegaly with tiny effusions. No interval development of acute focal pulmonary  infiltrate Electronically Signed   By: Jasmine Pang M.D.   On: 11/17/2016 01:39   Dg Chest 2 View  Result Date: 11/10/2016 CLINICAL DATA:  Heart failure.  Shortness of breath. EXAM: CHEST  2 VIEW COMPARISON:  November 07, 2014 FINDINGS: Cardiomegaly with mild pulmonary venous congestion. Probable tiny effusions. No significant interval change. IMPRESSION: Cardiomegaly with mild pulmonary venous congestion and tiny effusions. Electronically Signed   By: Gerome Sam III M.D   On: 11/10/2016 12:47   Dg Chest 2 View  Result Date: 11/07/2016 CLINICAL DATA:  Short of breath and chest pain EXAM: CHEST  2 VIEW COMPARISON:  11/01/2016 FINDINGS: Cardiac enlargement with mild vascular congestion. Negative for edema or effusion. Mild left lower lobe atelectasis. Thoracic degenerative changes. IMPRESSION: Cardiac enlargement with mild vascular congestion. Mild left lower lobe atelectasis Electronically Signed   By: Marlan Palau M.D.   On: 11/07/2016 15:31   Ct Head Wo Contrast  Result Date: 12/07/2016 CLINICAL DATA:  Acute presentation with altered mental status. EXAM: CT HEAD WITHOUT CONTRAST TECHNIQUE: Contiguous axial images were obtained from the base of the skull through the vertex without intravenous contrast. COMPARISON:  None. FINDINGS: Brain: Mild generalized age related atrophy. No focal brainstem or cerebellar finding. Old small vessel infarctions in the thalami I, basal ganglia and cerebral hemispheric white matter. No large vessel territory infarction. No sign of acute insult. No mass lesion, hemorrhage, hydrocephalus or extra-axial collection. Vascular: There is atherosclerotic calcification of the major vessels at the base of the brain. Skull: Negative Sinuses/Orbits: Clear/normal Other: None IMPRESSION: Age related atrophy. Mild chronic small-vessel ischemic changes as outlined above. No acute or reversible finding. Electronically Signed   By: Paulina Fusi M.D.   On: 12/07/2016 13:52   US Venous  Img Upper Uni Right  Result Date: 11/29/2016 CLINICAL DATA:  Right arm swelling EXAM: Right UPPER EXTREMITY VENOUS DOPPLER ULTRASOUND TECHNIQUE: Gray-scale sonography with  graded compression, as well as color Doppler and duplex ultrasound were performed to evaluate the upper extremity deep venous system from the level of the subclavian vein and including the jugular, axillary, basilic, radial, ulnar and upper cephalic vein. Spectral Doppler was utilized to evaluate flow at rest and with distal augmentation maneuvers. COMPARISON:  None. FINDINGS: Contralateral Subclavian Vein: Respiratory phasicity is normal and symmetric with the symptomatic side. No evidence of thrombus. Normal compressibility. Internal Jugular Vein: No evidence of thrombus. Normal compressibility, respiratory phasicity and response to augmentation. Subclavian Vein: No evidence of thrombus. Normal compressibility, respiratory phasicity and response to augmentation. Axillary Vein: No evidence of thrombus. Normal compressibility, respiratory phasicity and response to augmentation. Cephalic Vein: No evidence of thrombus. Normal compressibility, respiratory phasicity and response to augmentation. Basilic Vein: No evidence of thrombus. Normal compressibility, respiratory phasicity and response to augmentation. Brachial Veins: No evidence of thrombus. Normal compressibility, respiratory phasicity and response to augmentation. Radial Veins: No evidence of thrombus. Normal compressibility, respiratory phasicity and response to augmentation. Ulnar Veins: No evidence of thrombus. Normal compressibility, respiratory phasicity and response to augmentation. Venous Reflux:  None visualized. Other Findings:  None visualized. IMPRESSION: No evidence of deep venous thrombosis. Electronically Signed   By: Marlan Palau M.D.   On: 11/29/2016 11:58   Dg Chest Port 1 View  Result Date: 11/28/2016 CLINICAL DATA:  Acute onset of shortness of breath and  tachycardia. Right-sided soft tissue edema. Initial encounter. EXAM: PORTABLE CHEST 1 VIEW COMPARISON:  Chest radiograph performed 11/17/2016 FINDINGS: The lungs are well-aerated. Left basilar opacity may reflect a combination of airspace opacification and small left pleural effusion. Underlying vascular congestion is noted. No pneumothorax is seen. Right paratracheal prominence is again noted. If not previously assessed, CT of the chest could be considered for further evaluation on an elective nonemergent basis. The heart is mildly enlarged. No acute osseous abnormalities are seen. IMPRESSION: 1. Left basilar opacity may reflect a combination of airspace opacification and small left pleural effusion. Pneumonia or possibly mild asymmetric interstitial edema could have such an appearance. 2. Underlying vascular congestion and mild cardiomegaly. 3. Right paratracheal prominence again noted. If not previously assessed, CT of the chest could be considered for further evaluation on an elective nonemergent basis. Electronically Signed   By: Roanna Raider M.D.   On: 11/28/2016 16:35   Dg Swallowing Func-speech Pathology  Result Date: 11/29/2016 Objective Swallowing Evaluation: Type of Study: MBS-Modified Barium Swallow Study Patient Details Name: Rabecca Birge MRN: [id number] Date of Birth: [birth year] Today's Date: 11/29/2016 Time: SLP Start Time (ACUTE ONLY): 1337-SLP Stop Time (ACUTE ONLY): 1405 SLP Time Calculation (min) (ACUTE ONLY): 28 min Past Medical History: Past Medical History: Diagnosis Date . Atrial fibrillation (HCC)  . Essential hypertension  . Hypothyroidism  . Lumbago  . Neuralgia  . Neuritis  Past Surgical History: Past Surgical History: Procedure Laterality Date . ABDOMINAL HYSTERECTOMY   . APPENDECTOMY   HPI: Taraneh Metheney a 83 y.o.femalewith medical history significant ofHTN, PAF on Eliquis, diastolic CHF, and hypothyroidism; who presents from the [facility] for a fastirregular heartbeat noticed  this afternoon by staff. According to family present at bedside she has had increasing swelling in her right arms and bilateral legs. Patient complains of feeling short of breath. Review of records shows that the patient has beenhospitalized multiple times within the last month1/25-1/30, 1/31-2/9, and then 2/9-2/10. Treated for atrial fibrillation, diastolic CHF, influenza, and community-acquired pneumonia. Other associated symptoms include decreased appetite and generalized weakness. Patient denies any  chest pain, dysuria, nausea, vomiting, orabdominal pain. Review of records shows the patient weight back on 2/6was 224 pounds. Upon admission into the emergency department patient was seen to be afebrile, pulse 120-140s, respirations 20-34, blood pressure 96/71-131/80, and O2 saturations maintained at room air. Lab work revealed WBC 10.8, sodium 133, chloride 91, CO2 33, BUN 23, creatinine 0.71, calcium 8.1, glucose 135, troponin 0.03, andBNP 240. Due to the patient's elevated heart rates she was placed on a diltiazem drip. TRH called to admit. Recommended empiric antibiotics of vancomycin and Zosyn for possibility of a healthcare associated pneumonia. SLP ordered for BSE due to concerns about aspiration risk. Subjective: "Okay." Assessment / Plan / Recommendation CHL IP CLINICAL IMPRESSIONS 11/29/2016 Clinical Impression Pt presents with mild/mod oral phase, mild pharyngeal phase, and suspected primary esophageal phase dysphagia. Oral phase is marked by reduced lingual manipulation of bolus resulting in decreased bolus cohesiveness with decreased anterior posterior oral transit, premature spillage over base of tongue with liquids, piecemeal deglutition with liquid oral residues after initial/primary swallow. Swallow initiation is triggered after spilling/filling the pyriforms with cup/straw sips of thin and cup sips of NTL. Pt demonstrates adequate hyolaryngeal excursion, epiglottic deflection, and airway  protection with no penetration or aspiration observed over course of study. Pt does have oral residuals after initial bolus/swallow of liquids which trickle to valleculae and pyriforms after the primary swallow which pt does not immediately sense, however these were never penetrated or aspirated when SLP observed without cuing her to swallow to clear. She does eventually spontaneously clear, however these residuals do pose a risk for penetration/aspiration. These residuals in pharynx can be cleared with caregiver cue to swallow two times for each sip of liquid. Esophageal sweep revealed stasis of barium/bolus in distal third of esophagus likely due to dysmotility. The barium tablet traversed esophagus and through LES without significant delay. Occasionally, backflow of bolus noted to move from cervical esophagus to UES, but was not noted to reflux back into pyriforms. Pt's greatest risks for aspiration appear related to liquid oral residuals pooling in pharynx after the primary swallow and also stasis in esophagus (threat of laryngopharyngeal reflux), however not observed today. Risks can be minimized by consuming soft textures (ok for mech soft however pt seems to prefer puree clinically) and thin liquids in upright position and remaining upright at least 30 minutes after meals with caregiver cues to swallow twice for each sip (whether cup or straw). Pt was assessed with nectar-thick liquids and found not to be better tolerated than thins. Results of MBSS reviewed with Pt's daughter. SLP Visit Diagnosis Dysphagia, oropharyngeal phase (R13.12);Dysphagia, pharyngeal phase (R13.13) Attention and concentration deficit following -- Frontal lobe and executive function deficit following -- Impact on safety and function Mild aspiration risk   CHL IP TREATMENT RECOMMENDATION 11/29/2016 Treatment Recommendations Therapy as outlined in treatment plan below   Prognosis 11/29/2016 Prognosis for Safe Diet Advancement Fair Barriers  to Reach Goals Behavior Barriers/Prognosis Comment -- CHL IP DIET RECOMMENDATION 11/29/2016 SLP Diet Recommendations Dysphagia 2 (Fine chop) solids;Thin liquid Liquid Administration via Cup;Straw Medication Administration Whole meds with liquid Compensations Slow rate;Multiple dry swallows after each bite/sip Postural Changes Remain semi-upright after after feeds/meals (Comment);Seated upright at 90 degrees   CHL IP OTHER RECOMMENDATIONS 11/29/2016 Recommended Consults -- Oral Care Recommendations Oral care BID;Staff/trained caregiver to provide oral care Other Recommendations Clarify dietary restrictions   CHL IP FOLLOW UP RECOMMENDATIONS 11/29/2016 Follow up Recommendations Skilled Nursing facility   [facility] IP FREQUENCY AND DURATION 11/29/2016 Speech Therapy  Frequency (ACUTE ONLY) min 2x/week Treatment Duration 1 week      CHL IP ORAL PHASE 11/29/2016 Oral Phase Impaired Oral - Pudding Teaspoon -- Oral - Pudding Cup -- Oral - Honey Teaspoon -- Oral - Honey Cup -- Oral - Nectar Teaspoon -- Oral - Nectar Cup Reduced posterior propulsion;Lingual/palatal residue;Delayed oral transit Oral - Nectar Straw -- Oral - Thin Teaspoon -- Oral - Thin Cup Reduced posterior propulsion;Lingual/palatal residue;Piecemeal swallowing;Delayed oral transit;Premature spillage Oral - Thin Straw -- Oral - Puree WFL;Delayed oral transit Oral - Mech Soft Impaired mastication;Reduced posterior propulsion;Piecemeal swallowing;Delayed oral transit Oral - Regular -- Oral - Multi-Consistency -- Oral - Pill -- Oral Phase - Comment --  CHL IP PHARYNGEAL PHASE 11/29/2016 Pharyngeal Phase Impaired Pharyngeal- Pudding Teaspoon -- Pharyngeal -- Pharyngeal- Pudding Cup -- Pharyngeal -- Pharyngeal- Honey Teaspoon -- Pharyngeal -- Pharyngeal- Honey Cup -- Pharyngeal -- Pharyngeal- Nectar Teaspoon -- Pharyngeal -- Pharyngeal- Nectar Cup Pharyngeal residue - valleculae;Pharyngeal residue - pyriform;Delayed swallow initiation-pyriform sinuses Pharyngeal --  Pharyngeal- Nectar Straw -- Pharyngeal -- Pharyngeal- Thin Teaspoon Delayed swallow initiation-pyriform sinuses Pharyngeal -- Pharyngeal- Thin Cup Delayed swallow initiation-pyriform sinuses;Pharyngeal residue - valleculae;Pharyngeal residue - pyriform Pharyngeal -- Pharyngeal- Thin Straw Delayed swallow initiation-pyriform sinuses;Pharyngeal residue - valleculae;Pharyngeal residue - pyriform Pharyngeal -- Pharyngeal- Puree Delayed swallow initiation-vallecula Pharyngeal -- Pharyngeal- Mechanical Soft Delayed swallow initiation-vallecula;Pharyngeal residue - cp segment Pharyngeal -- Pharyngeal- Regular -- Pharyngeal -- Pharyngeal- Multi-consistency -- Pharyngeal -- Pharyngeal- Pill -- Pharyngeal -- Pharyngeal Comment pharynx clear after primary bolus swallow, but oral residuals (liquids) pool into valleculae and pyriforms after the swallow  CHL IP CERVICAL ESOPHAGEAL PHASE 11/29/2016 Cervical Esophageal Phase Impaired Pudding Teaspoon -- Pudding Cup -- Honey Teaspoon -- Honey Cup -- Nectar Teaspoon -- Nectar Cup -- Nectar Straw -- Thin Teaspoon -- Thin Cup -- Thin Straw -- Puree -- Mechanical Soft Esophageal backflow into cervical esophagus Regular -- Multi-consistency -- Pill -- Cervical Esophageal Comment mild retention/backflow into UES from cervical esophagus CHL IP GO 11/29/2016 Functional Assessment Tool Used clinical judgment Functional Limitations Swallowing Swallow Current Status (W1191) CJ Swallow Goal Status (Y7829) CI Swallow Discharge Status (F6213) (None) Motor Speech Current Status (Y8657) (None) Motor Speech Goal Status (Q4696) (None) Motor Speech Goal Status (E9528) (None) Spoken Language Comprehension Current Status (U1324) (None) Spoken Language Comprehension Goal Status (M0102) (None) Spoken Language Comprehension Discharge Status (V2536) (None) Spoken Language Expression Current Status (U4403) (None) Spoken Language Expression Goal Status (K7425) (None) Spoken Language Expression Discharge Status  [phone]) (None) Attention Current Status (V5643) (None) Attention Goal Status (P2951) (None) Attention Discharge Status (O8416) (None) Memory Current Status (S0630) (None) Memory Goal Status (Z6010) (None) Memory Discharge Status (X3235) (None) Voice Current Status (T7322) (None) Voice Goal Status (G2542) (None) Voice Discharge Status (H0623) (None) Other Speech-Language Pathology Functional Limitation (J6283) (None) Other Speech-Language Pathology Functional Limitation Goal Status (T5176) (None) Other Speech-Language Pathology Functional Limitation Discharge Status [phone]) (None) Thank you, Havery Moros, CCC-SLP [phone] PORTER,DABNEY 11/29/2016, 8:32 PM               Assessment/Plan  #1 unresponsiveness-patient is minimally responsive also has a low blood pressure-this apparently is a significant change from her presentation yesterday-will send her to the ER for evaluation-  [phone]

## 2016-12-07 NOTE — Progress Notes (Signed)
This encounter was created in error - please disregard.

## 2016-12-07 NOTE — ED Notes (Signed)
Dr Miller in to reassess 

## 2016-12-07 NOTE — Discharge Instructions (Signed)
Your testing today has shown that you have some blood in your urine, this needs to be followed up within the next 2 weeks at your doctor's office. It has also shown that you have a slightly low sodium, slightly low calcium, all of these things could potentially make you slightly sleepy. I do want to follow up very closely within the next couple of days for a recheck with your family doctor but come back to the emergency department should you develop severe or worsening symptoms.

## 2016-12-07 NOTE — ED Provider Notes (Signed)
[facility] DEPT Provider Note   CSN: [id number] Arrival date & time: 12/07/16  1110     History   Chief Complaint Chief Complaint  Patient presents with  . Altered Mental Status    HPI Yvonne Chan is a 83 y.o. female.  HPI  The patient is an 83 year old female with multiple medical problems including a history of paroxysmal atrial fibrillation, hypertension, hypothyroidism as well as chronic swelling of her legs and a recent admission to the hospital approximately 3 times over the last month and a half for congestive heart failure, atrial fibrillation, influenza and community-acquired pneumonia. She was last discharged yesterday after a one-week stay for atrial fibrillation with rapid ventricular rate and peripheral edema. Staff at the [facility] where the patient was staying have decided to send the patient back to the hospital today because of altered mental status. The daughter who reports at the bedside also agrees that this is not the patient's normal mental status. Last night when the daughter was at the nursing facility the patient was speaking to her and her normal ability, she has no dementia. This morning the patient has been unable to speak.  Past Medical History:  Diagnosis Date  . Atrial fibrillation (HCC)   . Essential hypertension   . Hypothyroidism   . Lumbago   . Neuralgia   . Neuritis     Patient Active Problem List   Diagnosis Date Noted  . A-fib (HCC) 11/28/2016  . Swelling of right upper extremity 11/28/2016  . Influenza 11/17/2016  . Hyponatremia 11/07/2016  . Hyperglycemia 11/07/2016  . Weakness 11/07/2016  . Atrial fibrillation with RVR (HCC) 11/01/2016  . HTN (hypertension) 11/01/2016  . Hypothyroidism 11/01/2016  . Acute diastolic CHF (congestive heart failure) (HCC) 11/01/2016    Past Surgical History:  Procedure Laterality Date  . ABDOMINAL HYSTERECTOMY    . APPENDECTOMY      OB History    Gravida Para Term Preterm AB Living   10         5   SAB TAB Ectopic Multiple Live Births                   Home Medications    Prior to Admission medications   Medication Sig Start Date End Date Taking? Authorizing Provider  acetaminophen (TYLENOL) 325 MG tablet Take 650 mg by mouth every 6 (six) hours as needed.   Yes Historical Provider, MD  amiodarone (PACERONE) 200 MG tablet Take 1 po BID x 1 week, then 1 po daily Patient taking differently: Take 200 mg by mouth 2 (two) times daily. Take 1 po BID x 1 week, then 1 po daily 12/06/16  Yes Clanford Cyndie Mull, MD  amoxicillin-clavulanate (AUGMENTIN) 875-125 MG tablet Take 1 tablet by mouth every 12 (twelve) hours. 12/06/16 12/09/16 Yes Clanford Cyndie Mull, MD  apixaban (ELIQUIS) 5 MG TABS tablet Take 1 tablet (5 mg total) by mouth 2 (two) times daily. 11/06/16  Yes Estela Isaiah Blakes, MD  Balsam [place]-Castor Oil [facility]) OINT Apply 1 application topically 3 (three) times daily. Apply to sacrum and bilateral buttocks every shift and as needed.   Yes Historical Provider, MD  furosemide (LASIX) 40 MG tablet Take 1 tablet (40 mg total) by mouth daily. 11/07/16  Yes Estela Isaiah Blakes, MD  ipratropium-albuterol (DUONEB) 0.5-2.5 (3) MG/3ML SOLN Take 3 mLs by nebulization every 6 (six) hours as needed (shortness of breath).   Yes Historical Provider, MD  levothyroxine (SYNTHROID, LEVOTHROID) 50  MCG tablet Take 50 mcg by mouth daily before breakfast.   Yes Historical Provider, MD  metoprolol tartrate (LOPRESSOR) 25 MG tablet Take 1 tablet (25 mg total) by mouth 3 (three) times daily. 12/06/16  Yes Clanford Cyndie Mull, MD  Omega-3 Fatty Acids (FISH OIL) 1000 MG CAPS Take 1 capsule by mouth daily.   Yes Historical Provider, MD  polyethylene glycol (MIRALAX / GLYCOLAX) packet Take 17 g by mouth daily as needed for moderate constipation. 12/06/16  Yes Clanford Cyndie Mull, MD  potassium chloride (K-DUR) 10 MEQ tablet Take 10 mEq by mouth daily.   Yes Historical Provider, MD  Probiotic Product  (RISA-BID PROBIOTIC) TABS Take 1 tablet by mouth twice a day   Yes Historical Provider, MD  sodium chloride 1 g tablet Take 1 tablet (1 g total) by mouth 2 (two) times daily with a meal. 11/16/16  Yes Filbert Schilder, MD  OXYGEN Inhale 2 L into the lungs daily as needed. To keep sats greater or equal to 90%    Historical Provider, MD    Family History History reviewed. No pertinent family history.  Social History Social History  Substance Use Topics  . Smoking status: Never Smoker  . Smokeless tobacco: Never Used  . Alcohol use No     Allergies   Patient has no known allergies.   Review of Systems Review of Systems  Unable to perform ROS: Mental status change     Physical Exam Updated Vital Signs BP 124/78   Pulse 93   Resp 14   Ht 5\' 11"  (1.803 m)   Wt 242 lb (109.8 kg)   SpO2 93%   BMI 33.75 kg/m   Physical Exam  Constitutional: She appears well-developed and well-nourished. No distress.  HENT:  Head: Normocephalic and atraumatic.  Mouth/Throat: Oropharynx is clear and moist. No oropharyngeal exudate.  Dentition is very poor  Eyes: Conjunctivae and EOM are normal. Pupils are equal, round, and reactive to light. Right eye exhibits no discharge. Left eye exhibits no discharge. No scleral icterus.  Neck: Normal range of motion. Neck supple. No JVD present. No thyromegaly present.  Cardiovascular: Normal heart sounds and intact distal pulses.  Exam reveals no gallop and no friction rub.   No murmur heard. Regular rate, less than 100 bpm, irregularly irregular rhythm no obvious JVD but has diffuse bilateral lower extremity swelling as well as right upper extremity significant pitting edema  Pulmonary/Chest: Effort normal and breath sounds normal. No respiratory distress. She has no wheezes. She has no rales.  Abdominal: Soft. Bowel sounds are normal. She exhibits no distension and no mass. There is no tenderness.  Musculoskeletal: Normal range of motion. She  exhibits edema. She exhibits no tenderness.  Lymphadenopathy:    She has no cervical adenopathy.  Neurological: She is alert. Coordination normal.  The patient is able to follow commands with bilateral upper extremities with grip, she has severe diffuse weakness of all 4 extremities. She is able to follow my finger with her eyes, she opens and closes her mouth request. She is unable to speak and answer questions  Skin: Skin is warm and dry. No rash noted. No erythema.  Psychiatric: She has a normal mood and affect. Her behavior is normal.  Nursing note and vitals reviewed.    ED Treatments / Results  Labs (all labs ordered are listed, but only abnormal results are displayed) Labs Reviewed  CBC WITH DIFFERENTIAL/PLATELET - Abnormal; Notable for the following:  Result Value   WBC 13.1 (*)    Neutro Abs 11.0 (*)    All other components within normal limits  COMPREHENSIVE METABOLIC PANEL - Abnormal; Notable for the following:    Sodium 133 (*)    Chloride 91 (*)    CO2 36 (*)    Glucose, Bld 126 (*)    Calcium 7.7 (*)    Total Protein 6.0 (*)    Albumin 1.6 (*)    ALT 13 (*)    All other components within normal limits  URINALYSIS, ROUTINE W REFLEX MICROSCOPIC - Abnormal; Notable for the following:    APPearance HAZY (*)    Hgb urine dipstick LARGE (*)    Bacteria, UA FEW (*)    All other components within normal limits  PROTIME-INR - Abnormal; Notable for the following:    Prothrombin Time 23.5 (*)    All other components within normal limits  CBG MONITORING, ED - Abnormal; Notable for the following:    Glucose-Capillary 115 (*)    All other components within normal limits  CULTURE, BLOOD (ROUTINE X 2)  CULTURE, BLOOD (ROUTINE X 2)  URINE CULTURE  AMMONIA  TROPONIN I  I-STAT CG4 LACTIC ACID, ED    EKG  EKG Interpretation  Date/Time:  Friday December 07 2016 11:32:20 EST Ventricular Rate:  98 PR Interval:    QRS Duration: 113 QT Interval:  382 QTC  Calculation: 488 R Axis:   58 Text Interpretation:  Atrial fibrillation Incomplete left bundle branch block Low voltage, precordial leads Borderline prolonged QT interval Since last tracing rate slower Confirmed by Elleen Coulibaly  MD, Philomina Leon ([id number]) on 12/07/2016 12:36:59 PM       Radiology Ct Head Wo Contrast  Result Date: 12/07/2016 CLINICAL DATA:  Acute presentation with altered mental status. EXAM: CT HEAD WITHOUT CONTRAST TECHNIQUE: Contiguous axial images were obtained from the base of the skull through the vertex without intravenous contrast. COMPARISON:  None. FINDINGS: Brain: Mild generalized age related atrophy. No focal brainstem or cerebellar finding. Old small vessel infarctions in the thalami I, basal ganglia and cerebral hemispheric white matter. No large vessel territory infarction. No sign of acute insult. No mass lesion, hemorrhage, hydrocephalus or extra-axial collection. Vascular: There is atherosclerotic calcification of the major vessels at the base of the brain. Skull: Negative Sinuses/Orbits: Clear/normal Other: None IMPRESSION: Age related atrophy. Mild chronic small-vessel ischemic changes as outlined above. No acute or reversible finding. Electronically Signed   By: Paulina FusiMark  Shogry M.D.   On: 12/07/2016 13:52    Procedures Procedures (including critical care time)  Medications Ordered in ED Medications  0.9 %  sodium chloride infusion ( Intravenous New Bag/Given 12/07/16 1145)     Initial Impression / Assessment and Plan / ED Course  I have reviewed the triage vital signs and the nursing notes.  Pertinent labs & imaging results that were available during my care of the patient were reviewed by me and considered in my medical decision making (see chart for details).    The patient appears to have a significant change in her baseline mental status. Review of the medical records show that the patient has had cardiology evaluation, multiple inpatient admissions and ongoing  evaluation of the atrial fibrillation. She is currently taking eliquis for stroke prophylaxis, she has had her Lopressor increased and is continued on amiodarone 400 mg twice a day. The patient was taking Augmentin every 12 hours when she was discharged from the hospital. There was some concern that the  patient may have been aspirating and thus her diet was supposed to be changed to a dysphagia 2 diet. She did have an ultrasound of her upper extremity on the right showing no signs of DVT.  The patient has a back to normal mental status. She spontaneously did this without any significant interventions other than some normal saline hydration. After receiving approximately half a bag of IV fluids the patient has returned to her normal mental status, is answering my questions appropriately, follows commands and according to the family members is back to her normal self. I have reviewed her labs including the urinalysis showing hematuria, white blood cell count of 13,000, sodium of 133, calcium of 7.7, albumin of 1.6, all of these are abnormal but had been chronically abnormal and unlikely to cause the patient's symptoms today. The patient and family members have been informed of the abnormal results and agreed that the patient needs to follow-up closely. I do not see any specific medications that would be causing the patient's abnormal mental status. There was no signs of stroke on CT scan and she has no respiratory symptoms or abnormal vital signs to suggest a pneumonia. The patient is currently at this time stable for discharge back to her facility.    Final Clinical Impressions(s) / ED Diagnoses   Final diagnoses:  Altered mental status, unspecified altered mental status type  Hypocalcemia    New Prescriptions New Prescriptions   No medications on file     Eber Hong, MD 12/07/16 1418

## 2016-12-07 NOTE — ED Notes (Signed)
Coffee for pt per famly's request- upon return to rom, pt is asleep

## 2016-12-07 NOTE — ED Notes (Signed)
Per family, pt has awakened and is speaking to them- they report that they do not know why she cannot be awakened in the morning and wonder what medicines she is on at night

## 2016-12-07 NOTE — ED Notes (Signed)
To CT via stretcher

## 2016-12-08 ENCOUNTER — Encounter (HOSPITAL_COMMUNITY)
Admission: RE | Admit: 2016-12-08 | Discharge: 2016-12-08 | Disposition: A | Discharge status: Home / Self Care | Payer: Medicare HMO | Source: Skilled Nursing Facility | Attending: Internal Medicine | Admitting: Internal Medicine

## 2016-12-08 DIAGNOSIS — E871 Hypo-osmolality and hyponatremia: Secondary | ICD-10-CM | POA: Insufficient documentation

## 2016-12-08 DIAGNOSIS — R7989 Other specified abnormal findings of blood chemistry: Secondary | ICD-10-CM | POA: Insufficient documentation

## 2016-12-08 DIAGNOSIS — R6889 Other general symptoms and signs: Secondary | ICD-10-CM | POA: Insufficient documentation

## 2016-12-08 LAB — CBC WITH DIFFERENTIAL/PLATELET
Basophils Absolute: 0 10*3/uL (ref 0.0–0.1)
Basophils Relative: 0 %
Eosinophils Absolute: 0.2 10*3/uL (ref 0.0–0.7)
Eosinophils Relative: 2 %
HEMATOCRIT: 46.8 % — AB (ref 36.0–46.0)
HEMOGLOBIN: 15.3 g/dL — AB (ref 12.0–15.0)
LYMPHS ABS: 1.7 10*3/uL (ref 0.7–4.0)
LYMPHS PCT: 17 %
MCH: 30.3 pg (ref 26.0–34.0)
MCHC: 32.7 g/dL (ref 30.0–36.0)
MCV: 92.7 fL (ref 78.0–100.0)
MONOS PCT: 7 %
Monocytes Absolute: 0.7 10*3/uL (ref 0.1–1.0)
NEUTROS ABS: 7.4 10*3/uL (ref 1.7–7.7)
Neutrophils Relative %: 75 %
Platelets: 268 10*3/uL (ref 150–400)
RBC: 5.05 MIL/uL (ref 3.87–5.11)
RDW: 14.3 % (ref 11.5–15.5)
WBC: 10 10*3/uL (ref 4.0–10.5)

## 2016-12-08 LAB — BASIC METABOLIC PANEL
ANION GAP: 8 (ref 5–15)
BUN: 19 mg/dL (ref 6–20)
CHLORIDE: 92 mmol/L — AB (ref 101–111)
CO2: 33 mmol/L — AB (ref 22–32)
Calcium: 8 mg/dL — ABNORMAL LOW (ref 8.9–10.3)
Creatinine, Ser: 0.57 mg/dL (ref 0.44–1.00)
GFR calc non Af Amer: >60 mL/min (ref >60)
Glucose, Bld: 95 mg/dL (ref 65–99)
POTASSIUM: 4.2 mmol/L (ref 3.5–5.1)
Sodium: 133 mmol/L — ABNORMAL LOW (ref 135–145)

## 2016-12-09 ENCOUNTER — Non-Acute Institutional Stay (SKILLED_NURSING_FACILITY): Payer: Medicare HMO | Admitting: Internal Medicine

## 2016-12-09 DIAGNOSIS — I482 Chronic atrial fibrillation, unspecified: Secondary | ICD-10-CM

## 2016-12-09 DIAGNOSIS — F05 Delirium due to known physiological condition: Secondary | ICD-10-CM

## 2016-12-09 DIAGNOSIS — I5032 Chronic diastolic (congestive) heart failure: Secondary | ICD-10-CM | POA: Diagnosis not present

## 2016-12-09 DIAGNOSIS — E039 Hypothyroidism, unspecified: Secondary | ICD-10-CM

## 2016-12-09 LAB — URINE CULTURE: Culture: NO GROWTH

## 2016-12-09 NOTE — Progress Notes (Signed)
12/09/16  Facility; [facility]  Chief complaint; readmitted to the facility after hospitalization from 11/28/16 through 12/06/16 and also ER visit on 12/07/16  History; this is an [age] year old woman who really doesn't appear to have done well over the last month. She was initially admitted to hospital from 1/31 through 2/9 with newly diagnosed A. fib, diastolic heart failure, and hyponatremia. She was back in the hospital shortly for influenza A after her arrival here and readmitted to the facility. She is also now had another hospitalization from 11/28/16 through 12/06/16 with A. fib with rapid ventricular rate, healthcare acquired pneumonia and acute on chronic congestive heart failure. She was sent back to the ER on 12/07/16 with altered mental status. She was given some IV fluid I think perked up a bit and then was sent back to the facility. Over the last 24-48 hours she does not appear to be doing well. The nurse on the floor doesn't really know her but she apparently did not eat well yesterday but was able to be coursed into taking her medications with sips of fluid. Looking through her chart she had a CT scan of the head done last month that was negative for acute CVA. She also had a Doppler ultrasound of her right arm that did not show a DVT. Her last albumin was 1.6  CMP Latest Ref Rng & Units 12/08/2016 12/07/2016 12/06/2016  Glucose 65 - 99 mg/dL 95 086(V126(H) 784(O110(H)  BUN 6 - 20 mg/dL 19 19 19   Creatinine 0.44 - 1.00 mg/dL 9.620.57 9.520.55 8.410.61  Sodium 135 - 145 mmol/L 133(L) 133(L) 134(L)  Potassium 3.5 - 5.1 mmol/L 4.2 3.8 3.5  Chloride 101 - 111 mmol/L 92(L) 91(L) 90(L)  CO2 22 - 32 mmol/L 33(H) 36(H) 37(H)  Calcium 8.9 - 10.3 mg/dL 8.0(L) 7.7(L) 7.8(L)  Total Protein 6.5 - 8.1 g/dL - 6.0(L) 5.7(L)  Total Bilirubin 0.3 - 1.2 mg/dL - 0.8 0.7  Alkaline Phos 38 - 126 U/L - 51 52  AST 15 - 41 U/L - 26 28  ALT 14 - 54 U/L - 13(L) 13(L)   CBC Latest Ref Rng & Units 12/08/2016 12/07/2016 12/06/2016  WBC 4.0 - 10.5  K/uL 10.0 13.1(H) 13.5(H)  Hemoglobin 12.0 - 15.0 g/dL 15.3(H) 13.5 13.8  Hematocrit 36.0 - 46.0 % 46.8(H) 41.8 42.4  Platelets 150 - 400 K/uL 268 361 324    Past Medical History:  Diagnosis Date  . Atrial fibrillation (HCC)   . Essential hypertension   . Hypothyroidism   . Lumbago   . Neuralgia   . Neuritis     Past Surgical History:  Procedure Laterality Date  . ABDOMINAL HYSTERECTOMY    . APPENDECTOMY      Current Outpatient Prescriptions on File Prior to Visit  Medication Sig Dispense Refill  . acetaminophen (TYLENOL) 325 MG tablet Take 650 mg by mouth every 6 (six) hours as needed.    Marland Kitchen. amiodarone (PACERONE) 200 MG tablet Take 1 po BID x 1 week, then 1 po daily    . amoxicillin-clavulanate (AUGMENTIN) 875-125 MG tablet Take 1 tablet by mouth every 12 (twelve) hours. 6 tablet 0  . apixaban (ELIQUIS) 5 MG TABS tablet Take 1 tablet (5 mg total) by mouth 2 (two) times daily. 60 tablet 2  . Balsam Peru-Castor Oil (VENELEX) OINT Apply 1 application topically 3 (three) times daily. Apply to sacrum and bilateral buttocks every shift and as needed.    . furosemide (LASIX) 40 MG tablet Take  1 tablet (40 mg total) by mouth daily. 30 tablet 2  . ipratropium-albuterol (DUONEB) 0.5-2.5 (3) MG/3ML SOLN Take 3 mLs by nebulization every 6 (six) hours as needed (shortness of breath).    Marland Kitchen levothyroxine (SYNTHROID, LEVOTHROID) 50 MCG tablet Take 50 mcg by mouth daily before breakfast.    . metoprolol tartrate (LOPRESSOR) 25 MG tablet Take 1 tablet (25 mg total) by mouth 3 (three) times daily. 60 tablet 2  . Omega-3 Fatty Acids (FISH OIL) 1000 MG CAPS Take 1 capsule by mouth daily.    . OXYGEN Inhale 2 L into the lungs daily as needed. To keep sats greater or equal to 90%    . polyethylene glycol (MIRALAX / GLYCOLAX) packet Take 17 g by mouth daily as needed for moderate constipation. 14 each 0  . potassium chloride (K-DUR) 10 MEQ tablet Take 10 mEq by mouth daily.    . Probiotic Product  (RISA-BID PROBIOTIC) TABS Take 1 tablet by mouth twice a day    . sodium chloride 1 g tablet Take 1 tablet (1 g total) by mouth 2 (two) times daily with a meal. 30 tablet 0    Social  reports that she has never smoked. She has never used smokeless tobacco. She reports that she does not drink alcohol or use drugs.On reviewing this patient today she will not verbalize. I have no information on her prior living status or functional status  Family history; none available  Review of systems; not currently possible  Physical examination Gen. patient does not look particularly well. Level of responsiveness is not normal. HEENT; pupils are equal and reactive. His membranes are dry Neck; no masses noted Lymph; none palpable in the feet show clear or axillary areas Breasts; no overt masses Respiratory shallow air entry bilaterally no crackles wheezes work of breathing appears normal Cardiac heart sounds are irregular pulse 100 no murmurs JVP is not visible. She appears to be more volume contracted that her recent labs would otherwise suggest. Abdomen bowel sounds are absent, mildly distended. Not overtly tender GU; no suprapubic or CVA tenderness is obvious. Musculoskeletal; swelling of both knees without effusions area Extremities there is 4+ edema to the right arm below the elbow which is getting, some degree of edema in the left arm. Lesser degrees of edema in both legs. Neurologic; virtually impossible to get a meaningful exam on this lady she is not responding to commands. She would appear to be diffusely hyporeflexic. She has a right extensor plantar the left is flexor. Beyond this I could not really lateralize. She is diffusely hyporeflexic. Speech; occasional comprehensible words. I could not get her to anemia anything. Mental status; most compatible with an unresolved delirium  Impression/plan #1 profound failure to thrive. I attempted to feed her this morning I was able to get a bite or 2  when. He will with a straw she doesn't appear to have the strength to drink. This patient really requires an ethical discussion with the family.if They still wanted aggressive posture she is likely to need parenteral feeding at least temporarily. She is a full code in the facility. #2 probable refractory/prolonged delirium. The source of this is not really clear at the moment. #3 it is not really possible to do a meaningful neurologic on her although she had a CT scan of the head on 3/2 did not show anything acute #4 likely severe protein calorie malnutrition #5 does not appear to be currently in heart failure. I can't imagine she is  benefiting from Lasix at this point. She is not drinking. #6 atrial fibrillation. Pulse rate was 100 today O2 sat 95% on room air. #7 hypothyroidism on replacement. TSH was checked twice last month last 1.457 within the normal range 2/17 #8 massive edema of the right forearm greater than left area she had a duplex ultrasound that was negative for DVT #9 hyponatremic in early February. Cortisol level was checked and was normal. She remains on salt tablets. Sodium was 133 within the normal range.  I'll see if I can talk to family members later in the day is someone comes to see her. He began an ethical discussion about what sort of support this patient to offer this patient. She is clearly not able to speak for herself at this point

## 2016-12-11 ENCOUNTER — Non-Acute Institutional Stay (SKILLED_NURSING_FACILITY): Payer: Medicare HMO | Admitting: Internal Medicine

## 2016-12-11 ENCOUNTER — Encounter (HOSPITAL_COMMUNITY)
Admission: RE | Admit: 2016-12-11 | Discharge: 2016-12-11 | Disposition: A | Discharge status: Home / Self Care | Payer: Medicare HMO | Source: Skilled Nursing Facility | Attending: Internal Medicine | Admitting: Internal Medicine

## 2016-12-11 DIAGNOSIS — J111 Influenza due to unidentified influenza virus with other respiratory manifestations: Secondary | ICD-10-CM | POA: Insufficient documentation

## 2016-12-11 DIAGNOSIS — I5032 Chronic diastolic (congestive) heart failure: Secondary | ICD-10-CM | POA: Insufficient documentation

## 2016-12-11 DIAGNOSIS — R2231 Localized swelling, mass and lump, right upper limb: Secondary | ICD-10-CM | POA: Insufficient documentation

## 2016-12-11 DIAGNOSIS — R739 Hyperglycemia, unspecified: Secondary | ICD-10-CM | POA: Insufficient documentation

## 2016-12-11 DIAGNOSIS — D72829 Elevated white blood cell count, unspecified: Secondary | ICD-10-CM | POA: Diagnosis not present

## 2016-12-11 DIAGNOSIS — R609 Edema, unspecified: Secondary | ICD-10-CM | POA: Insufficient documentation

## 2016-12-11 DIAGNOSIS — R627 Adult failure to thrive: Secondary | ICD-10-CM

## 2016-12-11 LAB — COMPREHENSIVE METABOLIC PANEL
ALK PHOS: 53 U/L (ref 38–126)
ALT: 13 U/L — AB (ref 14–54)
AST: 20 U/L (ref 15–41)
Albumin: 1.9 g/dL — ABNORMAL LOW (ref 3.5–5.0)
Anion gap: 8 (ref 5–15)
BUN: 21 mg/dL — AB (ref 6–20)
CALCIUM: 8.2 mg/dL — AB (ref 8.9–10.3)
CO2: 36 mmol/L — AB (ref 22–32)
CREATININE: 0.56 mg/dL (ref 0.44–1.00)
Chloride: 97 mmol/L — ABNORMAL LOW (ref 101–111)
GFR calc non Af Amer: >60 mL/min (ref >60)
GLUCOSE: 112 mg/dL — AB (ref 65–99)
Potassium: 3.5 mmol/L (ref 3.5–5.1)
SODIUM: 141 mmol/L (ref 135–145)
Total Bilirubin: 0.7 mg/dL (ref 0.3–1.2)
Total Protein: 6.2 g/dL — ABNORMAL LOW (ref 6.5–8.1)

## 2016-12-11 LAB — CBC WITH DIFFERENTIAL/PLATELET
BASOS ABS: 0 10*3/uL (ref 0.0–0.1)
Basophils Relative: 0 %
EOS ABS: 0.1 10*3/uL (ref 0.0–0.7)
Eosinophils Relative: 1 %
HCT: 42.3 % (ref 36.0–46.0)
HEMOGLOBIN: 13.6 g/dL (ref 12.0–15.0)
LYMPHS PCT: 16 %
Lymphs Abs: 1.7 10*3/uL (ref 0.7–4.0)
MCH: 30.2 pg (ref 26.0–34.0)
MCHC: 32.2 g/dL (ref 30.0–36.0)
MCV: 94 fL (ref 78.0–100.0)
Monocytes Absolute: 0.7 10*3/uL (ref 0.1–1.0)
Monocytes Relative: 6 %
NEUTROS PCT: 77 %
Neutro Abs: 8.4 10*3/uL — ABNORMAL HIGH (ref 1.7–7.7)
PLATELETS: 326 10*3/uL (ref 150–400)
RBC: 4.5 MIL/uL (ref 3.87–5.11)
RDW: 14.8 % (ref 11.5–15.5)
WBC: 10.9 10*3/uL — AB (ref 4.0–10.5)

## 2016-12-11 NOTE — Progress Notes (Signed)
This is an acute visit.  Level care skilled.  Facility is [facility].  Chief complaint-acute visit  follow-up possible delirium.  History of results.  Patient is an 83 year old female who has had a difficult course the past several weeks.  She was initially admitted to the hospital in late January with newly diagnosed-relation-diastolic CHF and hyponatremia.  She was returned to the hospital for influenza A after her arrival to skilled nursing.  She also had a recent hospitalization in late February for A. fib with rapid ventricular rate healthcare associated pneumonia and acute on chronic congestive heart failure.  She returned back to the ER on March 2 with altered mental status she was given IV fluid and apparently perked up a bit and sent back to the facility.  She had a somewhat difficult weekend and not eating and drinking much.  She did have a CT scan done in the hospital last month at which did not show any acute CVA.  She also a Doppler ultrasound of her right arm that did not show a DVT she does continue to have edema in the arm.  According to nursing staff she is eating and drinking a bit better with support the last couple days although she still have periods of what appears to be some delirium.  She actually has gone to restorative feeding several times. Her vital signs are stable.  She did have labs done today which showed relative stability-sodium is 141 potassium 3.5 BUN 21 creatinine 0.56 CO2 level appears relatively baseline at 36.  Albumin is 1.9 which shows some slight room and.  Hemoglobin appears to be fairly stable at 13.6 white count is 10.9 it had been 10 3 days ago had been in the 13,000 range previously.  Past Medical History:  Diagnosis Date  . Atrial fibrillation (HCC)   . Essential hypertension   . Hypothyroidism   . Lumbago   . Neuralgia   . Neuritis          Past Surgical History:  Procedure Laterality Date  . ABDOMINAL  HYSTERECTOMY    . APPENDECTOMY            Current Outpatient Prescriptions on File Prior to Visit  Medication Sig Dispense Refill  . acetaminophen (TYLENOL) 325 MG tablet Take 650 mg by mouth every 6 (six) hours as needed.    Marland Kitchen amiodarone (PACERONE) 200 MG tablet Take 1 po BID x 1 week, then 1 po daily    .     0  . apixaban (ELIQUIS) 5 MG TABS tablet Take 1 tablet (5 mg total) by mouth 2 (two) times daily. 60 tablet 2  . Balsam [place]-Castor Oil (VENELEX) OINT Apply 1 application topically 3 (three) times daily. Apply to sacrum and bilateral buttocks every shift and as needed.    .     2  . ipratropium-albuterol (DUONEB) 0.5-2.5 (3) MG/3ML SOLN Take 3 mLs by nebulization every 6 (six) hours as needed (shortness of breath).    Marland Kitchen levothyroxine (SYNTHROID, LEVOTHROID) 50 MCG tablet Take 50 mcg by mouth daily before breakfast.    . metoprolol tartrate (LOPRESSOR) 25 MG tablet Take 1 tablet (25 mg total) by mouth 3 (three) times daily. 60 tablet 2  . Omega-3 Fatty Acids (FISH OIL) 1000 MG CAPS Take 1 capsule by mouth daily.    . OXYGEN Inhale 2 L into the lungs daily as needed. To keep sats greater or equal to 90%    . polyethylene glycol (MIRALAX /  GLYCOLAX) packet Take 17 g by mouth daily as needed for moderate constipation. 14 each 0  .       .       . sodium chloride 1 g tablet Take 1 tablet (1 g total) by mouth 2 (two) times daily with a meal. 30 tablet 0    Social  reports that she has never smoked. She has never used smokeless tobacco. She reports that she does not drink alcohol or use drugs.On reviewing this patient today she will not verbalize. I have no information on her prior living status or functional status  Family history; none available   Review of systems this is largely obtained from nursing patient appears to be poor historian however when I asked her she says she is not hurting or short of breath denies fever chills.  According to nursing  again she does have intermittent appetite with family is here apparently she does somewhat better she has gone to restorative apparently several times and eat and drink.  Have not noted any increased pain complaints shortness of breath chest pain fever or chills   Physical exam.  Temperature is 98.0 pulse 79 respirations 18 blood pressure 138/86 O2 saturation is 96% weight is 236.6 this appears to be relatively stable and was 234.2 back on February 22-239.8 on March 3  In general this is a frail appearing elderly female does not appear in distress somewhat lethargic but she is responsive and does answer questions.  Her skin is warm and dry she does have some edema, erythema of her arms bilaterally there is wrapping of her lower right arm again with edema which is not new.  Oropharynx appears clear mucous membranes appear fairly moist.  Chest she has shallow air entry with poor respiratory effort I could not appreciate any overt congestion or labored breathing however.  Heart is a regular irregular with pulse around 80.  She has appears fairly mild lower extremity edema she has protective boots on bilaterally-the edema is more pronounced in her arms especially her right arm and apparently this is not new.  Abdomen is somewhat obese soft does not appear to be tender there are positive bowel sounds slightly hypoactive.  GU could not really appreciate suprapubic tenderness.  Musculoskeletal does move all extremities 4 with significant lower extremity weakness which appears not to be new she does have the edema of her arms bilaterally most significant area of her right arm.  Neurologic she is responsive she will speak in does answer at times appropriately she will follow simple verbal commands.  Labs.  12/11/2016.  Sodium 142 potassium 3.5 BUN 21 creatinine 0.56.  Albumin 1.9.  WBC 10.9 hemoglobin 13.6 platelets 326   Assessment plan.  Failure to thrive-apparently she is has  good times during the day and not so good times during the day she was up earlier today in restorative feeding per nursing staff and did eat and drink some-this continues to be a challenge however her weight appears to be relatively stable possibly a loss of about 3 pounds over the last several days although baseline with what her weight was back on February 22-continue to monitor this--she will need a BMP later this week. Albumin has shown slight improvement at 1.9-she does continue on supplements Of note her Lasix was discontinued it appears by Dr. Leanord Hawking I suspect secondary to concerns of failure to thrive and risk of dehydration  There is some thought down the road that she may require a feeding  tube pending family decision-this was discussed with Dr. Leanord Hawkingobson via phone.  #2 intermittent delirium-this appears to persist possibly improved however since Dr. Leanord Hawkingobson saw her this weekend will order psychiatric consult as well.  #3 minimally elevated white count-she does not have a fever I do not see overt signs of infection although she is a poor historian-will update this tomorrow to see if this is a true trend continue to monitor.    [id number]

## 2016-12-12 ENCOUNTER — Non-Acute Institutional Stay (SKILLED_NURSING_FACILITY): Payer: Medicare HMO | Admitting: Internal Medicine

## 2016-12-12 ENCOUNTER — Encounter: Payer: Self-pay | Admitting: Internal Medicine

## 2016-12-12 ENCOUNTER — Encounter (HOSPITAL_COMMUNITY)
Admission: RE | Admit: 2016-12-12 | Discharge: 2016-12-12 | Disposition: A | Discharge status: Home / Self Care | Payer: Medicare HMO | Source: Skilled Nursing Facility | Attending: Internal Medicine | Admitting: Internal Medicine

## 2016-12-12 DIAGNOSIS — R627 Adult failure to thrive: Secondary | ICD-10-CM

## 2016-12-12 DIAGNOSIS — J111 Influenza due to unidentified influenza virus with other respiratory manifestations: Secondary | ICD-10-CM | POA: Insufficient documentation

## 2016-12-12 DIAGNOSIS — R739 Hyperglycemia, unspecified: Secondary | ICD-10-CM | POA: Insufficient documentation

## 2016-12-12 DIAGNOSIS — R2231 Localized swelling, mass and lump, right upper limb: Secondary | ICD-10-CM | POA: Insufficient documentation

## 2016-12-12 DIAGNOSIS — I5032 Chronic diastolic (congestive) heart failure: Secondary | ICD-10-CM | POA: Insufficient documentation

## 2016-12-12 DIAGNOSIS — R609 Edema, unspecified: Secondary | ICD-10-CM | POA: Insufficient documentation

## 2016-12-12 LAB — CBC WITH DIFFERENTIAL/PLATELET
BASOS ABS: 0 10*3/uL (ref 0.0–0.1)
Basophils Relative: 0 %
EOS PCT: 3 %
Eosinophils Absolute: 0.3 10*3/uL (ref 0.0–0.7)
HCT: 38.8 % (ref 36.0–46.0)
Hemoglobin: 12.2 g/dL (ref 12.0–15.0)
LYMPHS PCT: 19 %
Lymphs Abs: 2 10*3/uL (ref 0.7–4.0)
MCH: 29.5 pg (ref 26.0–34.0)
MCHC: 31.4 g/dL (ref 30.0–36.0)
MCV: 93.9 fL (ref 78.0–100.0)
MONO ABS: 0.6 10*3/uL (ref 0.1–1.0)
Monocytes Relative: 6 %
Neutro Abs: 7.4 10*3/uL (ref 1.7–7.7)
Neutrophils Relative %: 72 %
PLATELETS: 328 10*3/uL (ref 150–400)
RBC: 4.13 MIL/uL (ref 3.87–5.11)
RDW: 14.9 % (ref 11.5–15.5)
WBC: 10.3 10*3/uL (ref 4.0–10.5)

## 2016-12-12 LAB — CULTURE, BLOOD (ROUTINE X 2)
Culture: NO GROWTH
Culture: NO GROWTH

## 2016-12-12 NOTE — Progress Notes (Signed)
Location:   [facility] Room Number: 131/P Place of Service:  SNF (31) Provider:  Adah PerlArlo Detra Bores  BAUCOM, JENNY B, PA-C  Patient Care Team: Phyllis GingerJenny B Baucom, PA-C as PCP - General (Physician Assistant)  Extended Emergency Contact Information Primary Emergency Contact: Pilar GrammesGraves,[place]  [place] Home Phone: [phone] Mobile Phone: [phone] Relation: Daughter Secondary Emergency Contact: Fransisco HertzWalker,Jerry  [place] Home Phone: [phone] Relation: Son  Code Status:  Full Code Goals of care: Advanced Directive information Advanced Directives 12/12/2016  Does Patient Have a Medical Advance Directive? Yes  Type of Advance Directive (No Data)  Does patient want to make changes to medical advance directive? No - Patient declined  Would patient like information on creating a medical advance directive? No - Patient declined     Chief Complaint  Patient presents with  . Acute Visit  Secondary to family conference over failure to thrive issues  HPI:  Pt is a 83 y.o. female seen today for an acute visit for follow-up of failure to thrive.  She has had quite a difficult course over the last several weeks.  She initially was admitted to the hospital with new diagnosis of A. fib diastolic CHF and hyponatremia-she went back to the hospital for influenza A after her arrival to skilled nursing.  She also had another hospitalization in late February with A. fib and was sent to the ER on March 2 218 with ulcer mental status she got some IV fluids and apparently perked up and was sent back to the facility.  Over last several days she doesn't appear to be doing consistently well she will be more alert at times and other times somewhat unresponsive.  Her appetite is been very spotty in quite poor generally.  She did have a CT scan of the head done last month that was negative for an acute CVA-.  Today her vital signs appear to be  relatively stable CBGs have been in the 90s to low 100s.  She does not appear to be in any distress but continues to be quite lethargic at times apparently at times she will eat  She appears to have gained a small amount of weight although this may be secondary to scale variation-she also does have some edema she had been on Lasix but this is being held secondary to concerns of dehydration with her poor by mouth intake.  There was a family conference today that included rations daughter the director of nursing speech therapy as well as Child psychotherapistsocial worker.  Her daughter expressed desires for fairly aggressive measures including maintaining full CODE STATUS-as well as most likely a feeding tube.  She would like to discuss the feeding tube however with other family members before Fully pursuing this.  Options were discussed with her daughter including possible risk of tube placement including risk of recurrent aspiration as well as cellulitis and infection.  Her daughter expressed understanding but at this point appears to want PEG tube pending discussion with other family members.  Currently patient is not in any distress but she has periods of significant confusion and lethargy which persists.  We did do lab work today which showed her white count of 10.3 which is down slightly from yesterday hemoglobin is 12.2 platelets 328 labs done yesterday show a sodium of 141 potassium 3.5 BUN 21 creatinine 0.56 albumin is up slightly at 1.9 up from 1.6 previously     Past Medical History:  Diagnosis Date  . Atrial  fibrillation (HCC)   . Essential hypertension   . Hypothyroidism   . Lumbago   . Neuralgia   . Neuritis    Past Surgical History:  Procedure Laterality Date  . ABDOMINAL HYSTERECTOMY    . APPENDECTOMY      No Known Allergies  Allergies as of 12/12/2016   No Known Allergies     Medication List       Accurate as of 12/12/16  4:22 PM. Always use your most recent med list.            acetaminophen 325 MG tablet Commonly known as:  TYLENOL Take 650 mg by mouth every 6 (six) hours as needed.   amiodarone 200 MG tablet Commonly known as:  PACERONE Take 1 po BID x 1 week, then 1 po daily   apixaban 5 MG Tabs tablet Commonly known as:  ELIQUIS Take 1 tablet (5 mg total) by mouth 2 (two) times daily.   ipratropium-albuterol 0.5-2.5 (3) MG/3ML Soln Commonly known as:  DUONEB Take 3 mLs by nebulization every 6 (six) hours as needed (shortness of breath).   levothyroxine 50 MCG tablet Commonly known as:  SYNTHROID, LEVOTHROID Take 50 mcg by mouth daily before breakfast.   metoprolol 50 MG tablet Commonly known as:  LOPRESSOR Take 50 mg by mouth 3 (three) times daily.   OXYGEN Inhale 2 L into the lungs daily as needed. To keep sats greater or equal to 90%   polyethylene glycol packet Commonly known as:  MIRALAX / GLYCOLAX Take 17 g by mouth daily as needed for moderate constipation.   sodium chloride 1 g tablet Take 1 tablet (1 g total) by mouth 2 (two) times daily with a meal.   VENELEX Oint Apply 1 application topically 3 (three) times daily. Apply to sacrum and bilateral buttocks every shift and as needed.       Review of Systems   This is essentially unattainable secondary to patient's confusion-somewhat lethargic status please see history of present illness he does not appear to be in any pain or distress but appears to have significant failure to thrive   There is no immunization history on file for this patient. Pertinent  Health Maintenance Due  Topic Date Due  . DEXA SCAN  12/29/1995  . INFLUENZA VACCINE  09/07/2017 (Originally 05/08/2016)  . PNA vac Low Risk Adult (1 of 2 - PCV13) 09/07/2017 (Originally 12/29/1995)   No flowsheet data found. Functional Status Survey:    She is afebrile pulse 86 respirations 21 blood pressure 146/80 O2 sats ration is 95% weight is 238.8.    Physical Exam   In general this is a frail appearing elderly  female does not appear to be in distress she will answer simple questions she does make eye contact but appears quite weak.  Her skin is warm and dry she does have some edema of her arms more so on the right versus left the right lower arm is currently wrapped.  Eyes pupils appear reactive light sclerae and conjunctivae are clear visual acuity appears to be intact.  Chest she has shallow air entry but could not really appreciate any overt congestion no labored breathing.  Heart is irregular irregular rate and rhythm somewhat distant heart sounds she does have fairly baseline lower extremity edema again edema of her upper extremities bilaterally more so on the right.  Her abdomen is obese soft nontender with somewhat reduced bowel sounds.  GU could not really appreciate any suprapubic distention or acute  tenderness.  Muscle skeletal has diffuse weakness most possible lower extremities bilaterally.  Neurologic again difficult to fully assess since patient is somewhat lethargic-she is alert and actually said hello goodbye but at times will not really responding to verbal commands.    Labs reviewed:  Recent Labs  11/13/16 0550 11/14/16 0459 11/15/16 0437 11/16/16 0556  11/29/16 0426  12/07/16 1128 12/08/16 0758 12/11/16 0700  NA 123* 126* 129* 129*  < > 133*  < > 133* 133* 141  K 3.5 3.9 4.2 4.2  < > 3.6  < > 3.8 4.2 3.5  CL 78* 82* 88* 88*  < > 92*  < > 91* 92* 97*  CO2 35* 30 32 35*  < > 32  < > 36* 33* 36*  GLUCOSE 131* 115* 103* 126*  < > 101*  < > 126* 95 112*  BUN 13 12 12 13   < > 22*  < > 19 19 21*  CREATININE 0.56 0.56 0.66 0.63  < > 0.67  < > 0.55 0.57 0.56  CALCIUM 7.8* 8.0* 7.8* 8.2*  < > 7.9*  < > 7.7* 8.0* 8.2*  MG 1.3*  --  1.8  --   --  1.6*  --   --   --   --   PHOS  --  2.6  --  3.4  --   --   --   --   --   --   < > = values in this interval not displayed.  Recent Labs  12/06/16 0519 12/07/16 1128 12/11/16 0700  AST 28 26 20   ALT 13* 13* 13*  ALKPHOS  52 51 53  BILITOT 0.7 0.8 0.7  PROT 5.7* 6.0* 6.2*  ALBUMIN 1.6* 1.6* 1.9*    Recent Labs  12/08/16 0758 12/11/16 0700 12/12/16 0700  WBC 10.0 10.9* 10.3  NEUTROABS 7.4 8.4* 7.4  HGB 15.3* 13.6 12.2  HCT 46.8* 42.3 38.8  MCV 92.7 94.0 93.9  PLT 268 326 328   Lab Results  Component Value Date   TSH 1.457 11/24/2016   No results found for: HGBA1C No results found for: CHOL, HDL, LDLCALC, LDLDIRECT, TRIG, CHOLHDL  Significant Diagnostic Results in last 30 days:  Dg Chest 2 View  Result Date: 11/17/2016 CLINICAL DATA:  Recent positive test for flu, shortness of breath EXAM: CHEST  2 VIEW COMPARISON:  11/10/2016 FINDINGS: Stable cardiomegaly and tiny effusions. No interval consolidation. No pneumothorax. Mild linear atelectasis in the left upper lobe. IMPRESSION: Cardiomegaly with tiny effusions. No interval development of acute focal pulmonary infiltrate Electronically Signed   By: Jasmine Pang M.D.   On: 11/17/2016 01:39   Ct Head Wo Contrast  Result Date: 12/07/2016 CLINICAL DATA:  Acute presentation with altered mental status. EXAM: CT HEAD WITHOUT CONTRAST TECHNIQUE: Contiguous axial images were obtained from the base of the skull through the vertex without intravenous contrast. COMPARISON:  None. FINDINGS: Brain: Mild generalized age related atrophy. No focal brainstem or cerebellar finding. Old small vessel infarctions in the thalami I, basal ganglia and cerebral hemispheric white matter. No large vessel territory infarction. No sign of acute insult. No mass lesion, hemorrhage, hydrocephalus or extra-axial collection. Vascular: There is atherosclerotic calcification of the major vessels at the base of the brain. Skull: Negative Sinuses/Orbits: Clear/normal Other: None IMPRESSION: Age related atrophy. Mild chronic small-vessel ischemic changes as outlined above. No acute or reversible finding. Electronically Signed   By: Paulina Fusi M.D.   On: 12/07/2016 13:52  US Venous Img  Upper Uni Right  Result Date: 11/29/2016 CLINICAL DATA:  Right arm swelling EXAM: Right UPPER EXTREMITY VENOUS DOPPLER ULTRASOUND TECHNIQUE: Gray-scale sonography with graded compression, as well as color Doppler and duplex ultrasound were performed to evaluate the upper extremity deep venous system from the level of the subclavian vein and including the jugular, axillary, basilic, radial, ulnar and upper cephalic vein. Spectral Doppler was utilized to evaluate flow at rest and with distal augmentation maneuvers. COMPARISON:  None. FINDINGS: Contralateral Subclavian Vein: Respiratory phasicity is normal and symmetric with the symptomatic side. No evidence of thrombus. Normal compressibility. Internal Jugular Vein: No evidence of thrombus. Normal compressibility, respiratory phasicity and response to augmentation. Subclavian Vein: No evidence of thrombus. Normal compressibility, respiratory phasicity and response to augmentation. Axillary Vein: No evidence of thrombus. Normal compressibility, respiratory phasicity and response to augmentation. Cephalic Vein: No evidence of thrombus. Normal compressibility, respiratory phasicity and response to augmentation. Basilic Vein: No evidence of thrombus. Normal compressibility, respiratory phasicity and response to augmentation. Brachial Veins: No evidence of thrombus. Normal compressibility, respiratory phasicity and response to augmentation. Radial Veins: No evidence of thrombus. Normal compressibility, respiratory phasicity and response to augmentation. Ulnar Veins: No evidence of thrombus. Normal compressibility, respiratory phasicity and response to augmentation. Venous Reflux:  None visualized. Other Findings:  None visualized. IMPRESSION: No evidence of deep venous thrombosis. Electronically Signed   By: Marlan Palau M.D.   On: 11/29/2016 11:58   Dg Chest Port 1 View  Result Date: 11/28/2016 CLINICAL DATA:  Acute onset of shortness of breath and tachycardia.  Right-sided soft tissue edema. Initial encounter. EXAM: PORTABLE CHEST 1 VIEW COMPARISON:  Chest radiograph performed 11/17/2016 FINDINGS: The lungs are well-aerated. Left basilar opacity may reflect a combination of airspace opacification and small left pleural effusion. Underlying vascular congestion is noted. No pneumothorax is seen. Right paratracheal prominence is again noted. If not previously assessed, CT of the chest could be considered for further evaluation on an elective nonemergent basis. The heart is mildly enlarged. No acute osseous abnormalities are seen. IMPRESSION: 1. Left basilar opacity may reflect a combination of airspace opacification and small left pleural effusion. Pneumonia or possibly mild asymmetric interstitial edema could have such an appearance. 2. Underlying vascular congestion and mild cardiomegaly. 3. Right paratracheal prominence again noted. If not previously assessed, CT of the chest could be considered for further evaluation on an elective nonemergent basis. Electronically Signed   By: Roanna Raider M.D.   On: 11/28/2016 16:35   Dg Swallowing Func-speech Pathology  Result Date: 11/29/2016 Objective Swallowing Evaluation: Type of Study: MBS-Modified Barium Swallow Study Patient Details Name: Royanne Warshaw MRN: [id number] Date of Birth: [birth year] Today's Date: 11/29/2016 Time: SLP Start Time (ACUTE ONLY): 1337-SLP Stop Time (ACUTE ONLY): 1405 SLP Time Calculation (min) (ACUTE ONLY): 28 min Past Medical History: Past Medical History: Diagnosis Date . Atrial fibrillation (HCC)  . Essential hypertension  . Hypothyroidism  . Lumbago  . Neuralgia  . Neuritis  Past Surgical History: Past Surgical History: Procedure Laterality Date . ABDOMINAL HYSTERECTOMY   . APPENDECTOMY   HPI: Talecia Sherlin a 83 y.o.femalewith medical history significant ofHTN, PAF on Eliquis, diastolic CHF, and hypothyroidism; who presents from the [facility] for a fastirregular heartbeat noticed this afternoon  by staff. According to family present at bedside she has had increasing swelling in her right arms and bilateral legs. Patient complains of feeling short of breath. Review of records shows that the patient has beenhospitalized multiple times within the  last month1/25-1/30, 1/31-2/9, and then 2/9-2/10. Treated for atrial fibrillation, diastolic CHF, influenza, and community-acquired pneumonia. Other associated symptoms include decreased appetite and generalized weakness. Patient denies any chest pain, dysuria, nausea, vomiting, orabdominal pain. Review of records shows the patient weight back on 2/6was 224 pounds. Upon admission into the emergency department patient was seen to be afebrile, pulse 120-140s, respirations 20-34, blood pressure 96/71-131/80, and O2 saturations maintained at room air. Lab work revealed WBC 10.8, sodium 133, chloride 91, CO2 33, BUN 23, creatinine 0.71, calcium 8.1, glucose 135, troponin 0.03, andBNP 240. Due to the patient's elevated heart rates she was placed on a diltiazem drip. TRH called to admit. Recommended empiric antibiotics of vancomycin and Zosyn for possibility of a healthcare associated pneumonia. SLP ordered for BSE due to concerns about aspiration risk. Subjective: "Okay." Assessment / Plan / Recommendation CHL IP CLINICAL IMPRESSIONS 11/29/2016 Clinical Impression Pt presents with mild/mod oral phase, mild pharyngeal phase, and suspected primary esophageal phase dysphagia. Oral phase is marked by reduced lingual manipulation of bolus resulting in decreased bolus cohesiveness with decreased anterior posterior oral transit, premature spillage over base of tongue with liquids, piecemeal deglutition with liquid oral residues after initial/primary swallow. Swallow initiation is triggered after spilling/filling the pyriforms with cup/straw sips of thin and cup sips of NTL. Pt demonstrates adequate hyolaryngeal excursion, epiglottic deflection, and airway protection with no  penetration or aspiration observed over course of study. Pt does have oral residuals after initial bolus/swallow of liquids which trickle to valleculae and pyriforms after the primary swallow which pt does not immediately sense, however these were never penetrated or aspirated when SLP observed without cuing her to swallow to clear. She does eventually spontaneously clear, however these residuals do pose a risk for penetration/aspiration. These residuals in pharynx can be cleared with caregiver cue to swallow two times for each sip of liquid. Esophageal sweep revealed stasis of barium/bolus in distal third of esophagus likely due to dysmotility. The barium tablet traversed esophagus and through LES without significant delay. Occasionally, backflow of bolus noted to move from cervical esophagus to UES, but was not noted to reflux back into pyriforms. Pt's greatest risks for aspiration appear related to liquid oral residuals pooling in pharynx after the primary swallow and also stasis in esophagus (threat of laryngopharyngeal reflux), however not observed today. Risks can be minimized by consuming soft textures (ok for mech soft however pt seems to prefer puree clinically) and thin liquids in upright position and remaining upright at least 30 minutes after meals with caregiver cues to swallow twice for each sip (whether cup or straw). Pt was assessed with nectar-thick liquids and found not to be better tolerated than thins. Results of MBSS reviewed with Pt's daughter. SLP Visit Diagnosis Dysphagia, oropharyngeal phase (R13.12);Dysphagia, pharyngeal phase (R13.13) Attention and concentration deficit following -- Frontal lobe and executive function deficit following -- Impact on safety and function Mild aspiration risk   CHL IP TREATMENT RECOMMENDATION 11/29/2016 Treatment Recommendations Therapy as outlined in treatment plan below   Prognosis 11/29/2016 Prognosis for Safe Diet Advancement Fair Barriers to Reach Goals  Behavior Barriers/Prognosis Comment -- CHL IP DIET RECOMMENDATION 11/29/2016 SLP Diet Recommendations Dysphagia 2 (Fine chop) solids;Thin liquid Liquid Administration via Cup;Straw Medication Administration Whole meds with liquid Compensations Slow rate;Multiple dry swallows after each bite/sip Postural Changes Remain semi-upright after after feeds/meals (Comment);Seated upright at 90 degrees   CHL IP OTHER RECOMMENDATIONS 11/29/2016 Recommended Consults -- Oral Care Recommendations Oral care BID;Staff/trained caregiver to provide oral care Other  Recommendations Clarify dietary restrictions   CHL IP FOLLOW UP RECOMMENDATIONS 11/29/2016 Follow up Recommendations Skilled Nursing facility   [facility] IP FREQUENCY AND DURATION 11/29/2016 Speech Therapy Frequency (ACUTE ONLY) min 2x/week Treatment Duration 1 week      CHL IP ORAL PHASE 11/29/2016 Oral Phase Impaired Oral - Pudding Teaspoon -- Oral - Pudding Cup -- Oral - Honey Teaspoon -- Oral - Honey Cup -- Oral - Nectar Teaspoon -- Oral - Nectar Cup Reduced posterior propulsion;Lingual/palatal residue;Delayed oral transit Oral - Nectar Straw -- Oral - Thin Teaspoon -- Oral - Thin Cup Reduced posterior propulsion;Lingual/palatal residue;Piecemeal swallowing;Delayed oral transit;Premature spillage Oral - Thin Straw -- Oral - Puree WFL;Delayed oral transit Oral - Mech Soft Impaired mastication;Reduced posterior propulsion;Piecemeal swallowing;Delayed oral transit Oral - Regular -- Oral - Multi-Consistency -- Oral - Pill -- Oral Phase - Comment --  CHL IP PHARYNGEAL PHASE 11/29/2016 Pharyngeal Phase Impaired Pharyngeal- Pudding Teaspoon -- Pharyngeal -- Pharyngeal- Pudding Cup -- Pharyngeal -- Pharyngeal- Honey Teaspoon -- Pharyngeal -- Pharyngeal- Honey Cup -- Pharyngeal -- Pharyngeal- Nectar Teaspoon -- Pharyngeal -- Pharyngeal- Nectar Cup Pharyngeal residue - valleculae;Pharyngeal residue - pyriform;Delayed swallow initiation-pyriform sinuses Pharyngeal -- Pharyngeal- Nectar Straw  -- Pharyngeal -- Pharyngeal- Thin Teaspoon Delayed swallow initiation-pyriform sinuses Pharyngeal -- Pharyngeal- Thin Cup Delayed swallow initiation-pyriform sinuses;Pharyngeal residue - valleculae;Pharyngeal residue - pyriform Pharyngeal -- Pharyngeal- Thin Straw Delayed swallow initiation-pyriform sinuses;Pharyngeal residue - valleculae;Pharyngeal residue - pyriform Pharyngeal -- Pharyngeal- Puree Delayed swallow initiation-vallecula Pharyngeal -- Pharyngeal- Mechanical Soft Delayed swallow initiation-vallecula;Pharyngeal residue - cp segment Pharyngeal -- Pharyngeal- Regular -- Pharyngeal -- Pharyngeal- Multi-consistency -- Pharyngeal -- Pharyngeal- Pill -- Pharyngeal -- Pharyngeal Comment pharynx clear after primary bolus swallow, but oral residuals (liquids) pool into valleculae and pyriforms after the swallow  CHL IP CERVICAL ESOPHAGEAL PHASE 11/29/2016 Cervical Esophageal Phase Impaired Pudding Teaspoon -- Pudding Cup -- Honey Teaspoon -- Honey Cup -- Nectar Teaspoon -- Nectar Cup -- Nectar Straw -- Thin Teaspoon -- Thin Cup -- Thin Straw -- Puree -- Mechanical Soft Esophageal backflow into cervical esophagus Regular -- Multi-consistency -- Pill -- Cervical Esophageal Comment mild retention/backflow into UES from cervical esophagus CHL IP GO 11/29/2016 Functional Assessment Tool Used clinical judgment Functional Limitations Swallowing Swallow Current Status (W1191) CJ Swallow Goal Status (Y7829) CI Swallow Discharge Status (F6213) (None) Motor Speech Current Status (Y8657) (None) Motor Speech Goal Status (Q4696) (None) Motor Speech Goal Status (E9528) (None) Spoken Language Comprehension Current Status (U1324) (None) Spoken Language Comprehension Goal Status (M0102) (None) Spoken Language Comprehension Discharge Status (V2536) (None) Spoken Language Expression Current Status (U4403) (None) Spoken Language Expression Goal Status (K7425) (None) Spoken Language Expression Discharge Status [phone]) (None) Attention  Current Status (V5643) (None) Attention Goal Status (P2951) (None) Attention Discharge Status (O8416) (None) Memory Current Status (S0630) (None) Memory Goal Status (Z6010) (None) Memory Discharge Status (X3235) (None) Voice Current Status (T7322) (None) Voice Goal Status (G2542) (None) Voice Discharge Status (H0623) (None) Other Speech-Language Pathology Functional Limitation (J6283) (None) Other Speech-Language Pathology Functional Limitation Goal Status (T5176) (None) Other Speech-Language Pathology Functional Limitation Discharge Status [phone]) (None) Thank you, Havery Moros, CCC-SLP [phone] PORTER,DABNEY 11/29/2016, 8:32 PM               Assessment/Plan Failure to thrive-this continues to be a significant history as noted above again family conference as noted above was held with her daughter-at this point she indicates she would like aggressive care including maintaining full code status and most likely PEG tube placement for  nutrition-she would like to discuss  this with other family members however before proceeding.  At this point will monitor clinically patient does not appear to be in any distress but I feel with PEG tube placement is going to pursued this should be done quite expediently.  BMP is pending for tomorrow-last BMP on March 6 actually was fairly stable but I suspect this may due to. As her failure thrive continues.  [id number] note greater than 40 minutes spent assessing patient-discussing her status with nursing staff-discussing patient's status at family conference with her daughter and support staff-and coordinating plan of care

## 2016-12-13 ENCOUNTER — Encounter (HOSPITAL_COMMUNITY)
Admission: AD | Admit: 2016-12-13 | Discharge: 2016-12-13 | Disposition: A | Discharge status: Home / Self Care | Payer: Medicare HMO | Source: Skilled Nursing Facility | Attending: *Deleted | Admitting: *Deleted

## 2016-12-13 ENCOUNTER — Observation Stay (HOSPITAL_COMMUNITY)
Admission: EM | Admit: 2016-12-13 | Discharge: 2016-12-14 | Disposition: A | Discharge status: Home / Self Care | Payer: Medicare HMO | Attending: Internal Medicine | Admitting: Internal Medicine

## 2016-12-13 ENCOUNTER — Other Ambulatory Visit: Payer: Self-pay

## 2016-12-13 ENCOUNTER — Emergency Department (HOSPITAL_COMMUNITY): Payer: Medicare HMO

## 2016-12-13 ENCOUNTER — Encounter (HOSPITAL_COMMUNITY): Payer: Self-pay | Admitting: Cardiology

## 2016-12-13 DIAGNOSIS — R410 Disorientation, unspecified: Principal | ICD-10-CM

## 2016-12-13 DIAGNOSIS — L8990 Pressure ulcer of unspecified site, unspecified stage: Secondary | ICD-10-CM | POA: Insufficient documentation

## 2016-12-13 DIAGNOSIS — Z79899 Other long term (current) drug therapy: Secondary | ICD-10-CM | POA: Diagnosis not present

## 2016-12-13 DIAGNOSIS — R4182 Altered mental status, unspecified: Secondary | ICD-10-CM | POA: Diagnosis present

## 2016-12-13 DIAGNOSIS — I5031 Acute diastolic (congestive) heart failure: Secondary | ICD-10-CM | POA: Diagnosis not present

## 2016-12-13 DIAGNOSIS — R4020 Unspecified coma: Secondary | ICD-10-CM | POA: Diagnosis not present

## 2016-12-13 DIAGNOSIS — Z7189 Other specified counseling: Secondary | ICD-10-CM

## 2016-12-13 DIAGNOSIS — I110 Hypertensive heart disease with heart failure: Secondary | ICD-10-CM | POA: Diagnosis not present

## 2016-12-13 DIAGNOSIS — I10 Essential (primary) hypertension: Secondary | ICD-10-CM | POA: Diagnosis present

## 2016-12-13 DIAGNOSIS — I4891 Unspecified atrial fibrillation: Secondary | ICD-10-CM | POA: Diagnosis present

## 2016-12-13 DIAGNOSIS — Z515 Encounter for palliative care: Secondary | ICD-10-CM

## 2016-12-13 DIAGNOSIS — E039 Hypothyroidism, unspecified: Secondary | ICD-10-CM | POA: Insufficient documentation

## 2016-12-13 LAB — COMPREHENSIVE METABOLIC PANEL
ALT: 15 U/L (ref 14–54)
ANION GAP: 5 (ref 5–15)
AST: 23 U/L (ref 15–41)
Albumin: 1.9 g/dL — ABNORMAL LOW (ref 3.5–5.0)
Alkaline Phosphatase: 50 U/L (ref 38–126)
BILIRUBIN TOTAL: 0.6 mg/dL (ref 0.3–1.2)
BUN: 21 mg/dL — ABNORMAL HIGH (ref 6–20)
CO2: 38 mmol/L — ABNORMAL HIGH (ref 22–32)
Calcium: 8.1 mg/dL — ABNORMAL LOW (ref 8.9–10.3)
Chloride: 100 mmol/L — ABNORMAL LOW (ref 101–111)
Creatinine, Ser: 0.65 mg/dL (ref 0.44–1.00)
GFR calc Af Amer: >60 mL/min (ref >60)
Glucose, Bld: 108 mg/dL — ABNORMAL HIGH (ref 65–99)
POTASSIUM: 3.4 mmol/L — AB (ref 3.5–5.1)
Sodium: 143 mmol/L (ref 135–145)
TOTAL PROTEIN: 6.6 g/dL (ref 6.5–8.1)

## 2016-12-13 LAB — URINALYSIS, ROUTINE W REFLEX MICROSCOPIC
Glucose, UA: NEGATIVE mg/dL
Ketones, ur: NEGATIVE mg/dL
Leukocytes, UA: NEGATIVE
NITRITE: NEGATIVE
Protein, ur: NEGATIVE mg/dL
Specific Gravity, Urine: 1.025 (ref 1.005–1.030)
pH: 5.5 (ref 5.0–8.0)

## 2016-12-13 LAB — CBC WITH DIFFERENTIAL/PLATELET
BASOS ABS: 0 10*3/uL (ref 0.0–0.1)
Basophils Relative: 0 %
EOS PCT: 1 %
Eosinophils Absolute: 0.1 10*3/uL (ref 0.0–0.7)
HEMATOCRIT: 39.7 % (ref 36.0–46.0)
HEMOGLOBIN: 12.4 g/dL (ref 12.0–15.0)
LYMPHS PCT: 15 %
Lymphs Abs: 1.6 10*3/uL (ref 0.7–4.0)
MCH: 29.8 pg (ref 26.0–34.0)
MCHC: 31.2 g/dL (ref 30.0–36.0)
MCV: 95.4 fL (ref 78.0–100.0)
Monocytes Absolute: 0.5 10*3/uL (ref 0.1–1.0)
Monocytes Relative: 5 %
NEUTROS ABS: 8.8 10*3/uL — AB (ref 1.7–7.7)
NEUTROS PCT: 79 %
PLATELETS: 321 10*3/uL (ref 150–400)
RBC: 4.16 MIL/uL (ref 3.87–5.11)
RDW: 15.2 % (ref 11.5–15.5)
WBC: 11.1 10*3/uL — AB (ref 4.0–10.5)

## 2016-12-13 LAB — BASIC METABOLIC PANEL
Anion gap: 8 (ref 5–15)
BUN: 21 mg/dL — ABNORMAL HIGH (ref 6–20)
CALCIUM: 7.9 mg/dL — AB (ref 8.9–10.3)
CO2: 32 mmol/L (ref 22–32)
CREATININE: 0.59 mg/dL (ref 0.44–1.00)
Chloride: 102 mmol/L (ref 101–111)
GLUCOSE: 88 mg/dL (ref 65–99)
Potassium: 3.5 mmol/L (ref 3.5–5.1)
Sodium: 142 mmol/L (ref 135–145)

## 2016-12-13 LAB — URINALYSIS, MICROSCOPIC (REFLEX)

## 2016-12-13 LAB — I-STAT CG4 LACTIC ACID, ED: Lactic Acid, Venous: 2.29 mmol/L (ref 0.5–1.9)

## 2016-12-13 LAB — MRSA PCR SCREENING: MRSA BY PCR: NEGATIVE

## 2016-12-13 MED ORDER — MORPHINE SULFATE (PF) 2 MG/ML IV SOLN: 2.0000 mg | INTRAVENOUS | Status: DC | PRN

## 2016-12-13 MED ORDER — ACETAMINOPHEN 325 MG PO TABS: 650.0000 mg | ORAL_TABLET | Freq: Four times a day (QID) | ORAL | Status: DC | PRN

## 2016-12-13 MED ORDER — SODIUM CHLORIDE 1 G PO TABS
1.0000 g | ORAL_TABLET | Freq: Two times a day (BID) | ORAL | Status: DC
  Filled 2016-12-13 (×7): qty 1

## 2016-12-13 MED ORDER — METOPROLOL TARTRATE 50 MG PO TABS
50.0000 mg | ORAL_TABLET | Freq: Three times a day (TID) | ORAL | Status: DC
  Filled 2016-12-13: qty 1

## 2016-12-13 MED ORDER — AMIODARONE HCL 200 MG PO TABS
200.0000 mg | ORAL_TABLET | Freq: Two times a day (BID) | ORAL | Status: DC
  Filled 2016-12-13: qty 1

## 2016-12-13 MED ORDER — LEVOTHYROXINE SODIUM 50 MCG PO TABS
50.0000 ug | ORAL_TABLET | Freq: Three times a day (TID) | ORAL | Status: DC
  Filled 2016-12-13: qty 1

## 2016-12-13 MED ORDER — IPRATROPIUM-ALBUTEROL 0.5-2.5 (3) MG/3ML IN SOLN: 3.0000 mL | Freq: Four times a day (QID) | RESPIRATORY_TRACT | Status: DC | PRN

## 2016-12-13 MED ORDER — ORAL CARE MOUTH RINSE
15.0000 mL | Freq: Two times a day (BID) | OROMUCOSAL | Status: DC
  Administered 2016-12-13: 15 mL via OROMUCOSAL

## 2016-12-13 MED ORDER — POLYETHYLENE GLYCOL 3350 17 G PO PACK: 17.0000 g | PACK | Freq: Every day | ORAL | Status: DC | PRN

## 2016-12-13 NOTE — ED Triage Notes (Signed)
[facility]  Per staff pt has had decreased responsiveness over the past few days.  States pt is not eating or talking.

## 2016-12-13 NOTE — H&P (Signed)
History and Physical    Neko Mcgeehan ZOX:[id number] DOB: 02-Aug-1929 DOA: 12/13/2016  PCP: Abran Richard, PA-C  Patient coming from: SNF    Chief Complaint:  Altered mental status, weakness, failure to thrive.   HPI: Yvonne Chan is an 83 y.o. female with recurrent admissions, hx of afib on Eliquis, diastolic CHF, hyponatremia, influenza, several hospitalizations, brought to the ER from [facility] for altered mental status.  CXR showed persistent airspace disease,  Lactic of 2.29, and chemistry was unremarkable.  CBC showed WBC of 11K, and Hb os 12.4.  EDP spoke with her daughter at her bedside, and decision for her plan of care was to be admitted to hospice.  Her son and daughter at bedside would like to have her DNR, evaluated for hospice care tomorrow, and desires no further diagnostic testing or labs.  Hospitalist was asked to admit her for same.   ED Course:  See above.  Rewiew of Systems: Unable.   Past Medical History:  Diagnosis Date  . Atrial fibrillation (HCC)   . Essential hypertension   . Hypothyroidism   . Lumbago   . Neuralgia   . Neuritis     Past Surgical History:  Procedure Laterality Date  . ABDOMINAL HYSTERECTOMY    . APPENDECTOMY       reports that she has never smoked. She has never used smokeless tobacco. She reports that she does not drink alcohol or use drugs.  No Known Allergies  History reviewed. No pertinent family history.   Prior to Admission medications   Medication Sig Start Date End Date Taking? Authorizing Provider  acetaminophen (TYLENOL) 325 MG tablet Take 650 mg by mouth every 6 (six) hours as needed for moderate pain.    Yes Historical Provider, MD  amiodarone (PACERONE) 200 MG tablet Take 1 po BID x 1 week, then 1 po daily Patient taking differently: Take 200 mg by mouth 2 (two) times daily. Take 1 po BID x 1 week, then 1 po daily 12/06/16  Yes Clanford Cyndie Mull, MD  apixaban (ELIQUIS) 5 MG TABS tablet Take 1 tablet (5 mg total) by mouth  2 (two) times daily. 11/06/16  Yes Estela Isaiah Blakes, MD  Balsam [place]-Castor Oil [facility]) OINT Apply 1 application topically 3 (three) times daily. Apply to sacrum and bilateral buttocks every shift and as needed.   Yes Historical Provider, MD  ipratropium-albuterol (DUONEB) 0.5-2.5 (3) MG/3ML SOLN Take 3 mLs by nebulization every 6 (six) hours as needed (shortness of breath).   Yes Historical Provider, MD  levothyroxine (SYNTHROID, LEVOTHROID) 50 MCG tablet Take 50 mcg by mouth 3 (three) times daily.    Yes Historical Provider, MD  metoprolol (LOPRESSOR) 50 MG tablet Take 50 mg by mouth 3 (three) times daily.   Yes Historical Provider, MD  polyethylene glycol (MIRALAX / GLYCOLAX) packet Take 17 g by mouth daily as needed for moderate constipation. 12/06/16  Yes Clanford Cyndie Mull, MD  sodium chloride 1 g tablet Take 1 tablet (1 g total) by mouth 2 (two) times daily with a meal. 11/16/16  Yes Filbert Schilder, MD  OXYGEN Inhale 2 L into the lungs daily as needed. To keep sats greater or equal to 90%    Historical Provider, MD    Physical Exam: Vitals:   12/13/16 1430 12/13/16 1500 12/13/16 1600 12/13/16 1730  BP: 138/92 135/85 139/91 131/76  Pulse: 84     Resp: 25 26 21 25   Temp:      TempSrc:  SpO2: 100%     Weight:      Height:          Constitutional: NAD, calm, comfortable Vitals:   12/13/16 1430 12/13/16 1500 12/13/16 1600 12/13/16 1730  BP: 138/92 135/85 139/91 131/76  Pulse: 84     Resp: 25 26 21 25   Temp:      TempSrc:      SpO2: 100%     Weight:      Height:       Eyes: PERRL, lids and conjunctivae normal ENMT: Mucous membranes are moist. Posterior pharynx clear of any exudate or lesions.Normal dentition.  Neck: normal, supple, no masses, no thyromegaly Respiratory: clear to auscultation bilaterally, no wheezing, no crackles. Normal respiratory effort. No accessory muscle use.  Cardiovascular: Regular rate and rhythm, no murmurs / rubs / gallops. No  extremity edema. 2+ pedal pulses. No carotid bruits.  Abdomen: no tenderness, no masses palpated. No hepatosplenomegaly. Bowel sounds positive.  Musculoskeletal: no clubbing / cyanosis. No joint deformity upper and lower extremities. Good ROM, no contractures. Normal muscle tone.  Skin: no rashes, lesions, ulcers. No induration Neurologic: CN 2-12 grossly intact. Sensation intact, DTR normal. Strength 5/5 in all 4.  Psychiatric: Normal judgment and insight. Alert and oriented x 3. Normal mood.     Labs on Admission: I have personally reviewed following labs and imaging studies  CBC:  Recent Labs Lab 12/07/16 1128 12/08/16 0758 12/11/16 0700 12/12/16 0700 12/13/16 1256  WBC 13.1* 10.0 10.9* 10.3 11.1*  NEUTROABS 11.0* 7.4 8.4* 7.4 8.8*  HGB 13.5 15.3* 13.6 12.2 12.4  HCT 41.8 46.8* 42.3 38.8 39.7  MCV 91.7 92.7 94.0 93.9 95.4  PLT 361 268 326 328 321   Basic Metabolic Panel:  Recent Labs Lab 12/07/16 1128 12/08/16 0758 12/11/16 0700 12/13/16 0700 12/13/16 1256  NA 133* 133* 141 142 143  K 3.8 4.2 3.5 3.5 3.4*  CL 91* 92* 97* 102 100*  CO2 36* 33* 36* 32 38*  GLUCOSE 126* 95 112* 88 108*  BUN 19 19 21* 21* 21*  CREATININE 0.55 0.57 0.56 0.59 0.65  CALCIUM 7.7* 8.0* 8.2* 7.9* 8.1*   GFR: Estimated Creatinine Clearance: 66.3 mL/min (by C-G formula based on SCr of 0.65 mg/dL). Liver Function Tests:  Recent Labs Lab 12/07/16 1128 12/11/16 0700 12/13/16 1256  AST 26 20 23   ALT 13* 13* 15  ALKPHOS 51 53 50  BILITOT 0.8 0.7 0.6  PROT 6.0* 6.2* 6.6  ALBUMIN 1.6* 1.9* 1.9*    Recent Labs Lab 12/07/16 1136  AMMONIA 21   Coagulation Profile:  Recent Labs Lab 12/07/16 1128  INR 2.06   Cardiac Enzymes:  Recent Labs Lab 12/07/16 1128  TROPONINI <0.03   CBG:  Recent Labs Lab 12/07/16 1113  GLUCAP 115*   Urine analysis:    Component Value Date/Time   COLORURINE YELLOW 12/13/2016 1209   APPEARANCEUR TURBID (A) 12/13/2016 1209   LABSPEC 1.025  12/13/2016 1209   PHURINE 5.5 12/13/2016 1209   GLUCOSEU NEGATIVE 12/13/2016 1209   HGBUR MODERATE (A) 12/13/2016 1209   BILIRUBINUR SMALL (A) 12/13/2016 1209   KETONESUR NEGATIVE 12/13/2016 1209   PROTEINUR NEGATIVE 12/13/2016 1209   NITRITE NEGATIVE 12/13/2016 1209   LEUKOCYTESUR NEGATIVE 12/13/2016 1209    ) Recent Results (from the past 240 hour(s))  Urine culture     Status: None   Collection Time: 12/07/16 11:29 AM  Result Value Ref Range Status   Specimen Description URINE, CATHETERIZED  Final  Special Requests NONE  Final   Culture   Final    NO GROWTH Performed at [facility], [address] [place], [place] [address]    Report Status 12/09/2016 FINAL  Final  Blood Cultures (routine x 2)     Status: None   Collection Time: 12/07/16 11:36 AM  Result Value Ref Range Status   Specimen Description BLOOD LEFT WRIST  Final   Special Requests BOTTLES DRAWN AEROBIC AND ANAEROBIC [facility]6CC EACH  Final   Culture NO GROWTH 5 DAYS  Final   Report Status 12/12/2016 FINAL  Final  Blood Cultures (routine x 2)     Status: None   Collection Time: 12/07/16 11:36 AM  Result Value Ref Range Status   Specimen Description BLOOD LEFT HAND  Final   Special Requests BOTTLES DRAWN AEROBIC AND ANAEROBIC 5CC EACH  Final   Culture NO GROWTH 5 DAYS  Final   Report Status 12/12/2016 FINAL  Final     Radiological Exams on Admission: Dg Chest Port 1 View  Result Date: 12/13/2016 CLINICAL DATA:  Altered mental status and EXAM: PORTABLE CHEST 1 VIEW COMPARISON:  11/28/2016 FINDINGS: Mild bibasilar airspace disease unchanged from the prior study. Small left effusion unchanged. Negative for edema. No new findings. Trachea deviated to the right, possible left-sided goiter. IMPRESSION: Bibasilar airspace disease left greater than right is unchanged. Small left effusion unchanged. Electronically Signed   By: Marlan Palauharles  Clark M.D.   On: 12/13/2016 12:55    EKG: Independently reviewed.    Assessment/Plan Principal Problem:   Altered mental status Active Problems:   HTN (hypertension)   Hypothyroidism   Acute diastolic CHF (congestive heart failure) (HCC)   A-fib (HCC)    PLAN:   Altered mental status:   Patient is having failure to thrive, and is admitted for hospice care consultation and dispostion tomorrow.  Family would like no further diagnostic test or labs.  Will give PRN IV Morphine.  Will continue her oral medications as prescribed, except the Eliquis.  Family agreed that it should be stopped.   No IVF, and will consult hospice and palliative care in the morning.    She is comfortable, and is not actively dying at this time.   DVT prophylaxis: None.  Code Status: DNR, hospice care.  Family Communication: daughter and son at bedside.  She only has 2 children.  Disposition Plan: to hospice home. Consults called: EDP to hospice service.  Admission status: OBS>    Gladyce Mcray MD FACP. [facility]  If 7PM-7AM, please contact night-coverage [email or web address] Password [email or web address]  12/13/2016, 5:37 PM

## 2016-12-13 NOTE — ED Provider Notes (Signed)
[facility]-EMERGENCY DEPT Provider Note   CSN: [id number] Arrival date & time: 12/13/16  1150  By signing my name below, I, Bobbie Stackhristopher Reid, attest that this documentation has been prepared under the direction and in the presence of Gerhard Munchobert Tevin Shillingford, MD. Electronically Signed: Bobbie Stackhristopher Reid, Scribe. 12/13/16. 12:34 PM. History   Chief Complaint Chief Complaint  Patient presents with  . Altered Mental Status   LEVEL 5 CAVEAT: Altered Mental Status The history is provided by a relative. No language interpreter was used.  HPI Comments: Yvonne Chan is a 83 y.o. female who presents to the Emergency Department with altered mental status for the past few days. [facility] The patient is noted to not be as interactive as usual. The patient is typically able to interact with other people on a normal basis. The patient is also not eating or talking. Per family: The patient was very fatigued 3 days ago. She was only interacting with people if they spoke to her directly. She denies fever, vomiting, and nausea. They deny any recent injuries. She has been seen recently for the same problem.  Past Medical History:  Diagnosis Date  . Atrial fibrillation (HCC)   . Essential hypertension   . Hypothyroidism   . Lumbago   . Neuralgia   . Neuritis     Patient Active Problem List   Diagnosis Date Noted  . A-fib (HCC) 11/28/2016  . Swelling of right upper extremity 11/28/2016  . Influenza 11/17/2016  . Hyponatremia 11/07/2016  . Hyperglycemia 11/07/2016  . Weakness 11/07/2016  . Atrial fibrillation with RVR (HCC) 11/01/2016  . HTN (hypertension) 11/01/2016  . Hypothyroidism 11/01/2016  . Acute diastolic CHF (congestive heart failure) (HCC) 11/01/2016    Past Surgical History:  Procedure Laterality Date  . ABDOMINAL HYSTERECTOMY    . APPENDECTOMY      OB History    Gravida Para Term Preterm AB Living   10         5   SAB TAB Ectopic Multiple Live Births                 Home Medications    Prior to Admission medications   Medication Sig Start Date End Date Taking? Authorizing Provider  acetaminophen (TYLENOL) 325 MG tablet Take 650 mg by mouth every 6 (six) hours as needed.    Historical Provider, MD  amiodarone (PACERONE) 200 MG tablet Take 1 po BID x 1 week, then 1 po daily 12/06/16   Clanford Cyndie MullL Johnson, MD  apixaban (ELIQUIS) 5 MG TABS tablet Take 1 tablet (5 mg total) by mouth 2 (two) times daily. 11/06/16   Estela Isaiah BlakesY Hernandez Acosta, MD  Balsam [place]-Castor Oil [facility](VENELEX) OINT Apply 1 application topically 3 (three) times daily. Apply to sacrum and bilateral buttocks every shift and as needed.    Historical Provider, MD  ipratropium-albuterol (DUONEB) 0.5-2.5 (3) MG/3ML SOLN Take 3 mLs by nebulization every 6 (six) hours as needed (shortness of breath).    Historical Provider, MD  levothyroxine (SYNTHROID, LEVOTHROID) 50 MCG tablet Take 50 mcg by mouth daily before breakfast.    Historical Provider, MD  metoprolol (LOPRESSOR) 50 MG tablet Take 50 mg by mouth 3 (three) times daily.    Historical Provider, MD  OXYGEN Inhale 2 L into the lungs daily as needed. To keep sats greater or equal to 90%    Historical Provider, MD  polyethylene glycol (MIRALAX / GLYCOLAX) packet Take 17 g by mouth daily  as needed for moderate constipation. 12/06/16   Clanford Cyndie Mull, MD  sodium chloride 1 g tablet Take 1 tablet (1 g total) by mouth 2 (two) times daily with a meal. 11/16/16   Filbert Schilder, MD    Family History History reviewed. No pertinent family history.  Social History Social History  Substance Use Topics  . Smoking status: Never Smoker  . Smokeless tobacco: Never Used  . Alcohol use No     Allergies   Patient has no known allergies.   Review of Systems Review of Systems  Unable to perform ROS: Mental status change (Altered Mental Status)    Physical Exam Updated Vital Signs BP 109/77   Pulse 102   Temp 98.7 F (37.1 C)  (Rectal)   Resp (!) 30   Ht 5\' 8"  (1.727 m)   Wt 238 lb 12.8 oz (108.3 kg)   SpO2 98%   BMI 36.31 kg/m   Physical Exam  Constitutional:  Obese, minimally interactive elderly female  HENT:  Head: Normocephalic and atraumatic.  Eyes: Right eye exhibits no discharge. Left eye exhibits no discharge.  Neck: No thyromegaly present.  Cardiovascular: Normal rate and regular rhythm.   Murmur heard. Pulmonary/Chest: Effort normal and breath sounds normal. No stridor.  Abdominal: Soft. She exhibits no distension. There is no tenderness.  Musculoskeletal:  Patient has bilateral lower extremity having in place, no other gross deformities  Neurological:  Listless, minimally interactive, minimally move any extremities  Skin: Skin is warm and dry.  Psychiatric:  Noninteractive  Nursing note and vitals reviewed.   Review notable for 3 prior ED visits within the past 2 weeks, with similar presentations.  ED Treatments / Results   DIAGNOSTIC STUDIES: Oxygen Saturation is 98% on RA, normal by my interpretation.    COORDINATION OF CARE: 12:28 PM Discussed treatment plan with pt at bedside and pt agreed to plan. I will check the patient's labs and UA.  Labs (all labs ordered are listed, but only abnormal results are displayed) Labs Reviewed  COMPREHENSIVE METABOLIC PANEL - Abnormal; Notable for the following:       Result Value   Potassium 3.4 (*)    Chloride 100 (*)    CO2 38 (*)    Glucose, Bld 108 (*)    BUN 21 (*)    Calcium 8.1 (*)    Albumin 1.9 (*)    All other components within normal limits  CBC WITH DIFFERENTIAL/PLATELET - Abnormal; Notable for the following:    WBC 11.1 (*)    Neutro Abs 8.8 (*)    All other components within normal limits  URINALYSIS, ROUTINE W REFLEX MICROSCOPIC - Abnormal; Notable for the following:    APPearance TURBID (*)    Hgb urine dipstick MODERATE (*)    Bilirubin Urine SMALL (*)    All other components within normal limits  URINALYSIS,  MICROSCOPIC (REFLEX) - Abnormal; Notable for the following:    Bacteria, UA MANY (*)    Squamous Epithelial / LPF 0-5 (*)    All other components within normal limits  I-STAT CG4 LACTIC ACID, ED - Abnormal; Notable for the following:    Lactic Acid, Venous 2.29 (*)    All other components within normal limits     Radiology Dg Chest Port 1 View  Result Date: 12/13/2016 CLINICAL DATA:  Altered mental status and EXAM: PORTABLE CHEST 1 VIEW COMPARISON:  11/28/2016 FINDINGS: Mild bibasilar airspace disease unchanged from the prior study. Small left effusion unchanged.  Negative for edema. No new findings. Trachea deviated to the right, possible left-sided goiter. IMPRESSION: Bibasilar airspace disease left greater than right is unchanged. Small left effusion unchanged. Electronically Signed   By: Marlan Palau M.D.   On: 12/13/2016 12:55    Procedures Procedures (including critical care time)    Initial Impression / Assessment and Plan / ED Course  I have reviewed the triage vital signs and the nursing notes.  Pertinent labs & imaging results that were available during my care of the patient were reviewed by me and considered in my medical decision making (see chart for details).  After the initial evaluation again after labs returned I had a very lengthy conversation with the son and daughter about goals of care. With this elderly female, minimally interactive at baseline, now even less interactive, with apparent progression of disease, discussed the utility of further interventions. In particular, family has previously been advised to consider gastric tube feeds for this patient as she has not demonstrated any Drive for oral intake. We discussed the small chance of this changing patient's course, additional risk of this intervention. We had a very good control long conversation about the benefits of hospice care. Subsequently I discussed the patient with our hospice colleagues, and the  patient will be admitted to this facility under the care of the hospitalist team, with a.m. hospice evaluation. Patient designated DO NOT RESUSCITATE. Family aware of no additional labs, comfort care only.   Final Clinical Impressions(s) / ED Diagnoses  Unresponsiveness   Gerhard Munch, MD 12/13/16 [phone]

## 2016-12-14 ENCOUNTER — Encounter (HOSPITAL_COMMUNITY): Payer: Self-pay | Admitting: Primary Care

## 2016-12-14 DIAGNOSIS — L8990 Pressure ulcer of unspecified site, unspecified stage: Secondary | ICD-10-CM | POA: Insufficient documentation

## 2016-12-14 DIAGNOSIS — I10 Essential (primary) hypertension: Secondary | ICD-10-CM

## 2016-12-14 DIAGNOSIS — Z7189 Other specified counseling: Secondary | ICD-10-CM | POA: Diagnosis not present

## 2016-12-14 DIAGNOSIS — Z515 Encounter for palliative care: Secondary | ICD-10-CM | POA: Diagnosis not present

## 2016-12-14 DIAGNOSIS — R400 Somnolence: Secondary | ICD-10-CM | POA: Diagnosis not present

## 2016-12-14 NOTE — Evaluation (Signed)
Clinical/Bedside Swallow Evaluation Patient Details  Name: Yvonne Chan MRN: [id number] Date of Birth: 06/30/1929  Today's Date: 12/14/2016 Time: SLP Start Time (ACUTE ONLY): 1026 SLP Stop Time (ACUTE ONLY): 1048 SLP Time Calculation (min) (ACUTE ONLY): 22 min  Past Medical History:  Past Medical History:  Diagnosis Date  . Atrial fibrillation (HCC)   . Essential hypertension   . Hypothyroidism   . Lumbago   . Neuralgia   . Neuritis    Past Surgical History:  Past Surgical History:  Procedure Laterality Date  . ABDOMINAL HYSTERECTOMY    . APPENDECTOMY     HPI:  Yvonne Chan is an 83 y.o. female with recurrent admissions, hx of afib on Eliquis, diastolic CHF, hyponatremia, influenza, several hospitalizations, brought to the ER from [facility] for altered mental status.  CXR showed persistent airspace disease,  Lactic of 2.29, and chemistry was unremarkable.  CBC showed WBC of 11K, and Hb os 12.4.  EDP spoke with her daughter at her bedside, and decision for her plan of care was to be admitted to hospice.  Her son and daughter at bedside would like to have her DNR, evaluated for hospice care tomorrow, and desires no further diagnostic testing or labs.  Hospitalist was asked to admit her for same.   Assessment / Plan / Recommendation Clinical Impression  SLP is familiar with pt's recent medical hx from skilled nursing setting. Pt was seen at bedside; She did open her eyes and attempt to verbally respond although verbalizations were unintelligible. SLP provided thorough oral care with tothettes noting dry dark secretions on hard palate, lingual surface and sub lingual sulcus. Pt was unable to follow commands to "open mouth" or "stick out tongue"; Pt presents with severe oral weakness and decreased strength. Pt was provided a 3 ice chips with poor oral awareness of bolus and a significantly delayed swallow trigger. D/t pt's alertness no liquid trials or puree' trials were provided at this time.  Palliative care is currently meeting with the family to discuss Hospice. Recommend NPO with the allowance of ice chips after oral care d/t continued lethargy significantly increasing aspiration risk pending Hospice consult. Anticipate recommend comfort feeds to be implemented on safest diet after Palliative consult. Recommend Oral care QID. SLP Visit Diagnosis: Dysphagia, oral phase (R13.11)    Aspiration Risk  Severe aspiration risk    Diet Recommendation NPO;Ice chips PRN after oral care        Other  Recommendations Recommended Consults:  (Palliative) Oral Care Recommendations: Oral care QID;Oral care prior to ice chip/H20   Follow up Recommendations        Frequency and Duration min 1 x/week  1 week       Prognosis Prognosis for Safe Diet Advancement: Guarded Barriers to Reach Goals: Cognitive deficits;Severity of deficits      Swallow Study   General Date of Onset: 12/13/16 HPI: Yvonne Chan is an 83 y.o. female with recurrent admissions, hx of afib on Eliquis, diastolic CHF, hyponatremia, influenza, several hospitalizations, brought to the ER from [facility] for altered mental status.  CXR showed persistent airspace disease,  Lactic of 2.29, and chemistry was unremarkable.  CBC showed WBC of 11K, and Hb os 12.4.  EDP spoke with her daughter at her bedside, and decision for her plan of care was to be admitted to hospice.  Her son and daughter at bedside would like to have her DNR, evaluated for hospice care tomorrow, and desires no further diagnostic testing or labs.  Hospitalist  was asked to admit her for same.  Type of Study: Bedside Swallow Evaluation Previous Swallow Assessment: BSE Diet Prior to this Study: NPO Temperature Spikes Noted: No Respiratory Status: Room air History of Recent Intubation: No Behavior/Cognition: Cooperative;Lethargic/Drowsy Oral Cavity Assessment: Dry;Dried secretions Oral Care Completed by SLP: Yes Oral Cavity - Dentition: Missing  dentition Vision: Impaired for self-feeding Self-Feeding Abilities: Total assist Patient Positioning: Upright in bed Baseline Vocal Quality: Low vocal intensity Volitional Cough: Cognitively unable to elicit Volitional Swallow: Unable to elicit    Oral/Motor/Sensory Function Overall Oral Motor/Sensory Function: Generalized oral weakness   Ice Chips Ice chips: Impaired Presentation: Spoon Oral Phase Impairments: Reduced labial seal;Reduced lingual movement/coordination;Poor awareness of bolus Oral Phase Functional Implications: Prolonged oral transit Pharyngeal Phase Impairments: Suspected delayed Swallow   Thin Liquid      Nectar Thick     Honey Thick     Puree     Solid   GO        Functional Assessment Tool Used: Clinical Judgement Functional Limitations: Swallowing Swallow Current Status (R6045([id number]): At least 60 percent but less than 80 percent impaired, limited or restricted Swallow Goal Status [phone]([id number]): At least 60 percent but less than 80 percent impaired, limited or restricted Swallow Discharge Status [phone]([id number]): At least 60 percent but less than 80 percent impaired, limited or restricted     Yvonne H. Romie LeveeYarbrough MA, CCC-SLP Speech Language Pathologist  Yvonne HaberAmelia H Chan 12/14/2016,11:01 AM

## 2016-12-14 NOTE — Care Management Obs Status (Signed)
MEDICARE OBSERVATION STATUS NOTIFICATION   Patient Details  Name: Yvonne BellingMary Eckard MRN: [id number] Date of Birth: [birth year]   Medicare Observation Status Notification Given:  Yes    Malcolm MetroChildress, Jalicia Roszak Demske, RN 12/14/2016, 12:58 PM

## 2016-12-14 NOTE — Clinical Social Work Note (Signed)
LCSW spoke with Palliative Care nurse, Yvonne Chan, who indicated that family had made the decision for patient to go to [facility].  LCSW spoke with patient's daughter, Mrs. Yvonne Chan, and son, Mr. Yvonne HumphreysWalker, and confirmed that they were agreeable for patient's clinical information to be sent to [facility] for admission referral. Both were agreeable.   LCSW sent clinicals to [facility].     Yvonne Chan, Yvonne ChinaHeather D, LCSW

## 2016-12-14 NOTE — Consult Note (Signed)
Consultation Note Date: 12/14/2016   Patient Name: Yvonne Chan  DOB: [birth year]  MRN: [id number]  Age / Sex: 83 y.o., female  PCP: Abran Richard, PA-C Referring Physician: Micael Hampshire Acost*  Reason for Consultation: Establishing goals of care, Hospice Evaluation and Psychosocial/spiritual support  HPI/Patient Profile: 83 y.o. female  with past medical history of A fib., Hypertension, hypothyroidism, influenza followed by pneumonia, 5 hospitalizations in the last several months admitted on 12/13/2016 with observations status for altered mental status, failure to thrive.   Clinical Assessment and Goals of Care: Yvonne Chan is resting quietly in bed. She is able to make, but not keep eye contact. She is able to whisper her name when I ask, but she does not look toward her children when I ask her who they are. She is unable to effectively communicate, unable to make a sentence. She shakes her head no when I ask if she has pain.  Present today at bedside is daughter [place] (main contact) and son Naydeen Speirs. We go to my office for a family conference. [place] and Dorene Sorrow share that Yvonne Chan has had a marked decline over the last several months. 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 Family states that they understand she is nearing end of life, and are requesting the benefits of hospice. I ask who suggested hospice was this Yvonne choice or physician suggestion. [place] states the emergency room doctor suggested hospice, but they have no experience with hospice. We talk about the benefits of hospice in detail, I share that hospice relies on the belief that each of [place] has the right to die pain free and with dignity and that our loved ones receive the necessary support that allows this. We talk about hospice home of [facility], focusing on comfort and dignity, no IV fluids, feeding if  she is able, but "let nature take its course".  With family's permission I talk about prognosis. Priro to meeting Yvonne Chan, speech therapy from [facility] shared that she had not been eating well for several weeks, family was considering PEG tube placement. [place] and Dorene Sorrow stated today that they understand a PEG tube will not change what's happening, that Yvonne Chan is nearing end-of-life. I share that only God knows, but I believe Yvonne Chan has weeks. [place] states that the emergency room doctor stated he felt like Yvonne Chan had only one week. I share that the hospice provider will share changes as they see them but, generally people sleep more, interact less, and eat less as they near end-of-life. Family states that they have seen this already. Family is requesting placement in [facility] hospice home if possible, they are also open to returning to [facility] under Medicaid pending with the benefits of hospice. I share that we will send Yvonne Chan information to hospice and contact [facility] for disposition. Conference with Selena Batten, RN at hospice of [facility]. Conferences with social workers here on site who are reaching out to social workers at [facility].  Healthcare power of attorney NEXT OF KIN - 5 adult children. Daughter Lonell Grandchild, and son Moe Brier are main decision-makers.   SUMMARY OF RECOMMENDATIONS   families desire is to focus on comfort and dignity at this point, the benefits of hospice were discussed in detail including hospice services inpatient at what worth, no IV fluids foods when Yvonne Chan is alert and able to take them. We discussed the concept of let nature take its course, family is in agreement that Yvonne Chan is at end-of-life.  Code Status/Advance Care Planning:  DNR  Symptom Management:   per hospitalist  Palliative Prophylaxis:   Aspiration and Turn Reposition  Additional Recommendations (Limitations, Scope, Preferences):  Minimize Medications,  Initiate Comfort Feeding, No IV Fluids, No Lab Draws and Comfort measures only  Psycho-social/Spiritual:   Desire for further Chaplaincy support:no  Additional Recommendations: Caregiving  Support/Resources and Education on Hospice  Prognosis:   < 4 weeks, would not be surprising based on functional decline, albumin 1.9, 5 hospitalizations and last 6 months, families desire to focus on comfort and dignity with the benefits of hospice.  Discharge Planning: Family is requesting hospice home of [facility] and what worth if Yvonne Chan qualifies. There are also agreeable to return to [facility] with the benefits of Medicaid and hospice, if [facility] is willing to take Medicaid pending status.      Primary Diagnoses: Present on Admission: . Altered mental status . HTN (hypertension) . Hypothyroidism . Acute diastolic CHF (congestive heart failure) (HCC) . A-fib [facility])   I have reviewed the medical record, interviewed the patient and family, and examined the patient. The following aspects are pertinent.  Past Medical History:  Diagnosis Date  . Atrial fibrillation (HCC)   . Essential hypertension   . Hypothyroidism   . Lumbago   . Neuralgia   . Neuritis    Social History   Social History  . Marital status: Widowed    Spouse name: N/A  . Number of children: N/A  . Years of education: N/A   Occupational History  . retired    Social History Main Topics  . Smoking status: Never Smoker  . Smokeless tobacco: Never Used  . Alcohol use No  . Drug use: No  . Sexual activity: No   Other Topics Concern  . None   Social History Narrative  . None   History reviewed. No pertinent family history. Scheduled Meds: . amiodarone  200 mg Oral BID  . levothyroxine  50 mcg Oral TID  . mouth rinse  15 mL Mouth Rinse BID  . metoprolol  50 mg Oral Q8H  . sodium chloride  1 g Oral BID WC   Continuous Infusions: PRN Meds:.acetaminophen, ipratropium-albuterol, morphine injection,  polyethylene glycol Medications Prior to Admission:  Prior to Admission medications   Medication Sig Start Date End Date Taking? Authorizing Provider  acetaminophen (TYLENOL) 325 MG tablet Take 650 mg by mouth every 6 (six) hours as needed for moderate pain.    Yes Historical Provider, MD  amiodarone (PACERONE) 200 MG tablet Take 1 po BID x 1 week, then 1 po daily Patient taking differently: Take 200 mg by mouth 2 (two) times daily. Take 1 po BID x 1 week, then 1 po daily 12/06/16  Yes Clanford Cyndie Mull, MD  apixaban (ELIQUIS) 5 MG TABS tablet Take 1 tablet (5 mg total) by mouth 2 (two) times daily. 11/06/16  Yes Estela Isaiah Blakes, MD  Balsam [place]-Castor Oil [facility]) OINT Apply 1  application topically 3 (three) times daily. Apply to sacrum and bilateral buttocks every shift and as needed.   Yes Historical Provider, MD  ipratropium-albuterol (DUONEB) 0.5-2.5 (3) MG/3ML SOLN Take 3 mLs by nebulization every 6 (six) hours as needed (shortness of breath).   Yes Historical Provider, MD  levothyroxine (SYNTHROID, LEVOTHROID) 50 MCG tablet Take 50 mcg by mouth 3 (three) times daily.    Yes Historical Provider, MD  metoprolol (LOPRESSOR) 50 MG tablet Take 50 mg by mouth 3 (three) times daily.   Yes Historical Provider, MD  polyethylene glycol (MIRALAX / GLYCOLAX) packet Take 17 g by mouth daily as needed for moderate constipation. 12/06/16  Yes Clanford Cyndie MullL Johnson, MD  sodium chloride 1 g tablet Take 1 tablet (1 g total) by mouth 2 (two) times daily with a meal. 11/16/16  Yes Filbert SchilderAlexandria U Kadolph, MD  OXYGEN Inhale 2 L into the lungs daily as needed. To keep sats greater or equal to 90%    Historical Provider, MD   No Known Allergies Review of Systems  Unable to perform ROS: Mental status change    Physical Exam  Constitutional: No distress.  Chronically ill appearing, frail  HENT:  Head: Normocephalic and atraumatic.  Cardiovascular: Normal rate and regular rhythm.   Pulmonary/Chest: Effort  normal. No respiratory distress.  Abdominal: Soft. She exhibits no distension.  Obese abdomen  Musculoskeletal:  Generalized muscle wasting, yet obese  Neurological:  Able to make eye contact, unable to make a complete sentence.  Whispers her name when I ask, unable to look at her children who are in the room when I ask.  Skin: Skin is warm and dry.  Nursing note and vitals reviewed.   Vital Signs: BP (!) 141/96 (BP Location: Left Arm)   Pulse 96   Temp 97.8 F (36.6 C) (Axillary)   Resp 15   Ht 5\' 8"  (1.727 m)   Wt 108.3 kg (238 lb 12.8 oz)   SpO2 99%   BMI 36.31 kg/m  Pain Assessment: PAINAD   Pain Score: 0-No pain   SpO2: SpO2: 99 % O2 Device:SpO2: 99 % O2 Flow Rate: .   IO: Intake/output summary: No intake or output data in the 24 hours ending 12/14/16 1127  LBM: Last BM Date:  (unknown) Baseline Weight: Weight: 108.3 kg (238 lb 12.8 oz) Most recent weight: Weight: 108.3 kg (238 lb 12.8 oz)     Palliative Assessment/Data:   Flowsheet Rows   Flowsheet Row Most Recent Value  Intake Tab  Referral Department  Hospitalist  Unit at Time of Referral  Med/Surg Unit  Palliative Care Primary Diagnosis  Other (Comment) [FTT]  Date Notified  12/13/16  Palliative Care Type  New Palliative care  Reason for referral  Clarify Goals of Care, Counsel Regarding Hospice  Date first seen by Palliative Care  12/14/16  # of days Palliative referral response time  1 Day(s)  Clinical Assessment  Palliative Performance Scale Score  20%  Pain Max last 24 hours  Not able to report  Pain Min Last 24 hours  Not able to report  Dyspnea Max Last 24 Hours  Not able to report  Dyspnea Min Last 24 hours  Not able to report  Psychosocial & Spiritual Assessment  Palliative Care Outcomes  Patient/Family meeting held?  Yes  Who was at the meeting?  Daughter [place], and son Dorene SorrowJerry  Palliative Care Outcomes  Changed to focus on comfort, Provided psychosocial or spiritual support, Clarified  goals of care, Counseled  regarding hospice  Patient/Family wishes: Interventions discontinued/not started   Mechanical Ventilation  Palliative Care follow-up planned  -- [Follow-up while at [facility]]      Time In: 1045 Time Out: 1135 Time Total: 50 minutes Greater than 50%  of this time was spent counseling and coordinating care related to the above assessment and plan.  Signed by: Katheran Awe, NP   Please contact Palliative Medicine Team phone at [phone] for questions and concerns.  For individual provider: See Loretha Stapler

## 2016-12-14 NOTE — Progress Notes (Addendum)
Report [facility] Pass[facility]Cindy [facility].  Transferred via EMS

## 2016-12-14 NOTE — Clinical Social Work Note (Signed)
Patient Information   Patient Name Yvonne Chan, Yvonne Chan ([id number]) Sex Female DOB 1929/01/31 SSN [id number]  Room Bed  A316 A316-01  Patient Demographics   Address [address] [place] [address] Phone [phone] (Home)  Patient Ethnicity & Race   Ethnic Group Patient Race  Not Hispanic or Latino Black or African American  Emergency Contact(s)   Name Relation Home Work [employer]  [place] Daughter [phone]  [phone]  Kristeen MansWalker,Jerry Son [phone]    Documents on File    Status Date Received Description  Documents for the Patient  [place] HIPAA NOTICE OF PRIVACY - Scanned Not Received    [facility] E-Signature HIPAA Notice of Privacy Signed 11/01/16   Driver's License Not Received    Insurance Card Not Received    Advance Directives/Living Will/HCPOA/POA Not Received    Other Photo ID Not Received    Documents for the Encounter  AOB (Assignment of Insurance Benefits) Not Received    E-signature AOB Signed 12/13/16   MEDICARE RIGHTS Not Received    E-signature Medicare Rights Signed 12/13/16   ED Patient Billing Extract   ED PB Summary  ED Patient Billing Extract   ED Encounter Summary  Admission Information   Attending Provider Admitting Provider Admission Type Admission Date/Time  Estela Isaiah BlakesY Hernandez Acosta, MD Houston SirenPeter Le, MD Emergency 12/13/16 1150  Discharge Date Hospital Service Auth/Cert Status Service Area   Internal Medicine Incomplete [place] SERVICE AREA  Unit Room/Bed Admission Status   AP-DEPT 300 A316/A316-01 Admission (Confirmed)   Admission   Complaint  [facility]  Hospital Account   Name Acct ID Class Status Primary Coverage  Yvonne Chan, Yvonne Chan [id number] Observation Open HUMANA MEDICARE - HUMANA MEDICARE HMO      Guarantor Account (for Hospital Account [id number])   Name Relation to Pt Service Area Active? Acct Type  Yvonne Chan, Yvonne Chan Self CHSA Yes Personal/Family  Address Phone    [address] [place], [place] [address] [phone])         Coverage Information (for Hospital Account [id number])   F/O Payor/Plan Precert #  [facility]/HUMANA MEDICARE HMO   Subscriber Subscriber #  Yvonne Chan, Yvonne Chan [id number]  Address Phone  [address] [address] [place], [place] [phone]

## 2016-12-14 NOTE — Care Management Note (Signed)
Case Management Note  Patient Details  Name: Romero BellingMary Belue MRN: [id number] Date of Birth: 01-24-1929  Subjective/Objective:                  [facility]. Family wishes for pt to go to Hospice facility. CSW and palliative following pt to make arrangements for place at hospice facility.   Action/Plan: No CM needs.   Expected Discharge Date:    12/14/2016              Expected Discharge Plan:  Hospice Medical Facility  In-House Referral:  Clinical Social Work, Hospice / Palliative Care  Discharge planning Services  CM Consult  Post Acute Care Choice:  NA Choice offered to:  NA  Status of Service:  Completed, signed off  Malcolm MetroChildress, Kallee Nam Demske, RN 12/14/2016, 12:58 PM

## 2016-12-14 NOTE — Discharge Summary (Signed)
Physician Discharge Summary  Yvonne Chan ZOX:[id number] DOB: March 28, 1929 DOA: 12/13/2016  PCP: Abran Richard, PA-C  Admit date: 12/13/2016 Discharge date: 12/14/2016  Time spent: 45 minutes  Recommendations for Outpatient Follow-up:  -Will be discharged to residential hospice today.   Discharge Diagnoses:  Principal Problem:   Altered mental status Active Problems:   HTN (hypertension)   Hypothyroidism   Acute diastolic CHF (congestive heart failure) (HCC)   A-fib (HCC)   Pressure injury of skin   Goals of care, counseling/discussion   DNR (do not resuscitate) discussion   Palliative care encounter   Discharge Condition: Yvonne Chan Weights   12/13/16 1156  Weight: 108.3 kg (238 lb 12.8 oz)    History of present illness:  As per Dr. Conley Rolls on 3/8: Yvonne Chan is an 83 y.o. female with recurrent admissions, hx of afib on Eliquis, diastolic CHF, hyponatremia, influenza, several hospitalizations, brought to the ER from [facility] for altered mental status.  CXR showed persistent airspace disease,  Lactic of 2.29, and chemistry was unremarkable.  CBC showed WBC of 11K, and Hb os 12.4.  EDP spoke with her daughter at her bedside, and decision for her plan of care was to be admitted to hospice.  Her son and daughter at bedside would like to have her DNR, evaluated for hospice care tomorrow, and desires no further diagnostic testing or labs.  Hospitalist was asked to admit her for same.    Hospital Course:   Toxic Metabolic Encephalopathy/FTT -Patient has continued to deteriorate. Family is interested in pursuing hospice care which I think is appropriate. I think her prognosis is weeks. -Residential hospice available today.  Procedures:  None   Consultations:  Palliiative care  Discharge Instructions  Discharge Instructions    Diet - low sodium heart healthy    Complete by:  As directed    Increase activity slowly    Complete by:  As directed      Allergies as  of 12/14/2016   No Known Allergies     Medication List    STOP taking these medications   apixaban 5 MG Tabs tablet Commonly known as:  ELIQUIS   levothyroxine 50 MCG tablet Commonly known as:  SYNTHROID, LEVOTHROID   metoprolol 50 MG tablet Commonly known as:  LOPRESSOR   sodium chloride 1 g tablet     TAKE these medications   acetaminophen 325 MG tablet Commonly known as:  TYLENOL Take 650 mg by mouth every 6 (six) hours as needed for moderate pain.   amiodarone 200 MG tablet Commonly known as:  PACERONE Take 1 po BID x 1 week, then 1 po daily What changed:  how much to take  how to take this  when to take this  additional instructions   ipratropium-albuterol 0.5-2.5 (3) MG/3ML Soln Commonly known as:  DUONEB Take 3 mLs by nebulization every 6 (six) hours as needed (shortness of breath).   OXYGEN Inhale 2 L into the lungs daily as needed. To keep sats greater or equal to 90%   polyethylene glycol packet Commonly known as:  MIRALAX / GLYCOLAX Take 17 g by mouth daily as needed for moderate constipation.   VENELEX Oint Apply 1 application topically 3 (three) times daily. Apply to sacrum and bilateral buttocks every shift and as needed.      No Known Allergies    The results of significant diagnostics from this hospitalization (including imaging, microbiology, ancillary and laboratory) are listed below for reference.  Significant Diagnostic Studies: Dg Chest 2 View  Result Date: 11/17/2016 CLINICAL DATA:  Recent positive test for flu, shortness of breath EXAM: CHEST  2 VIEW COMPARISON:  11/10/2016 FINDINGS: Stable cardiomegaly and tiny effusions. No interval consolidation. No pneumothorax. Mild linear atelectasis in the left upper lobe. IMPRESSION: Cardiomegaly with tiny effusions. No interval development of acute focal pulmonary infiltrate Electronically Signed   By: Jasmine Pang M.D.   On: 11/17/2016 01:39   Ct Head Wo Contrast  Result Date:  12/07/2016 CLINICAL DATA:  Acute presentation with altered mental status. EXAM: CT HEAD WITHOUT CONTRAST TECHNIQUE: Contiguous axial images were obtained from the base of the skull through the vertex without intravenous contrast. COMPARISON:  None. FINDINGS: Brain: Mild generalized age related atrophy. No focal brainstem or cerebellar finding. Old small vessel infarctions in the thalami I, basal ganglia and cerebral hemispheric white matter. No large vessel territory infarction. No sign of acute insult. No mass lesion, hemorrhage, hydrocephalus or extra-axial collection. Vascular: There is atherosclerotic calcification of the major vessels at the base of the brain. Skull: Negative Sinuses/Orbits: Clear/normal Other: None IMPRESSION: Age related atrophy. Mild chronic small-vessel ischemic changes as outlined above. No acute or reversible finding. Electronically Signed   By: Paulina Fusi M.D.   On: 12/07/2016 13:52   US Venous Img Upper Uni Right  Result Date: 11/29/2016 CLINICAL DATA:  Right arm swelling EXAM: Right UPPER EXTREMITY VENOUS DOPPLER ULTRASOUND TECHNIQUE: Gray-scale sonography with graded compression, as well as color Doppler and duplex ultrasound were performed to evaluate the upper extremity deep venous system from the level of the subclavian vein and including the jugular, axillary, basilic, radial, ulnar and upper cephalic vein. Spectral Doppler was utilized to evaluate flow at rest and with distal augmentation maneuvers. COMPARISON:  None. FINDINGS: Contralateral Subclavian Vein: Respiratory phasicity is normal and symmetric with the symptomatic side. No evidence of thrombus. Normal compressibility. Internal Jugular Vein: No evidence of thrombus. Normal compressibility, respiratory phasicity and response to augmentation. Subclavian Vein: No evidence of thrombus. Normal compressibility, respiratory phasicity and response to augmentation. Axillary Vein: No evidence of thrombus. Normal  compressibility, respiratory phasicity and response to augmentation. Cephalic Vein: No evidence of thrombus. Normal compressibility, respiratory phasicity and response to augmentation. Basilic Vein: No evidence of thrombus. Normal compressibility, respiratory phasicity and response to augmentation. Brachial Veins: No evidence of thrombus. Normal compressibility, respiratory phasicity and response to augmentation. Radial Veins: No evidence of thrombus. Normal compressibility, respiratory phasicity and response to augmentation. Ulnar Veins: No evidence of thrombus. Normal compressibility, respiratory phasicity and response to augmentation. Venous Reflux:  None visualized. Other Findings:  None visualized. IMPRESSION: No evidence of deep venous thrombosis. Electronically Signed   By: Marlan Palau M.D.   On: 11/29/2016 11:58   Dg Chest Port 1 View  Result Date: 12/13/2016 CLINICAL DATA:  Altered mental status and EXAM: PORTABLE CHEST 1 VIEW COMPARISON:  11/28/2016 FINDINGS: Mild bibasilar airspace disease unchanged from the prior study. Small left effusion unchanged. Negative for edema. No new findings. Trachea deviated to the right, possible left-sided goiter. IMPRESSION: Bibasilar airspace disease left greater than right is unchanged. Small left effusion unchanged. Electronically Signed   By: Marlan Palau M.D.   On: 12/13/2016 12:55   Dg Chest Port 1 View  Result Date: 11/28/2016 CLINICAL DATA:  Acute onset of shortness of breath and tachycardia. Right-sided soft tissue edema. Initial encounter. EXAM: PORTABLE CHEST 1 VIEW COMPARISON:  Chest radiograph performed 11/17/2016 FINDINGS: The lungs are well-aerated. Left basilar opacity may  reflect a combination of airspace opacification and small left pleural effusion. Underlying vascular congestion is noted. No pneumothorax is seen. Right paratracheal prominence is again noted. If not previously assessed, CT of the chest could be considered for further  evaluation on an elective nonemergent basis. The heart is mildly enlarged. No acute osseous abnormalities are seen. IMPRESSION: 1. Left basilar opacity may reflect a combination of airspace opacification and small left pleural effusion. Pneumonia or possibly mild asymmetric interstitial edema could have such an appearance. 2. Underlying vascular congestion and mild cardiomegaly. 3. Right paratracheal prominence again noted. If not previously assessed, CT of the chest could be considered for further evaluation on an elective nonemergent basis. Electronically Signed   By: Roanna Raider M.D.   On: 11/28/2016 16:35   Dg Swallowing Func-speech Pathology  Result Date: 11/29/2016 Objective Swallowing Evaluation: Type of Study: MBS-Modified Barium Swallow Study Patient Details Name: Yvonne Chan MRN: [id number] Date of Birth: 1929/02/20 Today's Date: 11/29/2016 Time: SLP Start Time (ACUTE ONLY): 1337-SLP Stop Time (ACUTE ONLY): 1405 SLP Time Calculation (min) (ACUTE ONLY): 28 min Past Medical History: Past Medical History: Diagnosis Date . Atrial fibrillation (HCC)  . Essential hypertension  . Hypothyroidism  . Lumbago  . Neuralgia  . Neuritis  Past Surgical History: Past Surgical History: Procedure Laterality Date . ABDOMINAL HYSTERECTOMY   . APPENDECTOMY   HPI: Prudie Guthridge a 83 y.o.femalewith medical history significant ofHTN, PAF on Eliquis, diastolic CHF, and hypothyroidism; who presents from the [facility] for a fastirregular heartbeat noticed this afternoon by staff. According to family present at bedside she has had increasing swelling in her right arms and bilateral legs. Patient complains of feeling short of breath. Review of records shows that the patient has beenhospitalized multiple times within the last month1/25-1/30, 1/31-2/9, and then 2/9-2/10. Treated for atrial fibrillation, diastolic CHF, influenza, and community-acquired pneumonia. Other associated symptoms include decreased appetite and  generalized weakness. Patient denies any chest pain, dysuria, nausea, vomiting, orabdominal pain. Review of records shows the patient weight back on 2/6was 224 pounds. Upon admission into the emergency department patient was seen to be afebrile, pulse 120-140s, respirations 20-34, blood pressure 96/71-131/80, and O2 saturations maintained at room air. Lab work revealed WBC 10.8, sodium 133, chloride 91, CO2 33, BUN 23, creatinine 0.71, calcium 8.1, glucose 135, troponin 0.03, andBNP 240. Due to the patient's elevated heart rates she was placed on a diltiazem drip. TRH called to admit. Recommended empiric antibiotics of vancomycin and Zosyn for possibility of a healthcare associated pneumonia. SLP ordered for BSE due to concerns about aspiration risk. Subjective: "Okay." Assessment / Plan / Recommendation CHL IP CLINICAL IMPRESSIONS 11/29/2016 Clinical Impression Pt presents with mild/mod oral phase, mild pharyngeal phase, and suspected primary esophageal phase dysphagia. Oral phase is marked by reduced lingual manipulation of bolus resulting in decreased bolus cohesiveness with decreased anterior posterior oral transit, premature spillage over base of tongue with liquids, piecemeal deglutition with liquid oral residues after initial/primary swallow. Swallow initiation is triggered after spilling/filling the pyriforms with cup/straw sips of thin and cup sips of NTL. Pt demonstrates adequate hyolaryngeal excursion, epiglottic deflection, and airway protection with no penetration or aspiration observed over course of study. Pt does have oral residuals after initial bolus/swallow of liquids which trickle to valleculae and pyriforms after the primary swallow which pt does not immediately sense, however these were never penetrated or aspirated when SLP observed without cuing her to swallow to clear. She does eventually spontaneously clear, however these residuals do pose  a risk for penetration/aspiration. These  residuals in pharynx can be cleared with caregiver cue to swallow two times for each sip of liquid. Esophageal sweep revealed stasis of barium/bolus in distal third of esophagus likely due to dysmotility. The barium tablet traversed esophagus and through LES without significant delay. Occasionally, backflow of bolus noted to move from cervical esophagus to UES, but was not noted to reflux back into pyriforms. Pt's greatest risks for aspiration appear related to liquid oral residuals pooling in pharynx after the primary swallow and also stasis in esophagus (threat of laryngopharyngeal reflux), however not observed today. Risks can be minimized by consuming soft textures (ok for mech soft however pt seems to prefer puree clinically) and thin liquids in upright position and remaining upright at least 30 minutes after meals with caregiver cues to swallow twice for each sip (whether cup or straw). Pt was assessed with nectar-thick liquids and found not to be better tolerated than thins. Results of MBSS reviewed with Pt's daughter. SLP Visit Diagnosis Dysphagia, oropharyngeal phase (R13.12);Dysphagia, pharyngeal phase (R13.13) Attention and concentration deficit following -- Frontal lobe and executive function deficit following -- Impact on safety and function Mild aspiration risk   CHL IP TREATMENT RECOMMENDATION 11/29/2016 Treatment Recommendations Therapy as outlined in treatment plan below   Prognosis 11/29/2016 Prognosis for Safe Diet Advancement Fair Barriers to Reach Goals Behavior Barriers/Prognosis Comment -- CHL IP DIET RECOMMENDATION 11/29/2016 SLP Diet Recommendations Dysphagia 2 (Fine chop) solids;Thin liquid Liquid Administration via Cup;Straw Medication Administration Whole meds with liquid Compensations Slow rate;Multiple dry swallows after each bite/sip Postural Changes Remain semi-upright after after feeds/meals (Comment);Seated upright at 90 degrees   CHL IP OTHER RECOMMENDATIONS 11/29/2016 Recommended  Consults -- Oral Care Recommendations Oral care BID;Staff/trained caregiver to provide oral care Other Recommendations Clarify dietary restrictions   CHL IP FOLLOW UP RECOMMENDATIONS 11/29/2016 Follow up Recommendations Skilled Nursing facility   [facility] IP FREQUENCY AND DURATION 11/29/2016 Speech Therapy Frequency (ACUTE ONLY) min 2x/week Treatment Duration 1 week      CHL IP ORAL PHASE 11/29/2016 Oral Phase Impaired Oral - Pudding Teaspoon -- Oral - Pudding Cup -- Oral - Honey Teaspoon -- Oral - Honey Cup -- Oral - Nectar Teaspoon -- Oral - Nectar Cup Reduced posterior propulsion;Lingual/palatal residue;Delayed oral transit Oral - Nectar Straw -- Oral - Thin Teaspoon -- Oral - Thin Cup Reduced posterior propulsion;Lingual/palatal residue;Piecemeal swallowing;Delayed oral transit;Premature spillage Oral - Thin Straw -- Oral - Puree WFL;Delayed oral transit Oral - Mech Soft Impaired mastication;Reduced posterior propulsion;Piecemeal swallowing;Delayed oral transit Oral - Regular -- Oral - Multi-Consistency -- Oral - Pill -- Oral Phase - Comment --  CHL IP PHARYNGEAL PHASE 11/29/2016 Pharyngeal Phase Impaired Pharyngeal- Pudding Teaspoon -- Pharyngeal -- Pharyngeal- Pudding Cup -- Pharyngeal -- Pharyngeal- Honey Teaspoon -- Pharyngeal -- Pharyngeal- Honey Cup -- Pharyngeal -- Pharyngeal- Nectar Teaspoon -- Pharyngeal -- Pharyngeal- Nectar Cup Pharyngeal residue - valleculae;Pharyngeal residue - pyriform;Delayed swallow initiation-pyriform sinuses Pharyngeal -- Pharyngeal- Nectar Straw -- Pharyngeal -- Pharyngeal- Thin Teaspoon Delayed swallow initiation-pyriform sinuses Pharyngeal -- Pharyngeal- Thin Cup Delayed swallow initiation-pyriform sinuses;Pharyngeal residue - valleculae;Pharyngeal residue - pyriform Pharyngeal -- Pharyngeal- Thin Straw Delayed swallow initiation-pyriform sinuses;Pharyngeal residue - valleculae;Pharyngeal residue - pyriform Pharyngeal -- Pharyngeal- Puree Delayed swallow initiation-vallecula  Pharyngeal -- Pharyngeal- Mechanical Soft Delayed swallow initiation-vallecula;Pharyngeal residue - cp segment Pharyngeal -- Pharyngeal- Regular -- Pharyngeal -- Pharyngeal- Multi-consistency -- Pharyngeal -- Pharyngeal- Pill -- Pharyngeal -- Pharyngeal Comment pharynx clear after primary bolus swallow, but oral residuals (liquids) pool into valleculae and  pyriforms after the swallow  CHL IP CERVICAL ESOPHAGEAL PHASE 11/29/2016 Cervical Esophageal Phase Impaired Pudding Teaspoon -- Pudding Cup -- Honey Teaspoon -- Honey Cup -- Nectar Teaspoon -- Nectar Cup -- Nectar Straw -- Thin Teaspoon -- Thin Cup -- Thin Straw -- Puree -- Mechanical Soft Esophageal backflow into cervical esophagus Regular -- Multi-consistency -- Pill -- Cervical Esophageal Comment mild retention/backflow into UES from cervical esophagus CHL IP GO 11/29/2016 Functional Assessment Tool Used clinical judgment Functional Limitations Swallowing Swallow Current Status (Z6109) CJ Swallow Goal Status (U0454) CI Swallow Discharge Status (U9811) (None) Motor Speech Current Status (B1478) (None) Motor Speech Goal Status (G9562) (None) Motor Speech Goal Status (Z3086) (None) Spoken Language Comprehension Current Status (V7846) (None) Spoken Language Comprehension Goal Status (N6295) (None) Spoken Language Comprehension Discharge Status [phone] (None) Spoken Language Expression Current Status (K4401) (None) Spoken Language Expression Goal Status (U2725) (None) Spoken Language Expression Discharge Status [phone]) (None) Attention Current Status (I3474) (None) Attention Goal Status (Q5956) (None) Attention Discharge Status (L8756) (None) Memory Current Status (E3329) (None) Memory Goal Status (J1884) (None) Memory Discharge Status (Z6606) (None) Voice Current Status (T0160) (None) Voice Goal Status (F0932) (None) Voice Discharge Status (T5573) (None) Other Speech-Language Pathology Functional Limitation [phone]) (None) Other Speech-Language Pathology Functional  Limitation Goal Status (K2706) (None) Other Speech-Language Pathology Functional Limitation Discharge Status [phone]) (None) Thank you, Havery Moros, CCC-SLP [phone] PORTER,DABNEY 11/29/2016, 8:32 PM               Microbiology: Recent Results (from the past 240 hour(s))  Urine culture     Status: None   Collection Time: 12/07/16 11:29 AM  Result Value Ref Range Status   Specimen Description URINE, CATHETERIZED  Final   Special Requests NONE  Final   Culture   Final    NO GROWTH Performed at [facility], [address] [place], [place] [address]    Report Status 12/09/2016 FINAL  Final  Blood Cultures (routine x 2)     Status: None   Collection Time: 12/07/16 11:36 AM  Result Value Ref Range Status   Specimen Description BLOOD LEFT WRIST  Final   Special Requests BOTTLES DRAWN AEROBIC AND ANAEROBIC 6CC EACH  Final   Culture NO GROWTH 5 DAYS  Final   Report Status 12/12/2016 FINAL  Final  Blood Cultures (routine x 2)     Status: None   Collection Time: 12/07/16 11:36 AM  Result Value Ref Range Status   Specimen Description BLOOD LEFT HAND  Final   Special Requests BOTTLES DRAWN AEROBIC AND ANAEROBIC 5CC EACH  Final   Culture NO GROWTH 5 DAYS  Final   Report Status 12/12/2016 FINAL  Final  MRSA PCR Screening     Status: None   Collection Time: 12/13/16  7:10 PM  Result Value Ref Range Status   MRSA by PCR NEGATIVE NEGATIVE Final    Comment:        The GeneXpert MRSA Assay (FDA approved for NASAL specimens only), is one component of a comprehensive MRSA colonization surveillance program. It is not intended to diagnose MRSA infection nor to guide or monitor treatment for MRSA infections.      Labs: Basic Metabolic Panel:  Recent Labs Lab 12/08/16 0758 12/11/16 0700 12/13/16 0700 12/13/16 1256  NA 133* 141 142 143  K 4.2 3.5 3.5 3.4*  CL 92* 97* 102 100*  CO2 33* 36* 32 38*  GLUCOSE 95 112* 88 108*  BUN 19 21* 21* 21*  CREATININE 0.57 0.56  0.59 0.65    CALCIUM 8.0* 8.2* 7.9* 8.1*   Liver Function Tests:  Recent Labs Lab 12/11/16 0700 12/13/16 1256  AST 20 23  ALT 13* 15  ALKPHOS 53 50  BILITOT 0.7 0.6  PROT 6.2* 6.6  ALBUMIN 1.9* 1.9*   No results for input(s): LIPASE, AMYLASE in the last 168 hours. No results for input(s): AMMONIA in the last 168 hours. CBC:  Recent Labs Lab 12/08/16 0758 12/11/16 0700 12/12/16 0700 12/13/16 1256  WBC 10.0 10.9* 10.3 11.1*  NEUTROABS 7.4 8.4* 7.4 8.8*  HGB 15.3* 13.6 12.2 12.4  HCT 46.8* 42.3 38.8 39.7  MCV 92.7 94.0 93.9 95.4  PLT 268 326 328 321   Cardiac Enzymes: No results for input(s): CKTOTAL, CKMB, CKMBINDEX, TROPONINI in the last 168 hours. BNP: BNP (last 3 results)  Recent Labs  11/17/16 0056 11/27/16 0730 11/28/16 1617  BNP 202.0* 258.0* 240.0*    ProBNP (last 3 results) No results for input(s): PROBNP in the last 8760 hours.  CBG: No results for input(s): GLUCAP in the last 168 hours.     SignedChaya Jan:  HERNANDEZ ACOSTA,ESTELA  [facility] Pager: [phone] 12/14/2016, 1:21 PM

## 2016-12-20 ENCOUNTER — Encounter: Payer: Medicare HMO | Admitting: Adult Health

## 2017-01-06 DEATH — deceased

## 2017-11-01 ENCOUNTER — Encounter: Payer: Self-pay | Admitting: Internal Medicine

## 2017-12-10 ENCOUNTER — Encounter: Payer: Self-pay | Admitting: Internal Medicine

## 2017-12-10 NOTE — Progress Notes (Signed)
This encounter was created in error - please disregard.

## 2018-05-10 IMAGING — RF DG SWALLOWING FUNCTION - NRPT [facility]
13 of 24 series · 13 of 24 positions shown · non-contrast
Comparison: none

[Series 1: run · 1 of 137 frames shown (1 of 6)]
[frame 21/137]
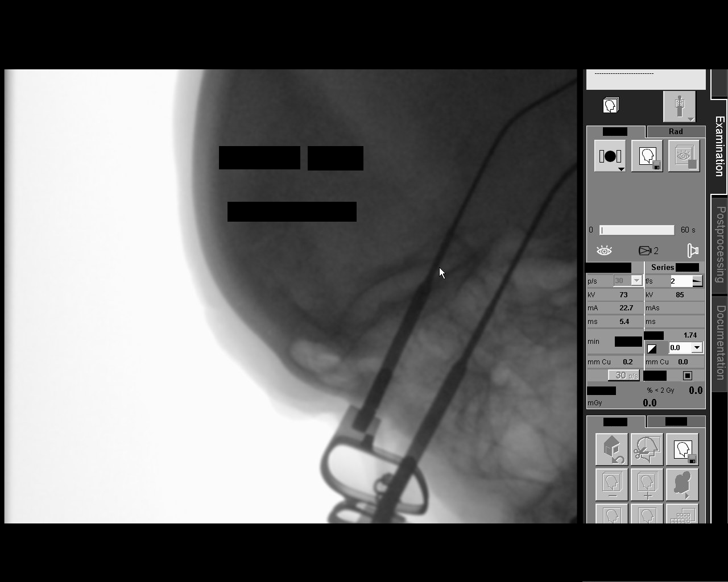

[Series 3: cup thin · 1 of 189 frames shown (1 of 2)]
[frame 29/189]
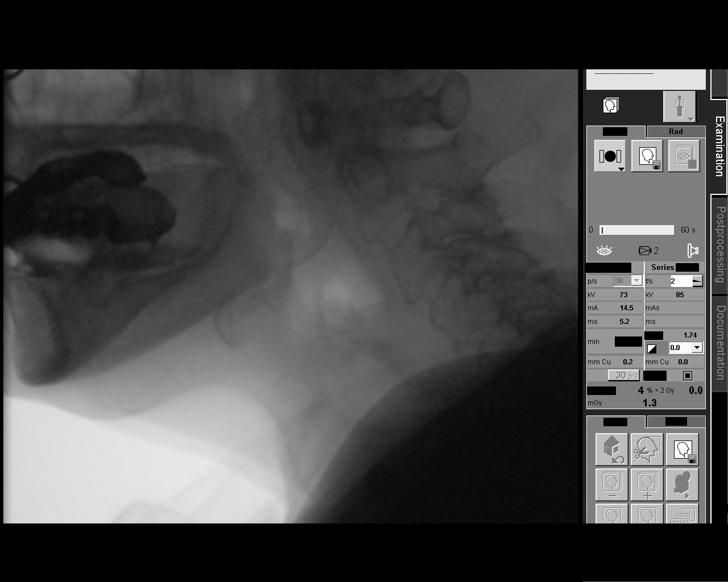

[Series 5: cup thin · 1 of 476 frames shown (2 of 2)]
[frame 66/476]
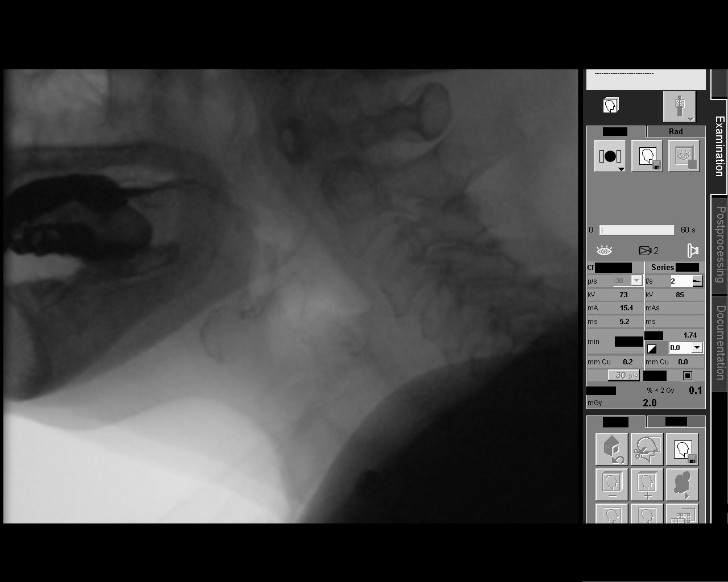

[Series 7: run · 1 of 87 frames shown (2 of 6)]
[frame 21/87]
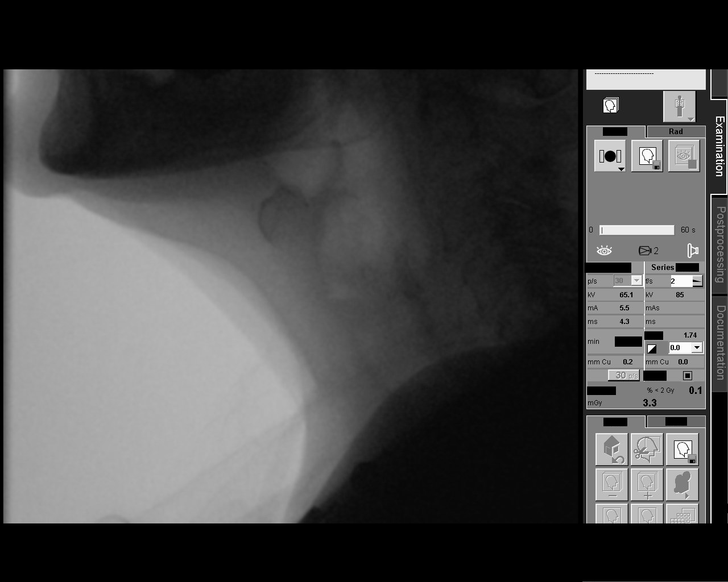

[Series 9: run · 1 of 29 frames shown (3 of 6)]
[frame 15/29]
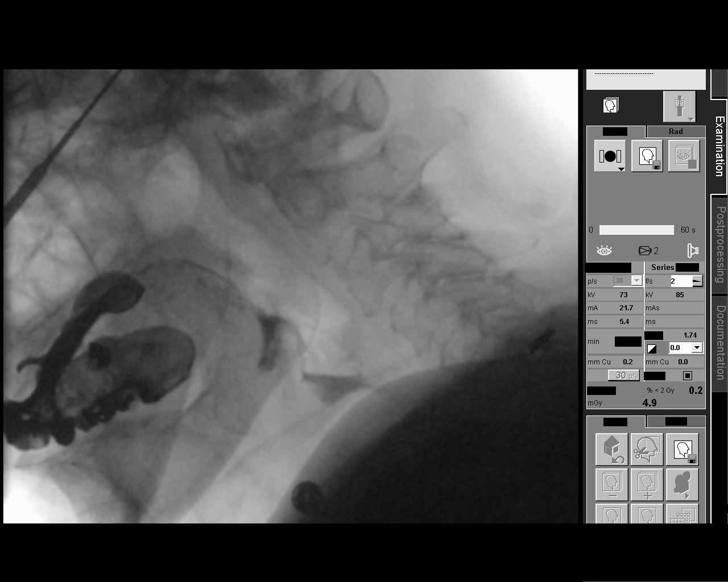

[Series 11: run · 1 of 29 frames shown (4 of 6)]
[frame 15/29]
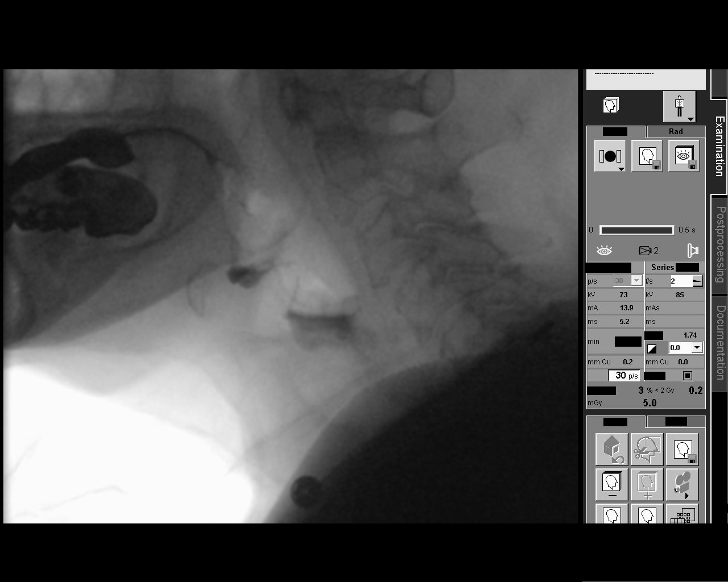

[Series 13: run · 1 of 75 frames shown (5 of 6)]
[frame 38/75]
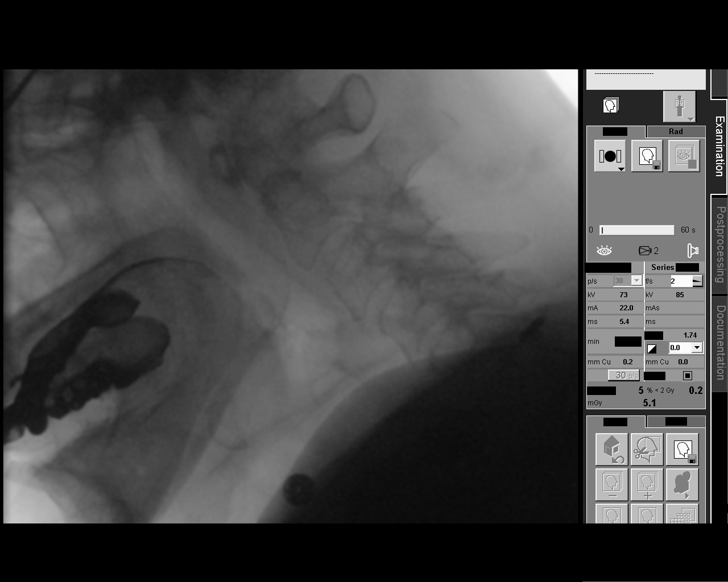

[Series 14: puree · 1 of 384 frames shown]
[frame 193/384]
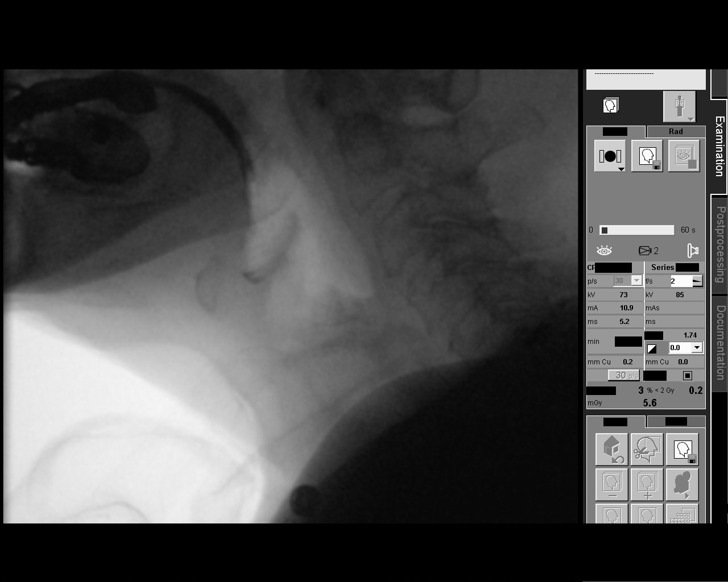

[Series 16: still 2nd bite puree · 1 of 117 frames shown]
[frame 36/117]
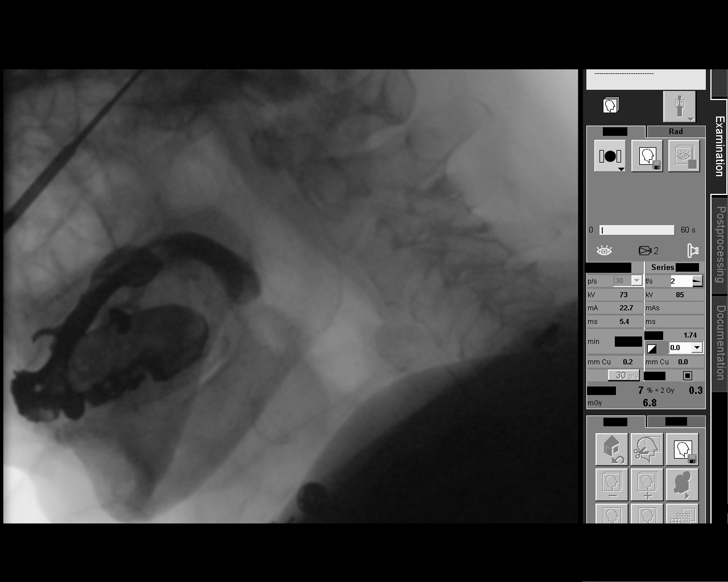

[Series 18: mech soft · 1 of 517 frames shown]
[frame 259/517]
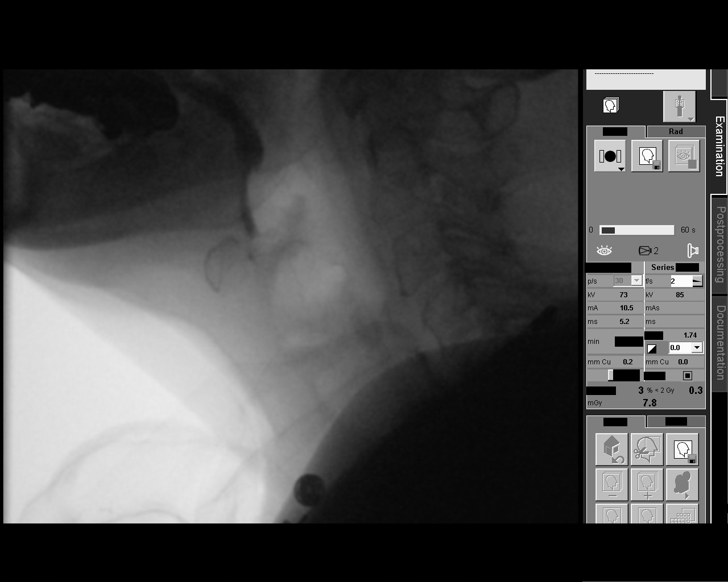

[Series 20: run · 1 of 167 frames shown (6 of 6)]
[frame 142/167]
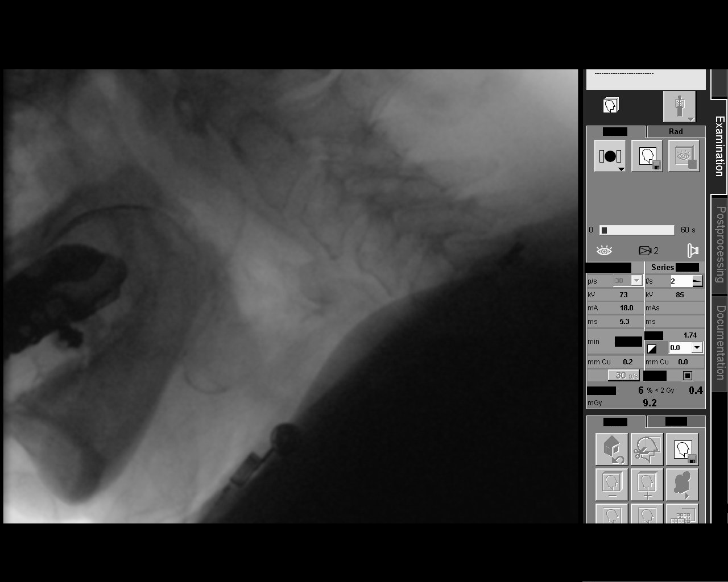

[Series 23: residuals · 1 of 34 frames shown]
[frame 29/34]
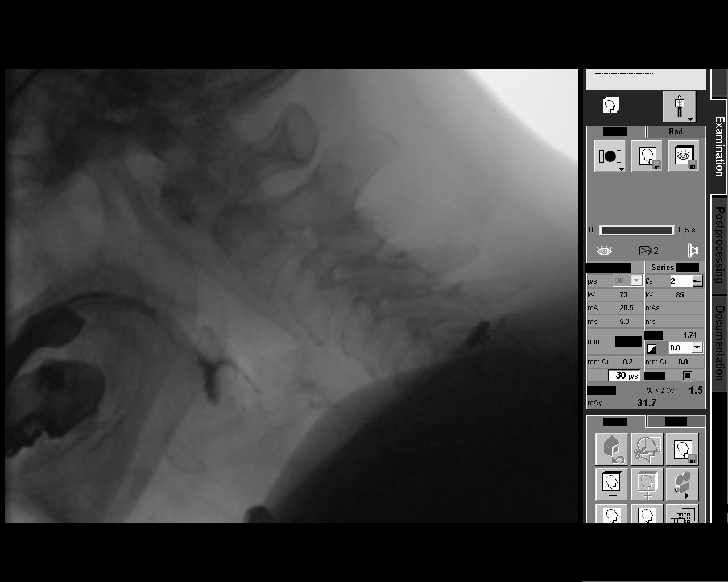

[Series 25: cup ntl · 1 of 469 frames shown]
[frame 399/469]
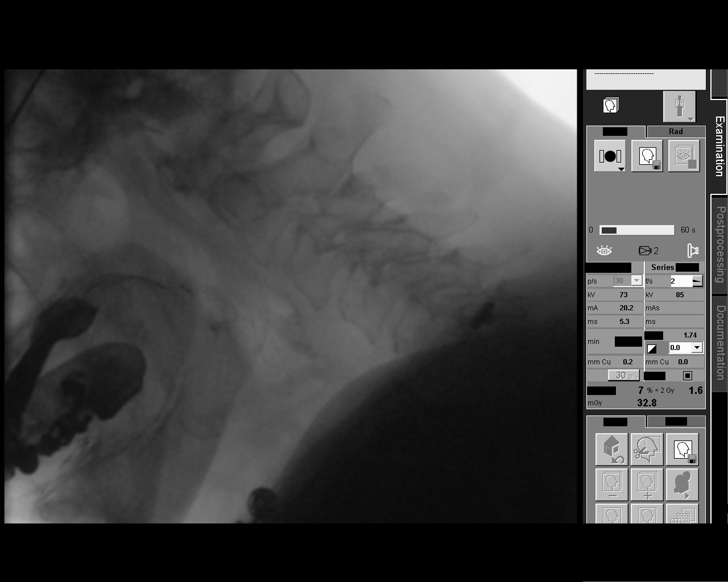

[13 of 24 positions shown; findings below may reference images not displayed]

FLUOROSCOPY FOR SWALLOWING FUNCTION STUDY:
Fluoroscopy was provided for swallowing function study, which was administered by a speech pathologist.  Final results and recommendations from this study are contained within the speech pathology report.

## 2018-08-06 IMAGING — US US EXTREM  UP VENOUS*R*
1 series · 13 of 24 positions shown · non-contrast
Comparison: None.

CLINICAL DATA: Right arm swelling



[Series 1: us extrem up venous*right* · 0.10mm/px · 13 of 51 slices shown]
[im 1/51]
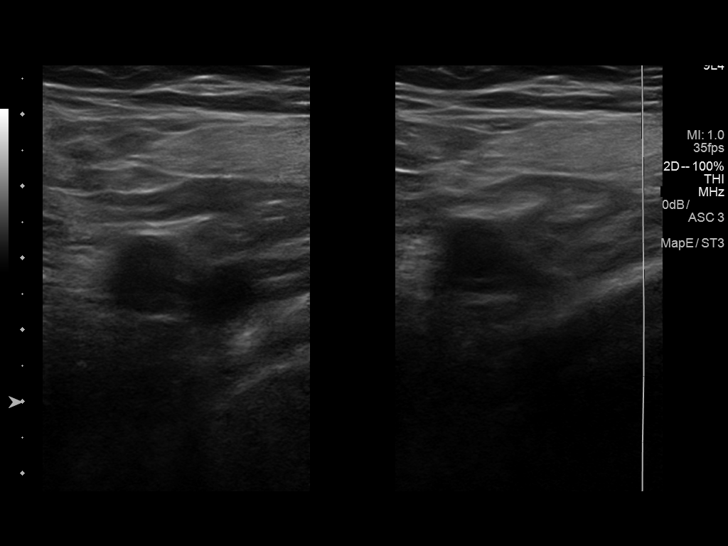
[im 5/51]
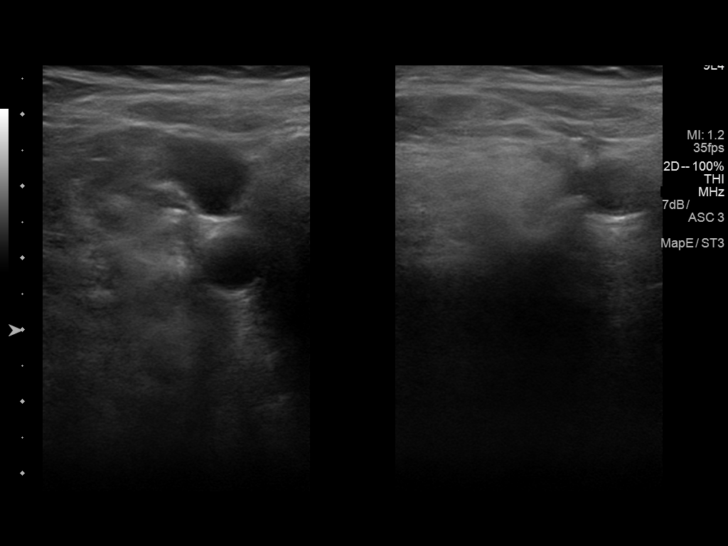
[im 9/51]
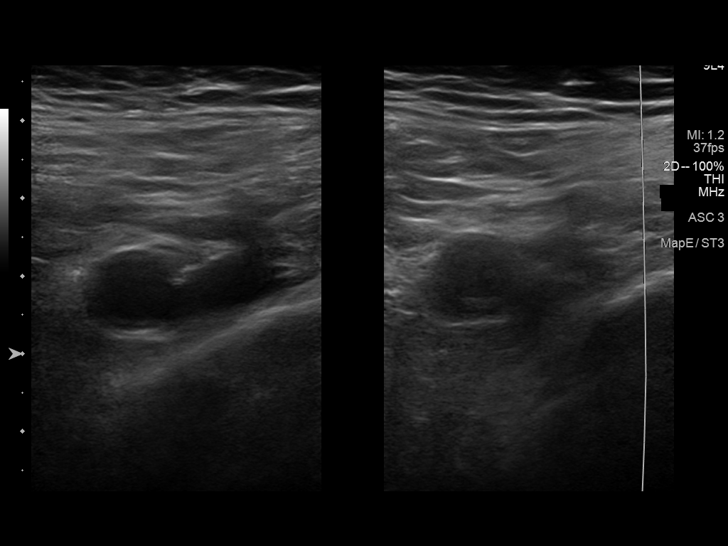
[im 14/51]
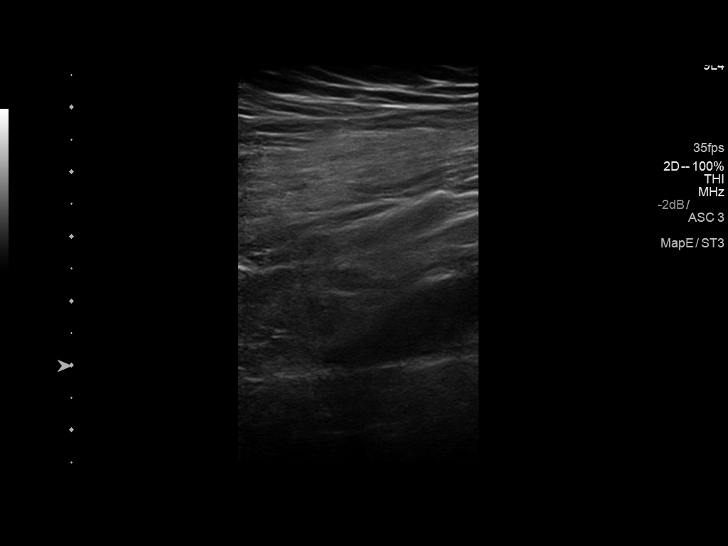
[im 18/51]
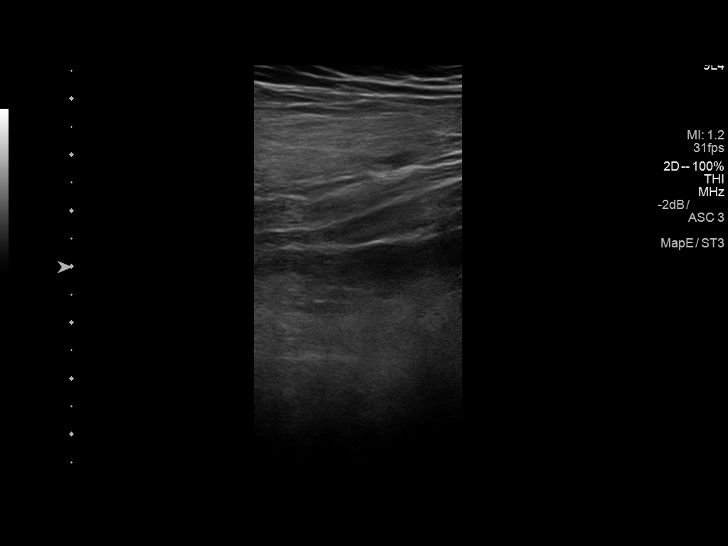
[im 22/51]
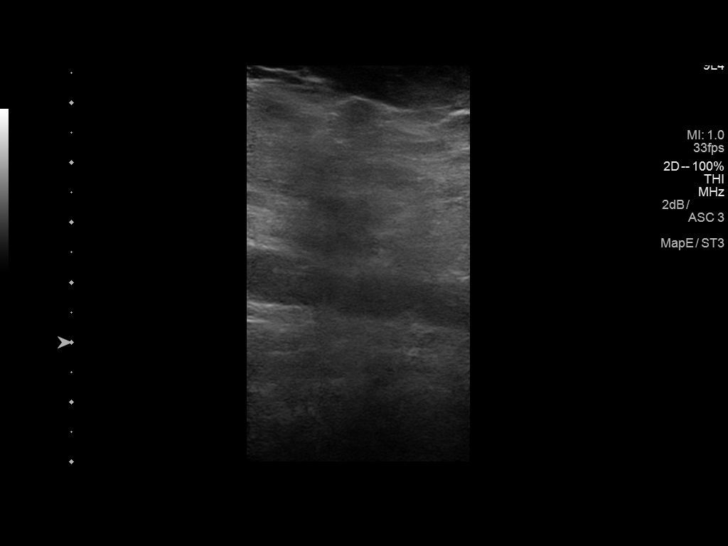
[im 27/51]
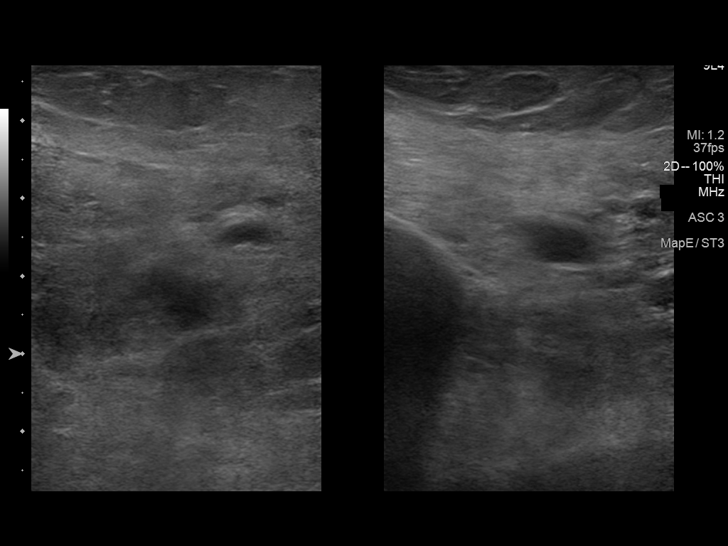
[im 29/51]
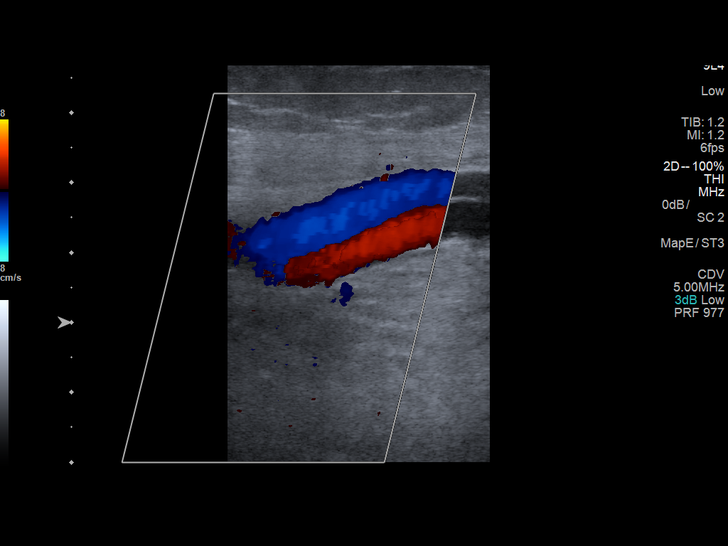
[im 33/51]
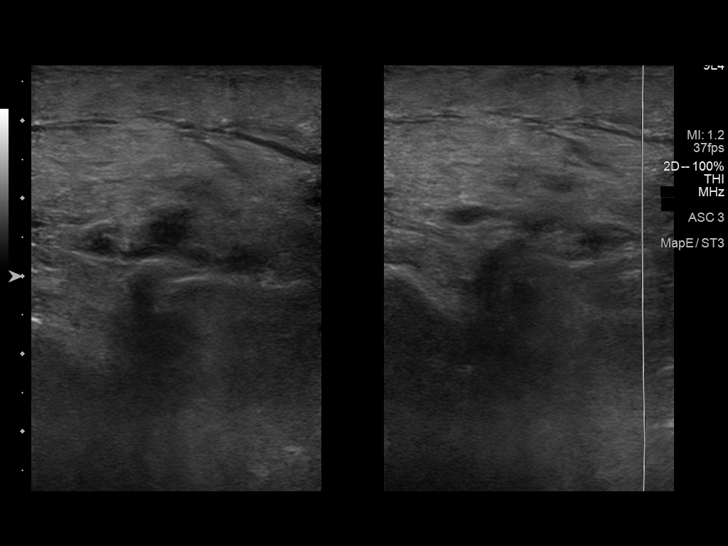
[im 37/51]
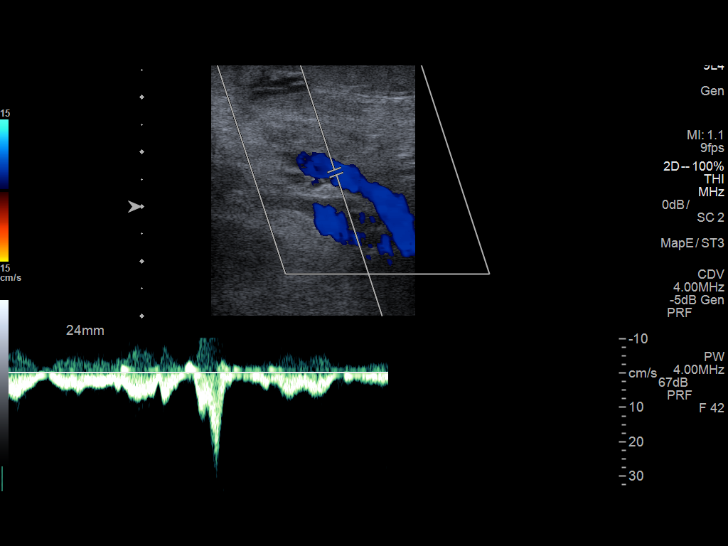
[im 42/51]
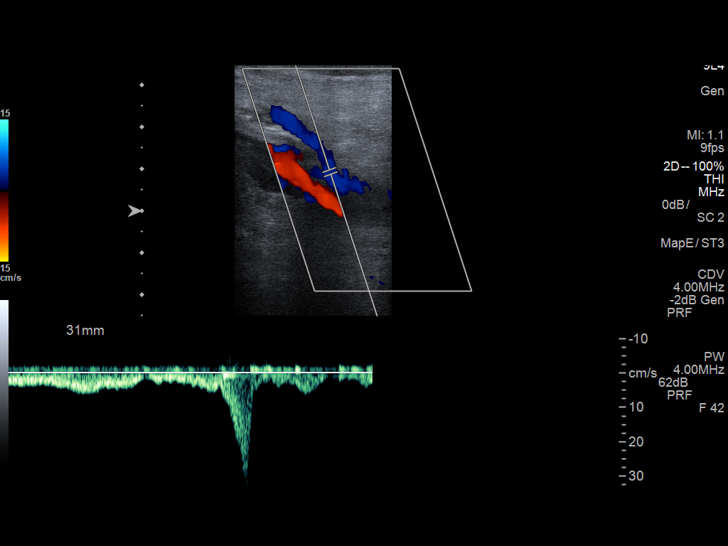
[im 46/51]
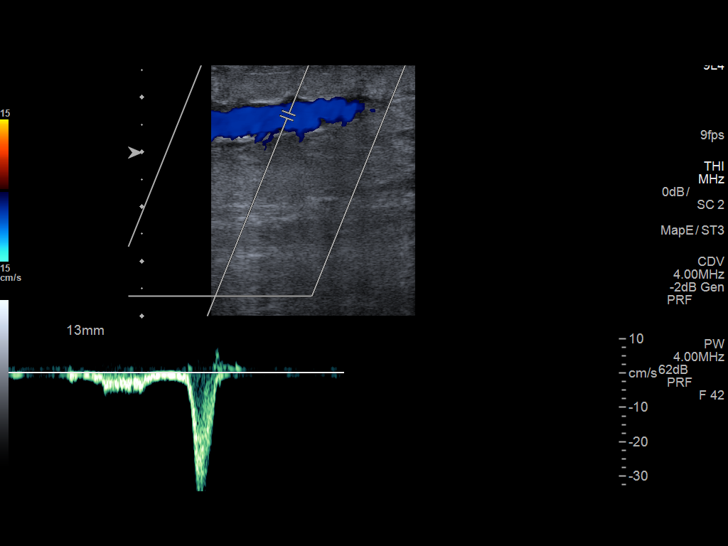
[im 51/51]
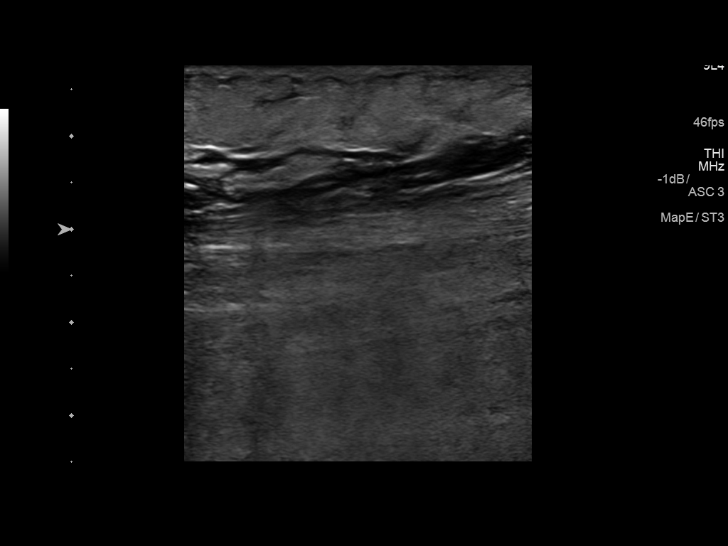

[13 of 24 positions shown; findings below may reference images not displayed]

FINDINGS: Contralateral Subclavian Vein: Respiratory phasicity is normal and
symmetric with the symptomatic side. No evidence of thrombus. Normal
compressibility.

Internal Jugular Vein: No evidence of thrombus. Normal
compressibility, respiratory phasicity and response to augmentation.

Subclavian Vein: No evidence of thrombus. Normal compressibility,
respiratory phasicity and response to augmentation.

Axillary Vein: No evidence of thrombus. Normal compressibility,
respiratory phasicity and response to augmentation.

Cephalic Vein: No evidence of thrombus. Normal compressibility,
respiratory phasicity and response to augmentation.

Basilic Vein: No evidence of thrombus. Normal compressibility,
respiratory phasicity and response to augmentation.

Brachial Veins: No evidence of thrombus. Normal compressibility,
respiratory phasicity and response to augmentation.

Radial Veins: No evidence of thrombus. Normal compressibility,
respiratory phasicity and response to augmentation.

Ulnar Veins: No evidence of thrombus. Normal compressibility,
respiratory phasicity and response to augmentation.

Venous Reflux:  None visualized.

Other Findings:  None visualized.
IMPRESSION: No evidence of deep venous thrombosis.
